# Patient Record
Sex: Female | Born: 1955 | Hispanic: Yes | Marital: Married | Smoking: Former smoker
Health system: Southern US, Community
[De-identification: ages and names within clinical notes are randomized; demographics above are authoritative.]

## PROBLEM LIST (undated history)

## (undated) DIAGNOSIS — C541 Malignant neoplasm of endometrium: Secondary | ICD-10-CM

## (undated) DIAGNOSIS — M1990 Unspecified osteoarthritis, unspecified site: Secondary | ICD-10-CM

## (undated) DIAGNOSIS — R112 Nausea with vomiting, unspecified: Secondary | ICD-10-CM

## (undated) DIAGNOSIS — Z9889 Other specified postprocedural states: Secondary | ICD-10-CM

## (undated) DIAGNOSIS — J45909 Unspecified asthma, uncomplicated: Secondary | ICD-10-CM

## (undated) HISTORY — PX: ABDOMINAL HYSTERECTOMY: SHX81

## (undated) HISTORY — DX: Malignant neoplasm of endometrium: C54.1

## (undated) HISTORY — PX: OTHER SURGICAL HISTORY: SHX169

---

## 2005-01-26 ENCOUNTER — Ambulatory Visit: Payer: Self-pay

## 2007-12-04 ENCOUNTER — Ambulatory Visit: Payer: Self-pay

## 2012-01-23 ENCOUNTER — Ambulatory Visit: Payer: Self-pay

## 2014-08-19 ENCOUNTER — Ambulatory Visit: Payer: Self-pay

## 2014-09-22 ENCOUNTER — Ambulatory Visit: Payer: Self-pay | Admitting: Obstetrics and Gynecology

## 2014-09-23 ENCOUNTER — Ambulatory Visit
Admit: 2014-09-23 | Disposition: A | Discharge status: Home / Self Care | Payer: Self-pay | Attending: Obstetrics and Gynecology | Admitting: Obstetrics and Gynecology

## 2014-09-25 ENCOUNTER — Ambulatory Visit
Admit: 2014-09-25 | Disposition: A | Discharge status: Home / Self Care | Payer: Self-pay | Attending: Obstetrics and Gynecology | Admitting: Obstetrics and Gynecology

## 2014-10-07 ENCOUNTER — Ambulatory Visit
Admit: 2014-10-07 | Disposition: A | Discharge status: Home / Self Care | Payer: Self-pay | Attending: Hematology and Oncology | Admitting: Hematology and Oncology

## 2014-10-28 ENCOUNTER — Other Ambulatory Visit: Payer: Self-pay

## 2014-10-28 ENCOUNTER — Ambulatory Visit
Admission: RE | Admit: 2014-10-28 | Discharge: 2014-10-28 | Disposition: A | Discharge status: Home / Self Care | Payer: Self-pay | Source: Ambulatory Visit | Attending: Obstetrics and Gynecology | Admitting: Obstetrics and Gynecology

## 2014-10-28 ENCOUNTER — Encounter
Admission: RE | Admit: 2014-10-28 | Discharge: 2014-10-28 | Disposition: A | Discharge status: Home / Self Care | Payer: Self-pay | Source: Ambulatory Visit | Attending: Obstetrics and Gynecology | Admitting: Obstetrics and Gynecology

## 2014-10-28 DIAGNOSIS — C541 Malignant neoplasm of endometrium: Secondary | ICD-10-CM | POA: Insufficient documentation

## 2014-10-28 DIAGNOSIS — Z01818 Encounter for other preprocedural examination: Secondary | ICD-10-CM | POA: Insufficient documentation

## 2014-10-28 DIAGNOSIS — Z01812 Encounter for preprocedural laboratory examination: Secondary | ICD-10-CM | POA: Insufficient documentation

## 2014-10-28 HISTORY — DX: Unspecified asthma, uncomplicated: J45.909

## 2014-10-28 HISTORY — DX: Nausea with vomiting, unspecified: R11.2

## 2014-10-28 HISTORY — DX: Other specified postprocedural states: Z98.890

## 2014-10-28 HISTORY — DX: Unspecified osteoarthritis, unspecified site: M19.90

## 2014-10-28 LAB — CBC
HCT: 41.7 % (ref 35.0–47.0)
Hemoglobin: 13.5 g/dL (ref 12.0–16.0)
MCH: 28.6 pg (ref 26.0–34.0)
MCHC: 32.3 g/dL (ref 32.0–36.0)
MCV: 88.3 fL (ref 80.0–100.0)
PLATELETS: 178 10*3/uL (ref 150–440)
RBC: 4.72 MIL/uL (ref 3.80–5.20)
RDW: 14.6 % — AB (ref 11.5–14.5)
WBC: 5.2 10*3/uL (ref 3.6–11.0)

## 2014-10-30 ENCOUNTER — Encounter
Admission: RE | Admit: 2014-10-30 | Discharge: 2014-10-30 | Disposition: A | Discharge status: Home / Self Care | Payer: Self-pay | Source: Ambulatory Visit | Attending: Obstetrics and Gynecology | Admitting: Obstetrics and Gynecology

## 2014-10-30 DIAGNOSIS — C541 Malignant neoplasm of endometrium: Secondary | ICD-10-CM | POA: Insufficient documentation

## 2014-10-30 LAB — BASIC METABOLIC PANEL
Anion gap: 7 (ref 5–15)
BUN: 13 mg/dL (ref 6–20)
CALCIUM: 8.6 mg/dL — AB (ref 8.9–10.3)
CO2: 24 mmol/L (ref 22–32)
Chloride: 107 mmol/L (ref 101–111)
Creatinine, Ser: 0.46 mg/dL (ref 0.44–1.00)
Glucose, Bld: 87 mg/dL (ref 65–99)
Potassium: 3.9 mmol/L (ref 3.5–5.1)
Sodium: 138 mmol/L (ref 135–145)

## 2014-11-02 ENCOUNTER — Inpatient Hospital Stay: Admission: RE | Admit: 2014-11-02 | Payer: Self-pay | Source: Ambulatory Visit

## 2014-11-04 ENCOUNTER — Inpatient Hospital Stay: Payer: Self-pay | Admitting: Certified Registered Nurse Anesthetist

## 2014-11-04 ENCOUNTER — Ambulatory Visit
Admission: RE | Admit: 2014-11-04 | Discharge: 2014-11-04 | DRG: 741 | Disposition: A | Discharge status: Home / Self Care | Payer: Self-pay | Source: Ambulatory Visit | Attending: Obstetrics and Gynecology | Admitting: Obstetrics and Gynecology

## 2014-11-04 ENCOUNTER — Encounter
Admission: RE | Disposition: A | Discharge status: Home / Self Care | Payer: Self-pay | Source: Ambulatory Visit | Attending: Obstetrics and Gynecology

## 2014-11-04 DIAGNOSIS — C541 Malignant neoplasm of endometrium: Secondary | ICD-10-CM | POA: Diagnosis present

## 2014-11-04 DIAGNOSIS — Z90710 Acquired absence of both cervix and uterus: Secondary | ICD-10-CM | POA: Diagnosis not present

## 2014-11-04 HISTORY — PX: LAPAROSCOPIC HYSTERECTOMY: SHX1926

## 2014-11-04 HISTORY — PX: LAPAROSCOPIC BILATERAL SALPINGO OOPHERECTOMY: SHX5890

## 2014-11-04 HISTORY — DX: Malignant neoplasm of endometrium: C54.1

## 2014-11-04 SURGERY — HYSTERECTOMY, TOTAL, LAPAROSCOPIC
Anesthesia: Choice

## 2014-11-04 MED ORDER — PROPOFOL 10 MG/ML IV BOLUS
INTRAVENOUS | Status: DC | PRN
  Administered 2014-11-04: 150 mg via INTRAVENOUS

## 2014-11-04 MED ORDER — FENTANYL CITRATE (PF) 100 MCG/2ML IJ SOLN
25.0000 ug | INTRAMUSCULAR | Status: AC | PRN
  Administered 2014-11-04 (×5): 25 ug via INTRAVENOUS

## 2014-11-04 MED ORDER — PROMETHAZINE HCL 25 MG PO TABS: 25.0000 mg | ORAL_TABLET | Freq: Four times a day (QID) | ORAL | Status: DC | PRN

## 2014-11-04 MED ORDER — FENTANYL CITRATE (PF) 100 MCG/2ML IJ SOLN
INTRAMUSCULAR | Status: AC
  Administered 2014-11-04: 25 ug via INTRAVENOUS
  Filled 2014-11-04: qty 2

## 2014-11-04 MED ORDER — SIMETHICONE 80 MG PO CHEW
80.0000 mg | CHEWABLE_TABLET | Freq: Four times a day (QID) | ORAL | Status: DC | PRN
Start: 2014-11-04 — End: 2014-11-25

## 2014-11-04 MED ORDER — FAMOTIDINE 20 MG PO TABS
20.0000 mg | ORAL_TABLET | Freq: Once | ORAL | Status: AC
  Administered 2014-11-04: 20 mg via ORAL

## 2014-11-04 MED ORDER — CEFAZOLIN SODIUM 1-5 GM-% IV SOLN
INTRAVENOUS | Status: AC
  Filled 2014-11-04: qty 50

## 2014-11-04 MED ORDER — ROCURONIUM BROMIDE 100 MG/10ML IV SOLN
INTRAVENOUS | Status: DC | PRN
  Administered 2014-11-04: 20 mg via INTRAVENOUS
  Administered 2014-11-04: 50 mg via INTRAVENOUS

## 2014-11-04 MED ORDER — HYDROMORPHONE HCL 1 MG/ML IJ SOLN: 0.2500 mg | INTRAMUSCULAR | Status: DC | PRN

## 2014-11-04 MED ORDER — ONDANSETRON HCL 4 MG/2ML IJ SOLN: 4.0000 mg | Freq: Once | INTRAMUSCULAR | Status: DC | PRN

## 2014-11-04 MED ORDER — HYDROMORPHONE HCL 1 MG/ML IJ SOLN
INTRAMUSCULAR | Status: DC | PRN
  Administered 2014-11-04: 1 mg via INTRAVENOUS

## 2014-11-04 MED ORDER — NEOSTIGMINE METHYLSULFATE 10 MG/10ML IV SOLN
INTRAVENOUS | Status: DC | PRN
  Administered 2014-11-04: 4 mg via INTRAVENOUS

## 2014-11-04 MED ORDER — METHYLENE BLUE 1 % INJ SOLN
INTRAMUSCULAR | Status: DC | PRN
  Administered 2014-11-04: 4 mL via SUBMUCOSAL

## 2014-11-04 MED ORDER — FENTANYL CITRATE (PF) 100 MCG/2ML IJ SOLN
INTRAMUSCULAR | Status: AC
  Administered 2014-11-04: 25 ug
  Filled 2014-11-04: qty 2

## 2014-11-04 MED ORDER — KETAMINE HCL 50 MG/ML IJ SOLN
INTRAMUSCULAR | Status: DC | PRN
  Administered 2014-11-04: 50 mg via INTRAMUSCULAR

## 2014-11-04 MED ORDER — MIDAZOLAM HCL 2 MG/2ML IJ SOLN
INTRAMUSCULAR | Status: DC | PRN
  Administered 2014-11-04: 2 mg via INTRAVENOUS

## 2014-11-04 MED ORDER — ACETAMINOPHEN 10 MG/ML IV SOLN
INTRAVENOUS | Status: AC
  Administered 2014-11-04: 1000 mg via INTRAVENOUS
  Filled 2014-11-04: qty 100

## 2014-11-04 MED ORDER — IBUPROFEN 800 MG PO TABS
ORAL_TABLET | ORAL | Status: AC
  Filled 2014-11-04: qty 1

## 2014-11-04 MED ORDER — CEFAZOLIN SODIUM 1-5 GM-% IV SOLN
1.0000 g | INTRAVENOUS | Status: AC
  Administered 2014-11-04: 1 g via INTRAVENOUS

## 2014-11-04 MED ORDER — HYDROCODONE-ACETAMINOPHEN 5-325 MG PO TABS: 1.0000 | ORAL_TABLET | Freq: Four times a day (QID) | ORAL | Status: DC | PRN

## 2014-11-04 MED ORDER — FAMOTIDINE 20 MG PO TABS
ORAL_TABLET | ORAL | Status: AC
  Administered 2014-11-04: 20 mg via ORAL
  Filled 2014-11-04: qty 1

## 2014-11-04 MED ORDER — FENTANYL CITRATE (PF) 100 MCG/2ML IJ SOLN
INTRAMUSCULAR | Status: DC | PRN
  Administered 2014-11-04: 150 ug via INTRAVENOUS
  Administered 2014-11-04: 100 ug via INTRAVENOUS

## 2014-11-04 MED ORDER — LIDOCAINE HCL (CARDIAC) 20 MG/ML IV SOLN
INTRAVENOUS | Status: DC | PRN
  Administered 2014-11-04: 100 mg via INTRAVENOUS

## 2014-11-04 MED ORDER — ONDANSETRON HCL 4 MG/2ML IJ SOLN
INTRAMUSCULAR | Status: DC | PRN
  Administered 2014-11-04: 4 mg via INTRAVENOUS

## 2014-11-04 MED ORDER — METHYLENE BLUE 1 % INJ SOLN
INTRAMUSCULAR | Status: AC
  Filled 2014-11-04: qty 10

## 2014-11-04 MED ORDER — IBUPROFEN 800 MG PO TABS
800.0000 mg | ORAL_TABLET | Freq: Once | ORAL | Status: AC
  Administered 2014-11-04: 800 mg via ORAL

## 2014-11-04 MED ORDER — LACTATED RINGERS IV SOLN
INTRAVENOUS | Status: DC
  Administered 2014-11-04 (×3): via INTRAVENOUS

## 2014-11-04 MED ORDER — IBUPROFEN 800 MG PO TABS: 800.0000 mg | ORAL_TABLET | Freq: Three times a day (TID) | ORAL | Status: DC | PRN

## 2014-11-04 MED ORDER — GLYCOPYRROLATE 0.2 MG/ML IJ SOLN
INTRAMUSCULAR | Status: DC | PRN
  Administered 2014-11-04: .7 mg via INTRAVENOUS

## 2014-11-04 MED ORDER — DEXAMETHASONE SODIUM PHOSPHATE 4 MG/ML IJ SOLN
INTRAMUSCULAR | Status: DC | PRN
  Administered 2014-11-04: 5 mg via INTRAVENOUS

## 2014-11-04 SURGICAL SUPPLY — 88 items
APPLICATOR SURGIFLO (MISCELLANEOUS) IMPLANT
BAG URO DRAIN 2000ML W/SPOUT (MISCELLANEOUS) ×4 IMPLANT
BLADE SURG 15 STRL SS SAFETY (BLADE) IMPLANT
BLADE SURG SZ11 CARB STEEL (BLADE) ×4 IMPLANT
CANISTER SUCT 1200ML W/VALVE (MISCELLANEOUS) ×4 IMPLANT
CANNULA DILATOR  5MM W/SLV (CANNULA) ×2
CANNULA DILATOR 5 W/SLV (CANNULA) ×6 IMPLANT
CATH FOLEY 2WAY  5CC 16FR (CATHETERS) ×2
CATH TRAY 16F METER LATEX (MISCELLANEOUS) IMPLANT
CATH URTH 16FR FL 2W BLN LF (CATHETERS) ×2 IMPLANT
CHLORAPREP W/TINT 26ML (MISCELLANEOUS) ×4 IMPLANT
CNTNR SPEC 2.5X3XGRAD LEK (MISCELLANEOUS) ×2
CONT SPEC 4OZ STER OR WHT (MISCELLANEOUS) ×2
CONTAINER SPEC 2.5X3XGRAD LEK (MISCELLANEOUS) ×2 IMPLANT
CORD MONOPOLAR M/FML 12FT (MISCELLANEOUS) ×4 IMPLANT
DEFOGGER SCOPE WARMER CLEARIFY (MISCELLANEOUS) ×4 IMPLANT
DEVICE SUTURE ENDOST 10MM (ENDOMECHANICALS) ×4 IMPLANT
DRAPE LAP W/FLUID (DRAPES) ×4 IMPLANT
DRAPE LAPAROTOMY 100X77 ABD (DRAPES) ×4 IMPLANT
DRAPE LAPAROTOMY TRNSV 106X77 (MISCELLANEOUS) IMPLANT
DRAPE PERI LITHO V/GYN (MISCELLANEOUS) IMPLANT
DRAPE UNDER BUTTOCK W/FLU (DRAPES) ×4 IMPLANT
DRSG TEGADERM 2-3/8X2-3/4 SM (GAUZE/BANDAGES/DRESSINGS) ×16 IMPLANT
DRSG TELFA 3X8 NADH (GAUZE/BANDAGES/DRESSINGS) ×4 IMPLANT
ELECT BLADE 6 FLAT ULTRCLN (ELECTRODE) IMPLANT
ELECT CAUTERY BLADE 6.4 (BLADE) IMPLANT
GAUZE SPONGE 4X4 12PLY STRL (GAUZE/BANDAGES/DRESSINGS) IMPLANT
GAUZE SPONGE NON-WVN 2X2 STRL (MISCELLANEOUS) IMPLANT
GLOVE BIO SURGEON STRL SZ8 (GLOVE) ×16 IMPLANT
GLOVE INDICATOR 8.0 STRL GRN (GLOVE) ×12 IMPLANT
GOWN STRL REUS W/ TWL LRG LVL3 (GOWN DISPOSABLE) ×4 IMPLANT
GOWN STRL REUS W/ TWL XL LVL3 (GOWN DISPOSABLE) ×2 IMPLANT
GOWN STRL REUS W/TWL LRG LVL3 (GOWN DISPOSABLE) ×4
GOWN STRL REUS W/TWL XL LVL3 (GOWN DISPOSABLE) ×2
GRASPER SUT TROCAR 14GX15 (MISCELLANEOUS) ×4 IMPLANT
IRRIGATION STRYKERFLOW (MISCELLANEOUS) ×2 IMPLANT
IRRIGATOR STRYKERFLOW (MISCELLANEOUS) ×4
IV LACTATED RINGERS 1000ML (IV SOLUTION) IMPLANT
KIT RM TURNOVER CYSTO AR (KITS) ×4 IMPLANT
LABEL OR SOLS (LABEL) IMPLANT
LIGASURE BLUNT 5MM 37CM (INSTRUMENTS) ×4 IMPLANT
LIGASURE IMPACT 36 18CM CVD LR (INSTRUMENTS) ×4 IMPLANT
LIQUID BAND (GAUZE/BANDAGES/DRESSINGS) ×4 IMPLANT
MANIPULATOR VCARE LG CRV RETR (MISCELLANEOUS) IMPLANT
MANIPULATOR VCARE STD CRV RETR (MISCELLANEOUS) ×4 IMPLANT
NDL INSUFF 14G 150MM VS150000 (NEEDLE) ×4 IMPLANT
NDL INSUFF ACCESS 14 VERSASTEP (NEEDLE) ×4 IMPLANT
NEEDLE SPNL 22GX5 LNG QUINC BK (NEEDLE) ×4 IMPLANT
NEEDLE VERESS 14GA 120MM (NEEDLE) ×4 IMPLANT
NS IRRIG 1000ML POUR BTL (IV SOLUTION) IMPLANT
NS IRRIG 500ML POUR BTL (IV SOLUTION) ×4 IMPLANT
OCCLUDER COLPOPNEUMO (BALLOONS) ×4 IMPLANT
PACK BASIN MAJOR ARMC (MISCELLANEOUS) IMPLANT
PACK GYN LAPAROSCOPIC (MISCELLANEOUS) ×4 IMPLANT
PAD GROUND ADULT SPLIT (MISCELLANEOUS) ×4 IMPLANT
PAD OB MATERNITY 4.3X12.25 (PERSONAL CARE ITEMS) ×4 IMPLANT
PAD PREP 24X41 OB/GYN DISP (PERSONAL CARE ITEMS) ×4 IMPLANT
PAD TRENDELENBURG OR TABLE (MISCELLANEOUS) ×4 IMPLANT
SCISSORS METZENBAUM CVD 33 (INSTRUMENTS) ×4 IMPLANT
SET CYSTO W/LG BORE CLAMP LF (SET/KITS/TRAYS/PACK) IMPLANT
SHEARS ENDO 5MM LNG  ASK BEFOR (MISCELLANEOUS) ×2
SHEARS ENDO 5MM LNG ASK BEFOR (MISCELLANEOUS) ×2 IMPLANT
SLEEVE ENDOPATH XCEL 5M (ENDOMECHANICALS) ×4 IMPLANT
SPOGE SURGIFLO 8M (HEMOSTASIS)
SPONGE LAP 18X18 5 PK (GAUZE/BANDAGES/DRESSINGS) IMPLANT
SPONGE LAP 4X18 5PK (MISCELLANEOUS) IMPLANT
SPONGE SURGIFLO 8M (HEMOSTASIS) IMPLANT
SPONGE VERSALON 2X2 STRL (MISCELLANEOUS)
SPONGE XRAY 4X4 16PLY STRL (MISCELLANEOUS) ×4 IMPLANT
STAPLER SKIN PROX 35W (STAPLE) IMPLANT
SUT ENDO VLOC 180-0-8IN (SUTURE) ×8 IMPLANT
SUT MAXON ABS #0 GS21 30IN (SUTURE) IMPLANT
SUT PDS AB 1 TP1 96 (SUTURE) IMPLANT
SUT PLAIN 2 0 XLH (SUTURE) IMPLANT
SUT VIC AB 0 CT1 27 (SUTURE)
SUT VIC AB 0 CT1 27XCR 8 STRN (SUTURE) IMPLANT
SUT VIC AB 0 CT1 36 (SUTURE) IMPLANT
SUT VIC AB 2-0 UR6 27 (SUTURE) ×4 IMPLANT
SUT VICRYL AB 3-0 FS1 BRD 27IN (SUTURE) IMPLANT
SYR 3ML LL SCALE MARK (SYRINGE) IMPLANT
SYR 50ML LL SCALE MARK (SYRINGE) ×4 IMPLANT
SYR BULB IRRIG 60ML STRL (SYRINGE) ×4 IMPLANT
SYRINGE 10CC LL (SYRINGE) ×8 IMPLANT
TRAY PREP VAG/GEN (MISCELLANEOUS) ×4 IMPLANT
TROCAR ENDO BLADELESS 11MM (ENDOMECHANICALS) ×4 IMPLANT
TROCAR VERSASTEP 12M LG PL (TROCAR) ×4 IMPLANT
TROCAR XCEL NON-BLD 5MMX100MML (ENDOMECHANICALS) ×4 IMPLANT
TUBING INSUFFLATOR HEATED (MISCELLANEOUS) ×4 IMPLANT

## 2014-11-04 NOTE — Anesthesia Preprocedure Evaluation (Addendum)
Anesthesia Evaluation    Airway Mallampati: II       Dental   Pulmonary former smoker,          Cardiovascular Rhythm:Regular     Neuro/Psych    GI/Hepatic   Endo/Other    Renal/GU      Musculoskeletal   Abdominal   Peds  Hematology   Anesthesia Other Findings   Reproductive/Obstetrics                            Anesthesia Physical Anesthesia Plan  ASA: II  Anesthesia Plan: General   Post-op Pain Management:    Induction: Intravenous  Airway Management Planned: Oral ETT  Additional Equipment:   Intra-op Plan:   Post-operative Plan:   Informed Consent: I have reviewed the patients History and Physical, chart, labs and discussed the procedure including the risks, benefits and alternatives for the proposed anesthesia with the patient or authorized representative who has indicated his/her understanding and acceptance.     Plan Discussed with: CRNA  Anesthesia Plan Comments:         Anesthesia Quick Evaluation

## 2014-11-04 NOTE — Anesthesia Postprocedure Evaluation (Signed)
  Anesthesia Post-op Note  Patient: Elizabeth Davenport  Procedure(s) Performed: Procedure(s): HYSTERECTOMY TOTAL LAPAROSCOPIC (N/A) LAPAROSCOPIC BILATERAL SALPINGO OOPHORECTOMY (Bilateral)  Anesthesia type:No value filed.  Patient location: PACU  Post pain: Pain level controlled  Post assessment: Post-op Vital signs reviewed, Patient's Cardiovascular Status Stable, Respiratory Function Stable, Patent Airway and No signs of Nausea or vomiting  Post vital signs: Reviewed and stable  Last Vitals:  Filed Vitals:   11/04/14 1427  BP: 144/80  Pulse: 74  Temp:   Resp: 16    Level of consciousness: awake, alert  and patient cooperative  Complications: No apparent anesthesia complications

## 2014-11-04 NOTE — H&P (Signed)
59 year old G76P2 Hispanic female with two month history of light post menopausal bleeding. 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  CT scan 09/22/14 - negative for metastatic disease.  Cervix normal on exam.   Pathology reviewed at [facility] and was felt to represent a well-differentiated adenocarcinoma FIGO grade 1. There was no evidence of cervical adenocarcinoma in situ or squamous dysplasia identified. ER positive/PR negative and patchy staining for p16. In situ hybridization studies were negative for HPV subtypes 16 and 18. The final assessment favored an endometrial origin.  The patient is a Sales promotion account executive Witness and absolutely refuses transfusion; she is willing to accept clotting factors such as FloSeal to control bleeding as well as albumin and cryoprecipitate.  I had a long discussion with the patient and her daughter with an interpreter about the small potential risk of life threatening bleeding.  She affirms that she would rather die than receive a life saving blood transfusion.  I told them that we would likely not do sentinel lymph node sampling as there is no evidence that this improves the chance of cure of the cancer, and the nodes are located in close approximation to the iliac blood vessels.   Pre-op CBC and BMP are acceptable for surgery and she is not anemic at baseline.  Plan is for TLH/BSO possible SLN mapping and biopsy and the patient is in agreement with proceeding. Mellody Drown, MD

## 2014-11-04 NOTE — Transfer of Care (Signed)
Immediate Anesthesia Transfer of Care Note  Patient: Elizabeth Davenport  Procedure(s) Performed: Procedure(s): HYSTERECTOMY TOTAL LAPAROSCOPIC (N/A) LAPAROSCOPIC BILATERAL SALPINGO OOPHORECTOMY (Bilateral)  Patient Location: PACU  Anesthesia Type:General  Level of Consciousness: awake, alert  and oriented  Airway & Oxygen Therapy: Patient Spontanous Breathing and Patient connected to nasal cannula oxygen  Post-op Assessment: Report given to RN and Post -op Vital signs reviewed and stable  Post vital signs: Reviewed and stable  Last Vitals:  Filed Vitals:   11/04/14 1426  BP: 144/80  Pulse:   Temp: 36.6 C  Resp: 18    Complications: No apparent anesthesia complications

## 2014-11-04 NOTE — Op Note (Signed)
DATE: 11/04/14  PRE-OP DIAGNOSIS: Grade 1 endometrial cancer   POST-OP DIAGNOSIS: Grade 1 endometrial cancer  SURGEON: Rubie Maid, MD  ASSISTANT: Mellody Drown, MD  ANESTHESIA: general ET  PROCEDURE: Total laparoscopic hysterectomy, bilateral salpingoophorectomy, pelvic washings   ESTIMATED BLOOD LOSS: 50 ml  DRAINS: none  TOTAL IV FLUIDS: per Anesthesia  SPECIMENS: uterus, tubes and ovaries, pelvic washings  COMPLICATIONS: none  DISPOSITION: discharge to home  CONDITION: stable  INDICATIONS: Grade 1 endometrial cancer  FINDINGS: There was a small anterior uterine fibroid.  The left tube and ovary were normal.  The right ovary had a 7 mm surface excrescence concerning for metastatic disease.  The right tube was normal.  Survey of the rest of the abdomen, including the omentum and right diaphragm was negative. Lymphatic mapping was negative on the left, and on the right a channel was seen but no blue nodes.  There were no pathologically enlarged nodes.  In view of her being a Jehovah's witness, we did not perform a lymph node dissection, and this had been discussed with the patient and her daughter prior to surgery.      PROCEDURE IN DETAIL: After informed consent was obtained, the patient was taken to the operating room where general endotracheal anesthesia was performed without difficulty. The patient was positioned in the dorsal lithotomy position in [place] and the usual precautions taken with her arms tucked bilaterally. She was prepped and draped in normal sterile fashion. Time-out was performed and a Foley catheter was placed into the bladder.  Two ml of methylene blue was placed in the cervix at 3 and 9 o'clock for sentinel lymph node mapping.  A standard V-Care uterine manipulator was placed without incident and a pneum-occluder balloon was also placed.  An open Hasson technique was used to place an infraumbilical 29-UT baloon trocar under direct visualization. The  laparoscope was introduced and CO2 gas was infused for pneumoperitoneum to a pressure of 15 mm Hg.  Right and left lateral 5-mm ports and a 5-12 mm suprapubic port were placed under direct visualization of the laparoscope using an EndoStep technique.  Cytologic washings were obtained.  The patient was placed in Trendelenburg and the bowel was displaced up into the upper abdomen.  Round ligaments were then divided on each side with the Ligasure and the retroperitoneal space was opened bilaterally. The ureters were identified and preserved. The infundibulopelvic ligaments were skeletonized and divided with the Ligasure. The bladder flap was created with electrocautery and the bladder was dissected down off the lower uterine segment and cervix. The uterine arteries were then skeletonized bilaterally and secured and divided with the Ligasure and dropped down below the level of the Vcare cup.  The endoshears were then used to transect the vagina on top of the rim of the V-care device.  The vaginal cuff was then closed with the Endostitch using a running closure with the V lock suture.  A few sponges were placed in the abdomen to mop up blood and then were removed.  The intraperitoneal pressure was dropped and all pedicles felt to be hemostatic. The suprapubic port was then closed with the [employer] device using a figure of eight of 0 Vicryl.  The trochars were removed and the umbilical port site was closed with a figure of eight of 2-0 Vicryl.  The skin of all four port sites was closed with glue.    The patient tolerated the procedure well. Sponge, lap, needle and instrument counts were correct x2  at the end of the procedure.  She was awakened and extubated and taken to the recovery room in good condition.  Dr Marcelline Mates and I both were involved in the entire case.  Mellody Drown, MD

## 2014-11-04 NOTE — Discharge Instructions (Addendum)
?  1) Follow up in 1 week at [facility] for incision check, 4 weeks at [facility].  2) Seek medical attention for persistent bright red bleeding, fever greater than 100.4 degrees, severe abdominal/pelvic pain, dysuria, nausea or vomiting, or foul smellnig vaginal discharge.  3) There is a possibility of having light vaginal bleeding or discharge for 2-3 weeks.  4) Pelvic rest: avoid anything in the vagina (sex/douching/tampons) for 3 weeks.

## 2014-11-08 ENCOUNTER — Encounter: Payer: Self-pay | Admitting: Obstetrics and Gynecology

## 2014-11-10 LAB — CYTOLOGY - NON PAP

## 2014-11-10 LAB — SURGICAL PATHOLOGY

## 2014-11-25 ENCOUNTER — Encounter (INDEPENDENT_AMBULATORY_CARE_PROVIDER_SITE_OTHER): Payer: Self-pay

## 2014-11-25 ENCOUNTER — Inpatient Hospital Stay: Payer: Self-pay | Attending: Obstetrics and Gynecology | Admitting: Obstetrics and Gynecology

## 2014-11-25 ENCOUNTER — Inpatient Hospital Stay (HOSPITAL_BASED_OUTPATIENT_CLINIC_OR_DEPARTMENT_OTHER): Payer: Self-pay | Admitting: Oncology

## 2014-11-25 VITALS — BP 125/91 | HR 74 | Temp 97.0°F | Resp 18 | Ht 62.0 in | Wt 200.0 lb

## 2014-11-25 DIAGNOSIS — Z87891 Personal history of nicotine dependence: Secondary | ICD-10-CM

## 2014-11-25 DIAGNOSIS — Z90710 Acquired absence of both cervix and uterus: Secondary | ICD-10-CM

## 2014-11-25 DIAGNOSIS — N839 Noninflammatory disorder of ovary, fallopian tube and broad ligament, unspecified: Secondary | ICD-10-CM

## 2014-11-25 DIAGNOSIS — M129 Arthropathy, unspecified: Secondary | ICD-10-CM | POA: Insufficient documentation

## 2014-11-25 DIAGNOSIS — C541 Malignant neoplasm of endometrium: Secondary | ICD-10-CM | POA: Insufficient documentation

## 2014-11-25 DIAGNOSIS — C562 Malignant neoplasm of left ovary: Secondary | ICD-10-CM

## 2014-11-25 DIAGNOSIS — J45909 Unspecified asthma, uncomplicated: Secondary | ICD-10-CM

## 2014-11-25 DIAGNOSIS — C569 Malignant neoplasm of unspecified ovary: Secondary | ICD-10-CM | POA: Insufficient documentation

## 2014-11-25 DIAGNOSIS — Z79899 Other long term (current) drug therapy: Secondary | ICD-10-CM | POA: Insufficient documentation

## 2014-11-25 DIAGNOSIS — C561 Malignant neoplasm of right ovary: Secondary | ICD-10-CM

## 2014-11-25 DIAGNOSIS — C563 Malignant neoplasm of bilateral ovaries: Secondary | ICD-10-CM

## 2014-11-25 NOTE — Patient Instructions (Addendum)
No intercourse or heavy lifting for two months after surgery.  You will be on lifting restrictions for two months.

## 2014-11-25 NOTE — Progress Notes (Signed)
Gynecologic Oncology Interval Note  Chief Complaint: Post op visit for uterine and ovarian cancer.  Subjective:   She has no complaints today.  Normal GI/GU function. Accompanied by daughter and interpreter.  Oncology history: Elizabeth Davenport is a 59 y.o. G2P2 Hispanic woman who developed light vaginal bleeding in early 2016 after undergoing LMP/menopause in 2014. Dr DeFrancesco did PAP that was normal.  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  CT scan 09/22/14 abd/pelvis negative for metastatic disease.  She was referred to [place] and on exam the cervix was normal.  Slides were reviewed at [facility] and read as probable endometrial primary.  She underwent TLH/BSO Nov 04, 2014 at [facility].  Right ovarian excresence noted at surgery.  Survey of the rest of the abdomen, including the omentum and right diaphragm was negative.  Further staging not performed due to patient's refusal to accept blood products as she is a Jehovah's witness.  Path showed 2.6 cm grade 1 endometrial cancer without LVSI or cervical involvement.  The ovaries both were found to have low grade serous cancers of 7 and 8 mm.  No spread noted, but washings were positive.  UTERUS, CERVIX, BILATERAL FALLOPIAN TUBES AND OVARIES; TOTAL  LAPAROSCOPIC HYSTERECTOMY AND BILATERAL SALPINGO-OOPHORECTOMY:  - WELL-DIFFERENTIATED ENDOMETRIOID CARCINOMA (FIGO I).  - BILATERAL OVARIES WITH LOW-GRADE SEROUS CARCINOMA.  - FALLOPIAN TUBES NEGATIVE FOR MALIGNANCY.  UTERINE TUMOR  Histologic Type:  Endometrioid adenocarcinoma, not otherwise  characterized  Histologic Grade:  FIGO grade 1  Tumor Size: 2.6cm  Myometrial Invasion:   Present  Depth of Myometrial Invasion: 33mm  Myometrial Thickness (mm):  55mm  Involvement of Cervix:  Not involved  Lymph-Vascular Invasion: Not identified   OVARIAN TUMOR Histologic Type:  Serous, carcinoma  Histologic Grade  World Health Organization  [facility]) Grading System:  G2: Moderately differentiated  Two-Tier Grading System: Low grade  Tumor Size:  Right Ovary Tumor Size. Greatest dimension (cm) 0.8cm  Left Ovary Tumor Size. Greatest dimension (cm) 0.7cm  Ovarian Surface Involvement: Present  Lymph-Vascular Invasion: Not identified   Oncology History:  Problem List: Patient Active Problem List   Diagnosis Date Noted  . Endometrial cancer 11/04/2014  . S/P laparoscopic hysterectomy 11/04/2014    Past Medical History: Past Medical History  Diagnosis Date  . Asthma   . Arthritis     left knee  . PONV (postoperative nausea and vomiting)     Past Surgical History: Past Surgical History  Procedure Laterality Date  . Removed mass from thyriod    . Laparoscopic hysterectomy N/A 11/04/2014    Procedure: HYSTERECTOMY TOTAL LAPAROSCOPIC;  Surgeon: Mellody Drown, MD;  Location: [facility] ORS;  Service: Gynecology;  Laterality: N/A;  . Laparoscopic bilateral salpingo oopherectomy Bilateral 11/04/2014    Procedure: LAPAROSCOPIC BILATERAL SALPINGO OOPHORECTOMY;  Surgeon: Mellody Drown, MD;  Location: [facility] ORS;  Service: Gynecology;  Laterality: Bilateral;    Family History: No family history on file.  Social History: History   Social History  . Marital Status: Married    Spouse Name: N/A  . Number of Children: N/A  . Years of Education: N/A   Occupational History  . Not on file.   Social History Main Topics  . Smoking status: Former Smoker    Quit date: 06/29/1990  . Smokeless tobacco: Never Used  . Alcohol Use: Yes     Comment: once a year  . Drug Use: No  . Sexual Activity: Not on file   Other Topics  Concern  . Not on file   Social History Narrative    Allergies: No Known Allergies  Current Medications: Current Outpatient Prescriptions  Medication Sig Dispense Refill  . albuterol (PROVENTIL HFA;VENTOLIN HFA) 108 (90 BASE) MCG/ACT inhaler Inhale 2 puffs into the lungs every 6 (six) hours as needed for  wheezing or shortness of breath.    Marland Kitchen HYDROcodone-acetaminophen (NORCO) 5-325 MG per tablet Take 1-2 tablets by mouth every 6 (six) hours as needed for moderate pain. 30 tablet 0  . ibuprofen (ADVIL,MOTRIN) 800 MG tablet Take 1 tablet (800 mg total) by mouth every 8 (eight) hours as needed. 30 tablet 0  . promethazine (PHENERGAN) 25 MG tablet Take 1 tablet (25 mg total) by mouth every 6 (six) hours as needed for nausea or vomiting. 30 tablet 0  . simethicone (GAS-X) 80 MG chewable tablet Chew 1 tablet (80 mg total) by mouth 4 (four) times daily as needed for flatulence. 60 tablet 1   No current facility-administered medications for this visit.    Review of Systems Pertinent items are noted in HPI.  Objective:  Physical Examination:  BP 125/91 mmHg  Pulse 74  Temp(Src) 97 F (36.1 C) (Tympanic)  Resp 18  Ht 5\' 2"  (1.575 m)  Wt 200 lb (90.719 kg)  BMI 36.57 kg/m2  SpO2 100%  LMP 11/04/2011   General appearance: alert, cooperative and appears stated age   Abdomen: laparoscopy incisions well healed.   Pelvic: deferred  Lab Review:   No results found for: CA125 Lab Results  Component Value Date   WBC 5.2 10/28/2014   HGB 13.5 10/28/2014   HCT 41.7 10/28/2014   MCV 88.3 10/28/2014   PLT 178 10/28/2014   Lab Results  Component Value Date   NA 138 10/30/2014   K 3.9 10/30/2014   CL 107 10/30/2014   CO2 24 10/30/2014   Lab Results  Component Value Date   CREATININE 0.46 10/30/2014   Lab Results  Component Value Date   BUN 13 10/30/2014   No results found for: ALT, AST, GGT, ALKPHOS, BILITOT  Radiologic Imaging:  No procedure found.  Performance status 0 - Asymptomatic  Assessment:  Elizabeth Davenport is a 59 y.o. 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  Plan:   Problem List Items Addressed This Visit    Endometrial cancer - Primary    Ovarian cancer, bilateral      In view of the path report suggesting primary endometrial and uterine cancers I would like to have the pathology reviewed at [place] to confirm this, as it would be much more common to have uterine cancer metastatic to the ovaries than dual primaries.  If there are two primaries, the uterine cancer would not require additional therapy, but for the low grade serous ovarian cancer we would recommend a course of carboplatin/taxol followed by maintainence hormonal therapy with an aromatase inhibitor.  If she has metastatic endometrial cancer in the ovaries, we would likely recommend a combination of carbo/taxol and radiation.   Suggested return to clinic in  3 weeks to see Dr Oliva Bustard and I for consideration of adjuvant therapy.   Based on final pathology reading from [facility] we may want to consider genetic testing.  Instructions: no heavy lifting and no intercourse for 2 months post op.   Mellody Drown, MD  CC:  Dr. Rubie Maid, Dr Forest Gleason

## 2014-11-27 ENCOUNTER — Encounter: Payer: Self-pay | Admitting: Oncology

## 2014-11-27 NOTE — Progress Notes (Signed)
[place] @ [facility] [phone]  Fax:([phone]) [place] OB: 10-01-1955  MR#: [id number]  IZT#:[id number]  Patient Care Team: No Pcp Per Patient as PCP - General (General Practice)  CHIEF COMPLAINT: No chief complaint on file.   VISIT DIAGNOSIS:  No diagnosis found.   Oncology History   She has no complaints today. Normal GI/GU function. Accompanied by daughter and interpreter.  Oncology history: Elizabeth Davenport is a 60 y.o. G2P2 Hispanic woman who developed light vaginal bleeding in early 2016 after undergoing LMP/menopause in 2014. Dr DeFrancesco did PAP that was normal. 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 CT scan 09/22/14 abd/pelvis negative for metastatic disease. She was referred to [facility] and on exam the cervix was normal. Slides were reviewed at [facility] and read as probable endometrial primary. She underwent TLH/BSO Nov 04, 2014 at [facility]. Right ovarian excresence noted at surgery. Survey of the rest of the abdomen, including the omentum and right diaphragm was negative. Further staging not performed due to patient's refusal to accept blood products as she is a Jehovah's witness. Path showed 2.6 cm grade 1 endometrial cancer without LVSI or cervical involvement. The ovaries both were found to have low grade serous cancers of 7 and 8 mm. No spread noted, but washings were positive.  UTERUS, CERVIX, BILATERAL FALLOPIAN TUBES AND OVARIES; TOTAL  LAPAROSCOPIC HYSTERECTOMY AND BILATERAL SALPINGO-OOPHORECTOMY:  - WELL-DIFFERENTIATED ENDOMETRIOID CARCINOMA (FIGO I).  - BILATERAL OVARIES WITH LOW-GRADE SEROUS CARCINOMA.  - FALLOPIAN TUBES NEGATIVE FOR MALIGNANCY.  UTERINE TUMOR  Histologic Type:  Endometrioid adenocarcinoma, not otherwise  characterized  Histologic Grade:  FIGO grade 1  Tumor Size: 2.6cm  Myometrial Invasion:   Present  Depth of Myometrial Invasion:  37mm  Myometrial Thickness (mm):  21mm  Involvement of Cervix:  Not involved  Lymph-Vascular Invasion: Not identified   OVARIAN TUMOR Histologic Type:  Serous, carcinoma  Histologic Grade  World Health Organization [facility]) Grading System:  G2: Moderately differentiated  Two-Tier Grading System: Low grade  Tumor Size:  Right Ovary Tumor Size. Greatest dimension (cm) 0.8cm  Left Ovary Tumor Size. Greatest dimension (cm) 0.7cm  Ovarian Surface Involvement: Present  Lymph-Vascular Invasion: Not identified   Oncology History:  Problem List: Patient Active Problem List   Diagnosis Date Noted  . Endometrial cancer 11/04/2014  . S/P laparoscopic hysterectomy 11/04/2014            Ovarian cancer, bilateral   11/25/2014 Initial Diagnosis Ovarian cancer, bilateral    Oncology Flowsheet 11/04/2014  dexamethasone (DECADRON) IJ -  ondansetron (ZOFRAN) IV -    INTERVAL HISTORY:  59 year old lady came today for further follow-up regarding carcinoma of ovary, bilateral with positive washings and endometrial cancer.  Patient underwent bilateral oophorectomy and hysterectomy. Patient is also being seen by her GYN oncologist No abdominal pain.  No nausea.  No vomiting.  No diarrhea. History has been obtained through interpreter  REVIEW OF SYSTEMS:    GENERAL:  Feels good.  Active.  No fevers, sweats or weight loss. PERFORMANCE STATUS (ECOG):  0 HEENT:  No visual changes, runny nose, sore throat, mouth sores or tenderness. Lungs: No shortness of breath or cough.  No hemoptysis. Cardiac:  No chest pain, palpitations, orthopnea, or PND. GI:  No nausea, vomiting, diarrhea, constipation, melena or hematochezia. GU:  No urgency, frequency, dysuria, or hematuria. Musculoskeletal:  No back pain.  No joint pain.  No muscle tenderness. Extremities:  No pain  or swelling. Skin:  No rashes or skin changes. Neuro:  No headache, numbness or weakness, balance or coordination  issues. Endocrine:  No diabetes, thyroid issues, hot flashes or night sweats. Psych:  No mood changes, depression or anxiety. Pain:  No focal pain. Review of systems:  All other systems reviewed and found to be negative. As per HPI. Otherwise, a complete review of systems is negatve.  PAST MEDICAL HISTORY: Past Medical History  Diagnosis Date  . Asthma   . Arthritis     left knee  . PONV (postoperative nausea and vomiting)   . Endometrial cancer 11/04/14  . Ovarian cancer 11/04/14    low grade serous    PAST SURGICAL HISTORY: Past Surgical History  Procedure Laterality Date  . Removed mass from thyriod    . Laparoscopic hysterectomy N/A 11/04/2014    Procedure: HYSTERECTOMY TOTAL LAPAROSCOPIC;  Surgeon: Mellody Drown, MD;  Location: [facility] ORS;  Service: Gynecology;  Laterality: N/A;  . Laparoscopic bilateral salpingo oopherectomy Bilateral 11/04/2014    Procedure: LAPAROSCOPIC BILATERAL SALPINGO OOPHORECTOMY;  Surgeon: Mellody Drown, MD;  Location: [facility] ORS;  Service: Gynecology;  Laterality: Bilateral;    FAMILY HISTORY Family History  Problem Relation Age of Onset  . Breast cancer Sister 93        ADVANCED DIRECTIVES: Patient does not have any advanced healthcare directive. Information has been given.   HEALTH MAINTENANCE: History  Substance Use Topics  . Smoking status: Former Smoker -- 8 years    Quit date: 06/29/1990  . Smokeless tobacco: Never Used     Comment: Smoking History Cigarettes 1 cig per day; quit 22 years ago; smoked for 8 years  . Alcohol Use: 0.0 oz/week    0 Standard drinks or equivalent per week     Comment: once a year    :  No Known Allergies  Current Outpatient Prescriptions  Medication Sig Dispense Refill  . albuterol (PROVENTIL HFA;VENTOLIN HFA) 108 (90 BASE) MCG/ACT inhaler Inhale 2 puffs into the lungs every 6 (six) hours as needed for wheezing or shortness of breath.     No current facility-administered medications for this  visit.    OBJECTIVE: PHYSICAL EXAM: General  status: Performance status is good.  Patient has not lost significant weight HEENT: No evidence of stomatitis. Sclera and conjunctivae :: No jaundice.   pale looking. Lungs: Air  entry equal on both sides.  No rhonchi.  No rales.  Cardiac: Heart sounds are normal.  No pericardial rub.  No murmur. Lymphatic system: Cervical, axillary, inguinal, lymph nodes not palpable GI: Abdomen is soft.  No ascites.  Liver spleen not palpable.  No tenderness.  Bowel sounds are within normal limit Abdominal wound is healed. Lower extremity: No edema Neurological system: Higher functions, cranial nerves intact no evidence of peripheral neuropathy. Skin: No rash.  No ecchymosis..  There were no vitals filed for this visit.   There is no weight on file to calculate BMI.    ECOG FS:0 - Asymptomatic  LAB RESULTS:  No visits with results within 2 Day(s) from this visit. Latest known visit with results is:  Admission on 11/04/2014, Discharged on 11/04/2014  Component Date Value Ref Range Status  . CYTOLOGY - NON GYN 11/04/2014    Final                   Value:Cytology - Non PAP CASE: [id number] PATIENT: Elizabeth Davenport Non-Gyn Cytology Report     SPECIMEN SUBMITTED: A. Washing, pelvic  CLINICAL  HISTORY: None provided  PRE-OPERATIVE DIAGNOSIS: Endometrial Cancer; Endometrial  Cancer  POST-OPERATIVE DIAGNOSIS:      DIAGNOSIS: A. PELVIC WASHINGS; THINPREP, CYTOSPIN AND CELL BLOCK: - POSITIVE FOR MALIGNANCY, CARCINOMA PRESENT.  Note This interpretation includes evaluation of a ThinPrep slide, cell block and separately submitted specimen [id number].   GROSS DESCRIPTION:  A. Specimen Labeled: pelvic washings Volume: approximate 50 mL Description: cloudy yellow Cell block(s): 1 and ThinPrep Final Diagnosis performed by Delorse Lek, MD.  Electronically signed 11/10/2014 12:33:32PM    The electronic signature indicates that the named  Attending Pathologist has evaluated the specimen  Technical component performed at [facility], [address] [place], [place] [address] Lab: [phone] Dir: Darrick Penna. Evette Doffing, MD                           Professional component performed at [facility], [facility], [place], [place],  [address] Lab: [phone] Dir: Dellia Nims. Reuel Derby, MD    . SURGICAL PATHOLOGY 11/04/2014    Final                   Value:Surgical Pathology CASE: [id number] PATIENT: Elizabeth Davenport Surgical Pathology Report     SPECIMEN SUBMITTED: A. Uterus, cervix, Bilateral tubes and ovaries  CLINICAL HISTORY: None provided  PRE-OPERATIVE DIAGNOSIS: Endometrial cancer  POST-OPERATIVE DIAGNOSIS: Endometrial cancer     DIAGNOSIS: A. UTERUS, CERVIX, BILATERAL FALLOPIAN TUBES AND OVARIES; TOTAL LAPAROSCOPIC HYSTERECTOMY AND BILATERAL SALPINGO-OOPHORECTOMY: - WELL-DIFFERENTIATED ENDOMETRIOID CARCINOMA (FIGO I). - BILATERAL OVARIES WITH LOW-GRADE SEROUS CARCINOMA. - FALLOPIAN TUBES NEGATIVE FOR MALIGNANCY. - LEIOMYOMATA WITH PARTIAL HYALINIZATION, UP TO 2.8 CM WITHOUT NECROSIS, ATYPIA OR INCREASED MITOSES. - SEE CANCER CASE SUMMARIES BELOW.  ENDOMETRIUM: Hysterectomy, With or Without Other Organs or Tissues Specimens InvolvedA: Uterus, cervix, Bilateral tubes and ovaries  Endometrium, Hysterectomy, With or Without Other Organs or Tissues Cancer Case Summary Specimen: Uterine corpus SPE                         CIMEN Procedure Simple hysterectomy Additional Procedures:   Bilateral salpingo-oophorectomy Lymph Node Sampling:     Not performed Specimen Integrity: Intact hysterectomy specimen TUMOR Histologic Type:    Endometrioid adenocarcinoma, not otherwise characterized Histologic Grade:   FIGO grade 1 EXTENT Tumor Size:    Greatest dimension (cm) 2.6cm Myometrial Invasion:     Present Depth of Myometrial Invasion: Specify depth of invasion (mm) 65mm Myometrial  Thickness (mm):    9mm Involvement of Cervix:   Not involved Other Organs Submitted:  Right ovary Not involved Left ovary Not involved Right fallopian tube Not involved Left fallopian tube Not involved ACCESSORY FINDINGS Lymph-Vascular Invasion: Not identified Not identified STAGE (pTNM [FIGO]) TNM Descriptors:    n/a Primary Tumor (pT): pT1a [IA]:  Tumor limited to endometrium or invades less than one-half of the myometrium Regional Lymph Nodes (pN) pNX: Cannot be assessed Pelvic Lymph Nodes: No pelv                         ic nodes submitted or found Para-aortic Lymph Nodes: No para-aortic nodes submitted or found Distant Metastasis (pM): Not applicable n/a   OVARY: Oophorectomy, Salpingo-Oophorectomy, Subtotal Oophorectomy or Removal of Tumor in Fragments, Hysterectomy with Salpingo-Oophorectomy Specimens InvolvedA: Uterus, cervix, Bilateral tubes and ovaries  Ovary, Oophorectomy, Salpingo-Oophorectomy, Subtotal Oophorectomy or Removal of Tumor in Fragments, Hysterectomy with Salpingo-Oophorectomy Cancer Case Summary Specimen: Ovary SPECIMEN Procedure:  Bilateral salpingo-oophorectomy Lymph Node Sampling:     Not performed Specimen Integrity Right ovary:   Capsule intact Left ovary:    Capsule intact Primary Tumor Site: Bilateral ovarian TUMOR Histologic Type:    Serous, carcinoma Histologic Grade World Health Organization (WHO) Grading System:   G2: Moderately differentiated Two-Tier Grading System: Low grade EXTENT Tumor Size:    Right Ovary Tumor Size Greatest dimensio                         n (cm) 0.8cm Left Ovary Tumor Size Greatest dimension (cm) 0.7cm Ovarian Surface Involvement:  Present Organs Submitted:   Right ovary Involved Left ovary Involved Right fallopian tube Not involved Left fallopian tube Not involved Uterus Not involved Cervix Not involved ACCESSORY FINDINGS Lymph-Vascular Invasion: Not identified STAGE (pTNM  [FIGO]) TNM Descriptors:    m (multiple primary tumors) Primary Tumor (pT): pT1c [IC]: Tumor limited to one or both ovaries with any of the following: capsule ruptured, tumor on ovarian surface, malignant cells in ascites or peritoneal washings Regional Lymph Nodes (pN) pNX: Cannot be assessed No nodes submitted or found Distant Metastasis (pM): Not applicable n/a   Note Pelvic washings were separately submitted as [id number] and are positive for carcinoma. The results of this case were discussed with Dr. Fransisca Connors on 11/10/14 by Dr. Luana Shu.   GROSS DESCRIPTION:  A. Labeled: uterus, cervix, bilateral tubes and ova                         ries Adnexa: bilateral tubes and ovaries attached Weight: 122 grams Shape: received intact, misshapened and the anterior aspect and there is a 0.3 x 0.3 x 0.1 cm slightly raised nodule on the posterior fundus Dimensions:      Height: 6.2 cm      Anterior to posterior: 4.7 Cm      Breadth at fundus: 6.3 cm Serosa: purple tan with a subserosal nodule at the fundus Cervix: Ectocervix: 3.2 x 3.0 cm with a 0.5 cm os Endocervix: trabecular tan Length of endocervical canal: 3.1 cm  Endometrium:      Length of endometrial cavity: 3.4 cm      Width of endometrial cavity at fundus: 2.5 cm      Tumor findings:           Dimensions: 2.6 x 1.5 x 0.5 cm           Appearance: friable tan to red           Location and extent: anterior endometrium, 3.6 cm from the external os      Myometrial invasion: 0.3 cm, less than one third  Thickness of myometrial wall at deepest gross invasion: 1.4 cm  Other findings and comments: and anterior 2.8 x 2.8 x 2.5 cm intramural nodule and                          multiple smaller white whorled nodules  Adnexa:      Right ovary:           Dimensions: grossly intact 2.5 x 1.8 x 1.3 cm           External surface: lobulated pink-tan with a 1.1 x 0.8 x 0.6 cm soft tan excrescence           Cut surface: heterogeneous  purple tan      Right fallopian tube:  Dimensions: 4.6 cm long by 1 cm in diameter           Other findings: fimbriated with multiple paratubal cyst      Left ovary:           Dimensions: intact, 2.4 x 2.0 x 1.1 cm           External surface: lobulated pink-tan with a 0.6 x 0.5 x 0.1 cm soft tan excrescence           Cut surface: heterogeneous pink-tan      Left fallopian tube:           Dimensions: fimbriated, 5.2 cm long by 0.9 cm in diameter           Other findings: fimbriated  Lymph nodes: not grossly identified  Other comments: uterus inked blue on anterior and black on posterior aspect and ovaries inked blue  Block summary: 1-2-anterior cervix and lower uterine segment bisected 3-4-posterior cer                         vix and lower uterine segment bisected 5-8-representative anterior endomyometrium from upper corpus toward fundus respectively 9-10-representative posterior endomyometrium and entire slightly raised nodule 11-representative largest white whorled nodule anterior aspect 12-13-representative right ovary with entire excrescent area 14-representative right fallopian tube cross-sections 15-right fallopian tube fimbriated and bisected 16-representative left fallopian tube cross-sections 17-left fallopian tube fimbriated end bisected 18-representative left ovary with entire excrescent area 19-21-remaining anterior endometrial tumor 22-remaining right fallopian tube 23-remaining left fallopian Final Diagnosis performed by Delorse Lek, MD.  Electronically signed 11/10/2014 1:21:56PM    The electronic signature indicates that the named Attending Pathologist has evaluated the specimen  Technical component performed at [facility], [address] [place], [place] [address] Lab: [phone]                         [place]: Darrick Penna. Evette Doffing, MD  Professional component performed at [facility], [facility], [place], [place], [place]  [address] Lab: [phone] Dir: Dellia Nims. Rubinas, MD       STUDIES: Dg Chest 2 View  10/29/2014   CLINICAL DATA:  Endometrial carcinoma  EXAM: CHEST  2 VIEW  COMPARISON:  None.  FINDINGS: Normal heart size. Right perihilar subsegmental atelectasis. Otherwise clear lungs. No pneumothorax or pleural effusion.  IMPRESSION: No active cardiopulmonary disease.   Electronically Signed   By: Marybelle Killings M.D.   On: 10/29/2014 08:16    ASSESSMENT:  Carcinoma of endometrium stage I Carcinoma over the bilateral with positive pelvic washing stage I  PLAN: \ As discussed with GYN oncologist Review pathology at [facility] Depending on that decide whether patient needs carboplatinum Taxol plus minus radiation. Question to be answered by pathologist I will be dealing with 2 primaries Already have carcinoma of endometrium metastases to both ovaries Patient will come back and see [place] in 3 weeks to decide on further treatment options   Patient expressed understanding and was in agreement with this plan. She also understands that She can call clinic at any time with any questions, concerns, or complaints.    Endometrial cancer   Staging form: Corpus Uteri - Carcinoma, AJCC 7th Edition     Clinical: Stage I (T1, N0, M0) - Unsigned Ovarian cancer, bilateral   Staging form: Ovary, AJCC 7th Edition     Clinical: Stage Unknown (T1c, NX, M0) - Signed by Forest Gleason,  MD on 11/27/2014   Forest Gleason, MD   11/27/2014 7:51 AM

## 2014-12-16 ENCOUNTER — Ambulatory Visit: Payer: Self-pay

## 2014-12-22 ENCOUNTER — Telehealth: Payer: Self-pay | Admitting: *Deleted

## 2014-12-22 NOTE — Telephone Encounter (Signed)
Final pathology has been reviewed by [facility].  Per Md. "Findings were most consistent with an endometrioid carcinoma of the endometrium and separate serous borderline tumors involving the surface of both ovaries." Based on this report, Dr. Fransisca Connors "is not recommending any additional chemotherapy or treatment." Dr. Oliva Bustard was made aware.

## 2014-12-23 NOTE — Telephone Encounter (Signed)
-----   Message from [place] sent at 12/23/2014  8:42 AM EDT ----- Regarding: RE: need pt to come next week to see Dr. Fransisca Connors. 7/6 @ 10:30  Thanks  ----- Message -----    From: Sabino Gasser, RN    Sent: 12/22/2014   9:15 AM      To: [place] Subject: need pt to come next week to see Dr. Eldred Manges  Please schedule patient to see Dr. Fransisca Connors next week. Please schedule an interpreter as well.  I will try to call the patient today to let her know an update on the final pathology.  Can you let me know the time of the appointment and then I will give this information to her all @ one time. Thanks.-Guido Comp J

## 2014-12-30 ENCOUNTER — Encounter: Payer: Self-pay | Admitting: Obstetrics and Gynecology

## 2014-12-30 ENCOUNTER — Inpatient Hospital Stay: Payer: Self-pay | Attending: Obstetrics and Gynecology | Admitting: Obstetrics and Gynecology

## 2014-12-30 VITALS — BP 130/82 | HR 68 | Temp 97.8°F | Resp 18 | Ht 62.0 in | Wt 205.0 lb

## 2014-12-30 DIAGNOSIS — C541 Malignant neoplasm of endometrium: Secondary | ICD-10-CM

## 2014-12-30 DIAGNOSIS — C563 Malignant neoplasm of bilateral ovaries: Secondary | ICD-10-CM

## 2014-12-30 DIAGNOSIS — Z9889 Other specified postprocedural states: Secondary | ICD-10-CM

## 2014-12-30 DIAGNOSIS — C561 Malignant neoplasm of right ovary: Secondary | ICD-10-CM

## 2014-12-30 DIAGNOSIS — C562 Malignant neoplasm of left ovary: Secondary | ICD-10-CM

## 2014-12-30 NOTE — Progress Notes (Signed)
Gynecologic Oncology Interval Note  Chief Complaint: Post op visit for uterine cancer and bilateral ovarian borderline tumor excrescences.  Subjective:   She has no complaints today.  Normal GI/GU function. Accompanied by interpreter.  In view of the path report suggesting primary endometrial and uterine cancers I has the pathology reviewed at [facility].  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  However, the ovarian excresences that were read as low grade serous carcinoma were interpreted as benign serous borderline tumors by the [facility] Pathologists (several of them reviewed the case).   A. Uterus, bilateral ovaries and fallopian tubes, hysterectomy and bilateral salpingo-oophorectomy (Outside slide review, [id number], [facility], [place], [place]. Date of procedure 11-04-14):  Carcinoma of the endometrium:  Histologic type: Endometrioid adenocarcinoma.  FIGO grade: 1 of 3.  Tumor size: 2.6 cm, per report.  Maximum depth of myometrial invasion: 0.2 cm, in a 1.4 cm thick wall.  Lymphatic/vascular invasion: Not identified. Remaining myometrium: Leiomyomata. Cervix: Free of tumor. Serosa: Free of tumor. Specimen margins: not involved.  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 Fallopian tubes, right and left: Free of tumor. Lymph nodes: none identified in this specimen. Note: The tumors in the endometrium and the ovaries are morphologically distinct, with extensive papillary architecture in the serous borderline tumors. The endometrial tumor is positive for vimentin, negative for WT-1, focally positive for ER, and negative for PR, consistent with an endometrioid endometrial carcinoma. The ovarian tumor has a similar profile. It is somewhat unusual for a serous borderline tumor to be negative for WT-1, but when the morphologic and immunohistochemical findings  are considered together, we feel the ovarian tumors are best interpreted as primary serous borderline tumors, distinct from the endometrial tumor. The pathologic stage for the endometrial tumor is pT1a Nx Mx. The pathologic stage for the ovarian serous borderline tumors is pT1c Nx Mx. Key parts of this case were reviewed with Dr. Telford Nab and Dr. Maxie Better.  Oncology history: Elizabeth Davenport is a 59 y.o. G2P2 Hispanic woman who developed light vaginal bleeding in early 2016 after undergoing LMP/menopause in 2014. Dr DeFrancesco did PAP that was normal.  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  CT scan 09/22/14 abd/pelvis negative for metastatic disease.  She was referred to [place] and on exam the cervix was normal.  Slides were reviewed at [facility] and read as probable endometrial primary.  She underwent TLH/BSO Nov 04, 2014 at [facility].  Right ovarian excresence noted at surgery.  Survey of the rest of the abdomen, including the omentum and right diaphragm was negative.  Further staging not performed due to patient's refusal to accept blood products as she is a Jehovah's witness.  Path showed 2.6 cm grade 1 endometrial cancer without LVSI or cervical involvement.  The ovaries both were found to have low grade serous cancers of 7 and 8 mm.  No spread noted, but washings were positive.  [facility] Path report UTERUS, CERVIX, BILATERAL FALLOPIAN TUBES AND OVARIES; TOTAL LAPAROSCOPIC HYSTERECTOMY AND BILATERAL SALPINGO-OOPHORECTOMY: - WELL-DIFFERENTIATED ENDOMETRIOID CARCINOMA (FIGO I).  - BILATERAL OVARIES WITH LOW-GRADE SEROUS CARCINOMA. - FALLOPIAN TUBES NEGATIVE FOR MALIGNANCY.  UTERINE TUMOR  Histologic Type:  Endometrioid adenocarcinoma, not otherwise  characterized  Histologic Grade:  FIGO grade 1  Tumor Size: 2.6cm  Myometrial Invasion:   Present  Depth of Myometrial Invasion: 32mm  Myometrial Thickness (mm):  70mm  Involvement of Cervix:  Not  involved  Lymph-Vascular Invasion: Not identified   OVARIAN TUMOR Histologic Type:  Serous, carcinoma  Histologic Grade  [facility]) Grading System:  G2: Moderately differentiated  Two-Tier Grading System: Low grade  Tumor Size:  Right Ovary Tumor Size. Greatest dimension (cm) 0.8cm  Left Ovary Tumor Size. Greatest dimension (cm) 0.7cm  Ovarian Surface Involvement: Present  Lymph-Vascular Invasion: Not identified   Oncology History:  Problem List: Patient Active Problem List   Diagnosis Date Noted  . Ovarian cancer, bilateral 11/25/2014  . Endometrial cancer 11/04/2014  . S/P laparoscopic hysterectomy 11/04/2014    Past Medical History: Past Medical History  Diagnosis Date  . Asthma   . Arthritis     left knee  . PONV (postoperative nausea and vomiting)   . Endometrial cancer 11/04/14  . Ovarian cancer 11/04/14    low grade serous    Past Surgical History: Past Surgical History  Procedure Laterality Date  . Removed mass from thyriod    . Laparoscopic hysterectomy N/A 11/04/2014    Procedure: HYSTERECTOMY TOTAL LAPAROSCOPIC;  Surgeon: Mellody Drown, MD;  Location: [facility] ORS;  Service: Gynecology;  Laterality: N/A;  . Laparoscopic bilateral salpingo oopherectomy Bilateral 11/04/2014    Procedure: LAPAROSCOPIC BILATERAL SALPINGO OOPHORECTOMY;  Surgeon: Mellody Drown, MD;  Location: [facility] ORS;  Service: Gynecology;  Laterality: Bilateral;    Family History: Family History  Problem Relation Age of Onset  . Breast cancer Sister 40    Social History: History   Social History  . Marital Status: Married    Spouse Name: N/A  . Number of Children: N/A  . Years of Education: N/A   Occupational History  . Not on file.   Social History Main Topics  . Smoking status: Former Smoker -- 8 years    Quit date: 06/29/1990  . Smokeless tobacco: Never Used     Comment: Smoking History Cigarettes 1 cig per day; quit 22 years ago; smoked for 8 years   . Alcohol Use: 0.0 oz/week    0 Standard drinks or equivalent per week     Comment: once a year  . Drug Use: No  . Sexual Activity: Not on file   Other Topics Concern  . Not on file   Social History Narrative    Allergies: No Known Allergies  Current Medications: Current Outpatient Prescriptions  Medication Sig Dispense Refill  . albuterol (PROVENTIL HFA;VENTOLIN HFA) 108 (90 BASE) MCG/ACT inhaler Inhale 2 puffs into the lungs every 6 (six) hours as needed for wheezing or shortness of breath.     No current facility-administered medications for this visit.    Review of Systems Pertinent items are noted in HPI.  Objective:  Physical Examination:  Filed Vitals:   12/30/14 1052  BP: 130/82  Pulse: 68  Temp: 97.8 F (36.6 C)  Resp: 18   ECOG Performance Status: 0 - Asymptomatic  General appearance: alert, cooperative and appears stated age HEENT:PERRLA, extra ocular movement intact, neck supple with midline trachea and thyroid without masses Lymph node survey: negative Cardiovascular: normal rate and rhythm Respiratory: clear to auscultation Breast exam: deferred Abdomen: soft, non-tender, without masses or organomegaly, no hernias and well healed incisions Back: inspection of back is normal Extremities: extremities normal, atraumatic, no cyanosis or edema Skin exam: normal coloration and turgor, no rashes, no suspicious skin lesions noted. Neurological exam reveals: no focal findings or movement disorder noted. Abdomen: laparoscopy incisions well healed.   Pelvic: EGBUS/Vagina:Cuff well healed.Bimanual: no masses or tenderness.  Assessment:  Elizabeth Davenport is a 59 y.o. 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 Normal post-op exam.  Jehovah's witness.  Plan:   Problem List Items Addressed This Visit    Endometrial cancer - Primary    Ovarian cancer, bilateral      No further therapy is indicated for her primary endometrial cancer.  In view of the [facility] path report showing borderline tumors in the ovaries (as opposed to the [facility] report that showed grade 1 cancer) no further therapy is needed for these lesions either.  Since these were excresences rather than cystic tumors and she had positive washings, we will follow CA125 levels.       She may resume intercourse after 2 months post op.RTC in 4 months or sooner should the need arise.    Mellody Drown, MD  CC:  Dr. Rubie Maid

## 2015-05-05 ENCOUNTER — Inpatient Hospital Stay: Payer: Self-pay

## 2015-05-05 ENCOUNTER — Encounter: Payer: Self-pay | Admitting: Obstetrics and Gynecology

## 2015-05-05 ENCOUNTER — Ambulatory Visit: Payer: Self-pay

## 2015-05-05 ENCOUNTER — Inpatient Hospital Stay: Payer: Self-pay | Attending: Obstetrics and Gynecology | Admitting: Obstetrics and Gynecology

## 2015-05-05 VITALS — BP 128/87 | HR 69 | Temp 98.7°F | Ht 62.0 in | Wt 214.3 lb

## 2015-05-05 DIAGNOSIS — E669 Obesity, unspecified: Secondary | ICD-10-CM

## 2015-05-05 DIAGNOSIS — C563 Malignant neoplasm of bilateral ovaries: Secondary | ICD-10-CM

## 2015-05-05 DIAGNOSIS — C562 Malignant neoplasm of left ovary: Secondary | ICD-10-CM | POA: Insufficient documentation

## 2015-05-05 DIAGNOSIS — Z8542 Personal history of malignant neoplasm of other parts of uterus: Secondary | ICD-10-CM

## 2015-05-05 DIAGNOSIS — Z90722 Acquired absence of ovaries, bilateral: Secondary | ICD-10-CM | POA: Insufficient documentation

## 2015-05-05 DIAGNOSIS — Z6839 Body mass index (BMI) 39.0-39.9, adult: Secondary | ICD-10-CM

## 2015-05-05 DIAGNOSIS — C541 Malignant neoplasm of endometrium: Secondary | ICD-10-CM | POA: Insufficient documentation

## 2015-05-05 DIAGNOSIS — C561 Malignant neoplasm of right ovary: Secondary | ICD-10-CM | POA: Insufficient documentation

## 2015-05-05 DIAGNOSIS — D3910 Neoplasm of uncertain behavior of unspecified ovary: Secondary | ICD-10-CM

## 2015-05-05 DIAGNOSIS — C801 Malignant (primary) neoplasm, unspecified: Secondary | ICD-10-CM

## 2015-05-05 DIAGNOSIS — Z87891 Personal history of nicotine dependence: Secondary | ICD-10-CM

## 2015-05-05 DIAGNOSIS — Z90710 Acquired absence of both cervix and uterus: Secondary | ICD-10-CM

## 2015-05-05 NOTE — Progress Notes (Signed)
Assisted MD with Pelvic exam.

## 2015-05-05 NOTE — Progress Notes (Signed)
Gynecologic Oncology Interval Note  Chief Complaint: Post op visit for uterine cancer and bilateral ovarian borderline tumor excrescences.  Subjective:   She has no complaints today.  Normal GI/GU function. Accompanied by interpreter. Her CA125 is pending.    Oncology history: Elizabeth Davenport is a 59 y.o. G2P2 Hispanic woman who developed light vaginal bleeding in early 2016 after undergoing LMP/menopause in 2014. Dr DeFrancesco did PAP that was normal.  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  CT scan 09/22/14 abd/pelvis negative for metastatic disease.  She was referred to [place] and on exam the cervix was normal.  Slides were reviewed at [facility] and read as probable endometrial primary.  She underwent TLH/BSO Nov 04, 2014 at [facility].  Right ovarian excresence noted at surgery.  Survey of the rest of the abdomen, including the omentum and right diaphragm was negative.  Further staging not performed due to patient's refusal to accept blood products as she is a Jehovah's witness.  Path at [facility] showed 2.6 cm grade 1 endometrial cancer without LVSI or cervical involvement.  The ovaries both were found to have low grade serous cancers of 7 and 8 mm.  No spread noted, but washings were positive. [facility] review was obtained and confirmed   In view of the path report suggesting primary endometrial and uterine cancers I has the pathology reviewed at [facility].  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  However, the ovarian excresences that were read as low grade serous carcinoma were interpreted as benign serous borderline tumors by the [facility] Pathologists (several of them reviewed the case). The recommendation was for surveillance.   Oncology History:  Problem List: Patient Active Problem List   Diagnosis Date Noted  . Ovarian cancer, bilateral ([place]) 11/25/2014  . Endometrial cancer ([place]) 11/04/2014  .  S/P laparoscopic hysterectomy 11/04/2014    Past Medical History: Past Medical History  Diagnosis Date  . Asthma   . Arthritis     left knee  . PONV (postoperative nausea and vomiting)   . Endometrial cancer 11/04/14    Past Surgical History: Past Surgical History  Procedure Laterality Date  . Removed mass from thyriod    . Laparoscopic hysterectomy N/A 11/04/2014    Procedure: HYSTERECTOMY TOTAL LAPAROSCOPIC;  Surgeon: Mellody Drown, MD;  Location: [facility] ORS;  Service: Gynecology;  Laterality: N/A;  . Laparoscopic bilateral salpingo oopherectomy Bilateral 11/04/2014    Procedure: LAPAROSCOPIC BILATERAL SALPINGO OOPHORECTOMY;  Surgeon: Mellody Drown, MD;  Location: [facility] ORS;  Service: Gynecology;  Laterality: Bilateral;    Family History: Family History  Problem Relation Age of Onset  . Breast cancer Sister 84    Social History: Social History   Social History  . Marital Status: Married    Spouse Name: N/A  . Number of Children: N/A  . Years of Education: N/A   Occupational History  . Not on file.   Social History Main Topics  . Smoking status: Former Smoker -- 8 years    Quit date: 06/29/1990  . Smokeless tobacco: Never Used     Comment: Smoking History Cigarettes 1 cig per day; quit 22 years ago; smoked for 8 years  . Alcohol Use: 0.0 oz/week    0 Standard drinks or equivalent per week     Comment: once a year  . Drug Use: No  . Sexual Activity:    Partners: Male   Other Topics Concern  .  Not on file   Social History Narrative    Allergies: No Known Allergies  Current Medications: Current Outpatient Prescriptions  Medication Sig Dispense Refill  . albuterol (PROVENTIL HFA;VENTOLIN HFA) 108 (90 BASE) MCG/ACT inhaler Inhale 2 puffs into the lungs every 6 (six) hours as needed for wheezing or shortness of breath.     No current facility-administered medications for this visit.    Review of Systems General: no complaints  HEENT: no complaints   Lungs: no complaints  Cardiac: no complaints  GI: no complaints  GU: no complaints  Musculoskeletal: no complaints  Extremities: no complaints  Skin: no complaints  Neuro: no complaints  Endocrine: no complaints  Psych: no complaints      Objective:  Physical Examination:  Filed Vitals:   05/05/15 1531  BP: 128/87  Pulse: 69  Temp: 98.7 F (37.1 C)    Body mass index is 39.18 kg/(m^2).  ECOG Performance Status: 0 - Asymptomatic  General appearance: alert, cooperative and appears stated age HEENT:PERRLA, extra ocular movement intact Lymph node survey: negative Cardiovascular: normal rate and rhythm Respiratory: clear to auscultation Breast exam: deferred Abdomen: soft, non-tender, without masses or organomegaly, no hernias and well healed incisions Back: inspection of back is normal Extremities: extremities normal, atraumatic, no cyanosis or edema Skin exam: normal coloration and turgor, no rashes, no suspicious skin lesions noted. Neurological exam reveals: no focal findings or movement disorder noted. Abdomen: laparoscopy incisions well healed.   Pelvic: EGBUS/Vagina:Cuff well healed.Bimanual: no masses or tenderness. RV deferred.   Assessment:  Elizabeth Davenport is a 59 y.o. 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 NED. Obesity.   Jehovah's witness.  Plan:   Problem List Items Addressed This Visit      Genitourinary   Endometrial cancer ([place])    Other Visit Diagnoses    Cancer ([place])    -  Primary    Relevant Orders    CA 125    Ovarian tumor of borderline malignancy, unspecified laterality        Obesity           We discussed surveillance and survivorship issues.    I have recommended continued close follow up with exams, including pelvic exams every 3-6 months for 2-3 years, then every 6-12 months for 3-5 years and then  annually thereafter. CA125 will be obtained for surveillance of borderline tumor. Imaging and laboratory assessment is based on clinical indication. Patient education for obesity, lifestyle, exercise, nutrition. We discussed her weight and need for weight loss, stressed good nutrition, and exercise. She is exercising 4 days a week and I asked her to increase to 5. I provided information regarding calorie counting and exercise for weight loss. I offered referral to the CARE program and provided a pamphlet today.     Gillis Ends, MD  CC:  Dr. Rubie Maid

## 2015-05-05 NOTE — Patient Instructions (Signed)
Recuento de caloras para bajar de peso (Calorie Counting for [employer] Loss) Las caloras son energa que se obtiene de lo que se come y se bebe. El organismo [place] esta energa para mantenerlo Curator. La cantidad de caloras que come tiene incidencia Lub[employer] Corporation. Cuando come ms caloras de las que el cuerpo necesita, este acumula las caloras extra [place]. Cuando come Universal Health de las que el cuerpo [place], este quema grasa para obtener la energa que requiere. El recuento de caloras es el registro de la cantidad de caloras que come y Pharmacologist. Si se asegura de comer menos caloras de las que el cuerpo necesita, debe bajar de [place]. Para que el recuento de caloras funcione, tendr que comer la cantidad de caloras adecuadas para usted en un da, para bajar una cantidad de peso saludable por semana. Una cantidad de peso saludable para bajar por semana suele ser [place] 1 y Ivar Drape (0,5 a 0,9kg). Un nutricionista puede determinar la cantidad de caloras que necesita por da y sugerirle cmo alcanzar su objetivo calrico.   QU DEBO SABER ACERCA DEL [place]? A fin de alcanzar su objetivo diario de caloras, tendr que:  Averiguar cuntas caloras hay en cada alimento que le Therapist, occupational. Intente hacerlo antes de comer.  Decida la cantidad que puede comer del alimento.  Anote lo que comi y cuntas caloras tena. Esta tarea se conoce como llevar un registro de comidas. DNDE ENCUENTRO INFORMACIN SOBRE LAS CALORAS? Es posible Animator cantidad de caloras que contiene un alimento en la etiqueta de informacin nutricional. Tenga en cuenta que toda la informacin que se incluye en una etiqueta se basa en una porcin especfica del alimento. Si un alimento no tiene una etiqueta de informacin nutricional, intente buscar las caloras en Internet o pida ayuda al nutricionista. CMO DECIDO CUNTO COMER? Para decidir qu cantidad del alimento puede comer,  tendr que tener en cuenta el nmero de caloras en una porcin y el tamao de una porcin. Es posible Financial trader en la etiqueta de informacin nutricional. Si un alimento no tiene una etiqueta de informacin nutricional, busque los [place] en Internet o pida ayuda al nutricionista. Recuerde que las caloras se calculan por porcin. Si opta por comer ms de una porcin de un alimento, tendr Tenne[employer] Inc las caloras por porcin por la cantidad de porciones que planea comer. Por ejemplo, la etiqueta de un envase de pan puede decir que el tamao de una porcin es [place], y que una porcin tiene 90caloras. Si come 1rodaja, habr comido 90caloras. Si come 2rodajas, habr comido 180caloras. Soldier? Despus de cada comida, registre la siguiente informacin en el registro de comidas:  Lo que comi.  La cantidad que comi.  La cantidad de caloras que tena.  Luego, sume las caloras. Tenga a Materials engineer de comidas, por ejemplo, en un anotador de bolsillo. Otra alternativa es usar una aplicacin en el telfono mvil o un sitio web. Algunos programas calcularn las caloras y [employer] la cantidad de caloras que le [place], [place] vez que agregue un alimento al Control and instrumentation engineer. CULES SON ALGUNOS CONSEJOS PARA EL [place]?  Use las caloras de los alimentos y las bebidas que lo sacien y no lo dejen con apetito, por ejemplo, frutos secos y Engineer, mining de frutos secos, verduras, protenas magras y alimentos con alto contenido de fibra (ms de 5g de fibra por porcin).  Coma alimentos nutritivos y  evite las caloras vacas. Las caloras vacas son aquellas que se obtienen de los alimentos o las bebidas que no contienen muchos nutrientes, como los dulces y los refrescos. Es mejor comer una comida nutritiva altamente calrica (como un aguacate) que una con pocos nutrientes (como una bolsa de patatas fritas).  Sepa cuntas caloras tienen los  alimentos que come con ms frecuencia. eBay, no tiene que buscar este dato cada vez que los come.  Est atento a los Chiropodist hipocalricos, pero que en realidad contienen muchas caloras, como los productos de [place], los refrescos y los dulces sin Lobbyist.  Preste atencin a las [employer] Data, [employer] refrescos, las bebidas a base de [place], las bebidas con alcohol y los jugos, que contienen muchas caloras, pero no le dan saciedad. Opte por las bebidas bajas en caloras, como el agua y las bebidas dietticas.  Concntrese en tratar de contar las caloras de los alimentos que tienen la mayor cantidad de caloras. Registrar las caloras de una ensalada que solo contiene hortalizas es menos importante que calcular las de un batido de [place].  Encuentre un mtodo para controlar las caloras que funcione para usted. Sea creativo. La State Farm de las personas que alcanzan el xito encuentran mtodos para llevar un registro de cunto comen en un da, incluso si no cuentan cada calora. CULES SON ALGUNOS CONSEJOS PARA CONTROLAR LAS PORCIONES?  Sepa cuntas caloras hay en una porcin. Esto lo ayudar a saber cuntas porciones de un alimento determinado puede comer.  Use una taza medidora para medir los Northrop Grumman, lo que es muy til al principio. Con el tiempo, podr hacer un clculo estimativo de los tamaos de las porciones de algunos alimentos.  Dedique tiempo a poner porciones de diferentes alimentos en sus platos, tazones y tazas predilectos, a fin de saber cmo se ve una porcin.  Intente no comer directamente de [place] bolsa o una caja, ya que esto puede llevarlo a comer en exceso. Ponga la cantidad Land [employer]Lakes gustara comer en una taza o un plato, a fin de asegurarse de que est comiendo la porcin correcta.  Use platos, vasos y tazones ms pequeos para no comer en exceso. Esta es una forma rpida y sencilla de poner en prctica el control de las  porciones. Si el plato es ms pequeo, le caben menos alimentos.  Intente no hacer muchas tareas mientras come, como ver televisin o usar la computadora. Si es la hora de comer, sintese a Conservation officer, nature y disfrute de Environmental education officer. Esto lo ayudar a que empiece a Marine scientist cundo est satisfecho. Tambin le permitir estar ms consciente de lo que come y cunto est comiendo. [place]?  Pida porciones ms pequeas o porciones para nios.  Considere la posibilidad de Publishing rights manager un plato principal y las guarniciones, en lugar de pedir su propio plato principal.  Si pide su propio plato principal, coma solo la mitad. Pida una caja al comienzo de la comida y ponga all el resto del plato principal, para no sentir la tentacin de comerlo.  Busque las caloras en el men. Si se detallan las caloras, elija las opciones que contengan la menor cantidad.  Elija platos que incluyan verduras, frutas, cereales integrales, productos lcteos con bajo contenido de grasa y Advertising account planner. Centrarse en elegir con inteligencia alimentos de cada uno de los 5grupos que puede ayudarlo a seguir por el buen camino en los restaurantes.  Opte por los alimentos hervidos, asados, cocidos a la parrilla o al vapor.  Elija el agua, la [place] t helado sin azcar u otras bebidas que no contengan azcares agregados. Si desea una bebida alcohlica, escoja una opcin con menos caloras. Por ejemplo, un margarita normal puede [employer] Sherwin[employer]Williams, y un vaso de vino tiene unas 150.  No coma alimentos que contengan mantequilla, estn empanados, fritos o que se sirvan con salsa a base de crema. Generalmente, los alimentos que se etiquetan como "crujientes" estn fritos, a menos que se indique lo contrario.  Ordene los Kimberly-Clark, las salsas y los jarabes aparte, ya que suelen tener muchas caloras; por lo tanto, no los consuma en grandes cantidades.  Tenga cuidado con las  [place]. Muchas personas piensan que las ensaladas son Ardelia Mems opcin saludable, pero en muchas cosas, esto no es as. Hay muchas ensaladas que contienen tocino, pollo frito, grandes cantidades de [place], patatas fritas y Lexicographer. Todos estos productos son altamente calricos. Si desea Katherine Mantle, elija una de hortalizas y pida carnes a la parrilla o un filete. Ordene el aderezo aparte o pida aceite de [place] y vinagre o limn para Haematologist.  Haga un clculo estimativo de la cantidad de porciones que le sirven. Por ejemplo, una porcin de arroz cocido equivale a media taza o tiene el tamao aproximado de un molde de [place], o de media pelota de tenis. Conocer el tamao de las porciones lo ayudar a Personnel officer atento a la cantidad de comida que come Occidental Petroleum. La lista que sigue le [place] el tamao de algunas porciones comunes a partir de objetos cotidianos.  1onza (28g) = 4dados apilados.  3onzas (85g) = 33mazo de cartas.  1cucharadita = 1dado.  1cucharada = media pelota de tenis de mesa.  2cucharadas = 1pelota de tenis de mesa.  Media taza = 1pelota de tenis o 8molde de magdalena.  1taza = 1 pelota de bisbol.   Esta informacin no tiene Marine scientist el consejo del mdico. Asegrese de hacerle al mdico cualquier pregunta que tenga.   Document Released: 09/28/2008 Document Revised: 07/03/2014 Elsevier Interactive Patient Education 2016 Guy ejercicio para Pharmacist, hospital (Exercising to [employer]) Hacer ejercicio puede ayudarlo a Sports coach de peso. Para bajar de Universal Health ejercicio, este debe ser de intensidad vigorosa. Puede saber que est haciendo ejercicio de intensidad vigorosa si respira con mucha dificultad y rapidez, y no puede mantener una conversacin. El ejercicio de intensidad moderada ayuda a Contractor peso actual. Puede saber que est haciendo ejercicio de intensidad moderada si tiene una frecuencia cardaca ms elevada y [place]  respiracin ms rpida, pero an puede Programmer, systems. [place]? Elija una actividad que disfrute y establezca objetivos realistas. El mdico puede ayudarlo a Paediatric nurse un plan de actividades que funcione para usted. Haga ejercicio regularmente como se lo haya indicado el mdico. Esta puede incluir:  Neurosurgeon de resistencia dos veces por semana, como:  Flexiones de [place].  Abdominales.  Levantamiento de pesas.  Ejercicios con bandas elsticas.  Realizar una intensidad determinada de ejercicio durante una cantidad determinada de [place]. Elija entre estas opciones:  136minutos de ejercicio de intensidad moderada cada semana.  94minutos de ejercicio de intensidad vigorosa cada semana.  [employer] de ejercicio de intensidad moderada y vigorosa cada semana. Los nios, las mujeres [place], las personas que no estn en forma, las personas con sobrepeso y los adultos mayores tal vez tengan que  consultar a un mdico para que les d Glass blower/designer. Si tiene [employer], asegrese de Teacher, adult education al mdico antes de comenzar un programa de ejercicios nuevo. [place]?   Caminar a un ritmo de al menos 4,71millas (7kilmetros) por hora.  Trotar o correr a un ritmo de 5 millas (8 kilmetros) por hora.  Andar en bicicleta a un ritmo de al menos 10 millas (16 kilmetros) por hora.  Practicar natacin.  Practicar patinaje sobre ruedas normales o en lnea.  Hacer esqu de fondo.  Hacer deportes competitivos vigorosos, como ftbol americano, bsquet y ftbol.  Saltar la soga.  Tomar clases de baile aerbico. CMO PUEDO SER MS ACTIVO EN MIS ACTIVIDADES DIARIAS?  Utilice las Clinical cytogeneticist del ascensor.  D una caminata durante su hora de almuerzo.  Si conduce, estacione el automvil ms lejos del trabajo o de la escuela.  Si [place] transporte  pblico, bjese una parada antes y camine el resto del camino.  Pngase de pie y camine cada vez que haga llamadas telefnicas.  Levntese, estrese y camine cada 47minutos a lo largo del Training and development officer. [place]?  No haga ejercicio en exceso que pudiera hacer que se lastime, se sienta mareado o tenga dificultad para respirar.  Consulte al mdico antes de comenzar un programa de ejercicios nuevo.  Use ropa cmoda y calzado con buen soporte.  Beba gran cantidad de agua mientras hace ejercicios para evitar la deshidratacin o los golpes de Freight forwarder. Durante la actividad fsica se pierde agua corporal que se debe reponer.  Haga ejercicio hasta que se acelere su respiracin y sus latidos cardacos.   Esta informacin no tiene Marine scientist el consejo del mdico. Asegrese de hacerle al mdico cualquier pregunta que tenga.   Document Released: 09/16/2010 Document Revised: 07/03/2014 Elsevier Interactive Patient Education Nation[employer] Insurance.

## 2015-05-06 LAB — CA 125: CA 125: 8.1 U/mL (ref 0.0–38.1)

## 2015-05-26 ENCOUNTER — Telehealth: Payer: Self-pay | Admitting: *Deleted

## 2015-05-26 NOTE — Telephone Encounter (Signed)
Second attempt today to reach patient through interpretator. Will continue to try.

## 2015-11-03 ENCOUNTER — Inpatient Hospital Stay: Payer: Self-pay | Attending: Obstetrics and Gynecology | Admitting: Obstetrics and Gynecology

## 2015-11-03 ENCOUNTER — Inpatient Hospital Stay: Payer: Self-pay | Admitting: *Deleted

## 2015-11-03 VITALS — BP 124/82 | HR 72 | Temp 98.7°F | Wt 207.9 lb

## 2015-11-03 DIAGNOSIS — C562 Malignant neoplasm of left ovary: Secondary | ICD-10-CM

## 2015-11-03 DIAGNOSIS — J45909 Unspecified asthma, uncomplicated: Secondary | ICD-10-CM | POA: Insufficient documentation

## 2015-11-03 DIAGNOSIS — D271 Benign neoplasm of left ovary: Secondary | ICD-10-CM | POA: Insufficient documentation

## 2015-11-03 DIAGNOSIS — D270 Benign neoplasm of right ovary: Secondary | ICD-10-CM | POA: Insufficient documentation

## 2015-11-03 DIAGNOSIS — Z90710 Acquired absence of both cervix and uterus: Secondary | ICD-10-CM | POA: Insufficient documentation

## 2015-11-03 DIAGNOSIS — Z8542 Personal history of malignant neoplasm of other parts of uterus: Secondary | ICD-10-CM | POA: Insufficient documentation

## 2015-11-03 DIAGNOSIS — E669 Obesity, unspecified: Secondary | ICD-10-CM | POA: Insufficient documentation

## 2015-11-03 DIAGNOSIS — C801 Malignant (primary) neoplasm, unspecified: Secondary | ICD-10-CM

## 2015-11-03 DIAGNOSIS — C563 Malignant neoplasm of bilateral ovaries: Secondary | ICD-10-CM

## 2015-11-03 DIAGNOSIS — C541 Malignant neoplasm of endometrium: Secondary | ICD-10-CM

## 2015-11-03 DIAGNOSIS — M1990 Unspecified osteoarthritis, unspecified site: Secondary | ICD-10-CM | POA: Insufficient documentation

## 2015-11-03 DIAGNOSIS — Z90722 Acquired absence of ovaries, bilateral: Secondary | ICD-10-CM | POA: Insufficient documentation

## 2015-11-03 DIAGNOSIS — C561 Malignant neoplasm of right ovary: Secondary | ICD-10-CM

## 2015-11-03 DIAGNOSIS — Z87891 Personal history of nicotine dependence: Secondary | ICD-10-CM | POA: Insufficient documentation

## 2015-11-03 NOTE — Progress Notes (Signed)
Gynecologic Oncology Interval Note  Chief Complaint: Surveillance visit for uterine cancer and bilateral ovarian borderline tumor excrescences.  Subjective:   She has no complaints today.  Normal GI/GU function. Accompanied by interpreter. Her CA125 is pending today.  05/05/15 CA125 = 8.1  Oncology history: Elizabeth Davenport is a 60 y.o. G2P2 Hispanic woman who developed light vaginal bleeding in early 2016 after undergoing LMP/menopause in 2014. Dr DeFrancesco did PAP that was normal.  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  CT scan 09/22/14 abd/pelvis negative for metastatic disease.  She was referred to [place] and on exam the cervix was normal.  Slides were reviewed at [facility] and read as probable endometrial primary.  She underwent TLH[facility]BSO Nov 04, 2014 at [facility].  Right ovarian excresence noted at surgery.  Survey of the rest of the abdomen, including the omentum and right diaphragm was negative.  Further staging not performed due to patient's refusal to accept blood products as she is a Jehovah's witness.  Path at [facility] showed 2.6 cm grade 1 endometrial cancer without LVSI or cervical involvement.  The ovaries both were found to have low grade serous cancers of 7 and 8 mm.  No spread noted, but washings were positive. [facility] review was obtained and confirmed  In view of the path report suggesting primary endometrial and uterine cancers I has the pathology reviewed at [facility].  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  However, the ovarian excresences that were read as low grade serous carcinoma were interpreted as benign serous borderline tumors by the [facility] Pathologists (several of them reviewed the case). The recommendation was for surveillance.   Problem List: Patient Active Problem List   Diagnosis Date Noted  . Ovarian cancer, bilateral ([place]) 11/25/2014  . Endometrial cancer ([place])  11/04/2014  . S/P laparoscopic hysterectomy 11/04/2014    Past Medical History: Past Medical History  Diagnosis Date  . Asthma   . Arthritis     left knee  . PONV (postoperative nausea and vomiting)   . Endometrial cancer ([place]) 11/04/14    Past Surgical History: Past Surgical History  Procedure Laterality Date  . Removed mass from thyriod    . Laparoscopic hysterectomy N/A 11/04/2014    Procedure: HYSTERECTOMY TOTAL LAPAROSCOPIC;  Surgeon: Mellody Drown, MD;  Location: [facility] ORS;  Service: Gynecology;  Laterality: N/A;  . Laparoscopic bilateral salpingo oopherectomy Bilateral 11/04/2014    Procedure: LAPAROSCOPIC BILATERAL SALPINGO OOPHORECTOMY;  Surgeon: Mellody Drown, MD;  Location: [facility] ORS;  Service: Gynecology;  Laterality: Bilateral;    Family History: Family History  Problem Relation Age of Onset  . Breast cancer Sister 33    Social History: Social History   Social History  . Marital Status: Married    Spouse Name: N/A  . Number of Children: N/A  . Years of Education: N/A   Occupational History  . Not on file.   Social History Main Topics  . Smoking status: Former Smoker -- 8 years    Quit date: 06/29/1990  . Smokeless tobacco: Never Used     Comment: Smoking History Cigarettes 1 cig per day; quit 22 years ago; smoked for 8 years  . Alcohol Use: 0.0 oz/week    0 Standard drinks or equivalent per week     Comment: once a year  . Drug Use: No  . Sexual Activity:    Partners: Male   Other Topics Concern  .  Not on file   Social History Narrative    Allergies: No Known Allergies  Current Medications: Current Outpatient Prescriptions  Medication Sig Dispense Refill  . albuterol (PROVENTIL HFA;VENTOLIN HFA) 108 (90 BASE) MCG/ACT inhaler Inhale 2 puffs into the lungs every 6 (six) hours as needed for wheezing or shortness of breath.     No current facility-administered medications for this visit.    Review of Systems General: no complaints   HEENT: no complaints  Lungs: no complaints  Cardiac: no complaints  GI: no complaints  GU: no complaints  Musculoskeletal: no complaints  Extremities: no complaints  Skin: no complaints  Neuro: no complaints  Endocrine: no complaints  Psych: no complaints      Objective:  Physical Examination:  Filed Vitals:   11/03/15 1423  BP: 124/82  Pulse: 72  Temp: 98.7 F (37.1 C)    Body mass index is 38.01 kg/(m^2).  ECOG Performance Status: 0 - Asymptomatic  General appearance: alert, cooperative and appears stated age HEENT:PERRLA, extra ocular movement intact Lymph node survey: negative Cardiovascular: normal rate and rhythm Respiratory: clear to auscultation Breast exam: deferred Abdomen: soft, non-tender, without masses or organomegaly, no hernias and well healed incisions Back: inspection of back is normal Extremities: extremities normal, atraumatic, no cyanosis or edema Skin exam: normal coloration and turgor, no rashes, no suspicious skin lesions noted. Neurological exam reveals: no focal findings or movement disorder noted. Abdomen: laparoscopy incisions well healed.  No masses or tenderness Pelvic: EGBUS/Vagina:Cuff well healed.Bimanual: no masses or tenderness. RV normal, no masses.   Assessment:  Elizabeth Davenport is a 60 y.o. 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 NED. Obesity.   Jehovah's witness.  Plan:   Problem List Items Addressed This Visit      Genitourinary   Endometrial cancer ([place]) - Primary   Ovarian cancer, bilateral ([place])      We discussed surveillance and survivorship issues.    I have recommended continued close follow up with exams, including pelvic exams every 3-6 months for 2-3 years, then every 6-12 months for 3-5 years and then annually thereafter. CA125 will be obtained for surveillance of borderline  tumor. Imaging and laboratory assessment is based on clinical indication. Patient education for obesity, lifestyle, exercise, nutrition. We discussed her weight and need for weight loss, stressed good nutrition, and exercise. She is exercising 4 days a week and I asked her to increase to 5. I provided information regarding calorie counting and exercise for weight loss. I offered referral to the CARE program and provided a pamphlet today.     Mellody Drown, MD  CC:  Dr. Rubie Maid

## 2015-11-04 LAB — CA 125: CA 125: 12.7 U/mL (ref 0.0–38.1)

## 2015-11-24 ENCOUNTER — Encounter: Payer: Self-pay | Admitting: Obstetrics and Gynecology

## 2015-11-24 NOTE — Progress Notes (Signed)
  Oncology Nurse Navigator Documentation  Navigator Location: [facility] (11/24/15 1000) Navigator Encounter Type: Letter/Fax/Email (11/24/15 1000)                                          Time Spent with Patient: 15 (11/24/15 1000)   Letter printed and sent

## 2016-05-03 ENCOUNTER — Inpatient Hospital Stay: Payer: Self-pay

## 2016-05-03 ENCOUNTER — Encounter (INDEPENDENT_AMBULATORY_CARE_PROVIDER_SITE_OTHER): Payer: Self-pay

## 2016-05-03 NOTE — Progress Notes (Deleted)
Gynecologic Oncology Interval Note  Chief Complaint: Surveillance visit for uterine cancer and bilateral ovarian borderline tumor excrescences.  Subjective:   She has no complaints today.  Normal GI/GU function. Accompanied by interpreter. Her CA125 is pending today.  05/05/15 CA125 = 8.1  Lab Results  Component Value Date   CA125 12.7 11/03/2015    Oncology history: Elizabeth Davenport is a 60 y.o. G2P2 Hispanic woman who developed light vaginal bleeding in early 2016 after undergoing LMP/menopause in 2014. Dr DeFrancesco did PAP that was normal.  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  CT scan 09/22/14 abd/pelvis negative for metastatic disease.  She was referred to [place] and on exam the cervix was normal.  Slides were reviewed at [facility] and read as probable endometrial primary.  She underwent TLH/BSO Nov 04, 2014 at [facility].  Right ovarian excresence noted at surgery.  Survey of the rest of the abdomen, including the omentum and right diaphragm was negative.  Further staging not performed due to patient's refusal to accept blood products as she is a Jehovah's witness.  Path at [facility] showed 2.6 cm grade 1 endometrial cancer without LVSI or cervical involvement.  The ovaries both were found to have low grade serous cancers of 7 and 8 mm.  No spread noted, but washings were positive. [facility] review was obtained and confirmed  In view of the path report suggesting primary endometrial and uterine cancers I has the pathology reviewed at [facility].  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  However, the ovarian excresences that were read as low grade serous carcinoma were interpreted as benign serous borderline tumors by the [facility] Pathologists (several of them reviewed the case). The recommendation was for surveillance.   Problem List: Patient Active Problem List   Diagnosis Date Noted  . Ovarian  cancer, bilateral ([place]) 11/25/2014  . Endometrial cancer ([place]) 11/04/2014  . S/P laparoscopic hysterectomy 11/04/2014    Past Medical History: Past Medical History:  Diagnosis Date  . Arthritis    left knee  . Asthma   . Endometrial cancer ([place]) 11/04/14  . PONV (postoperative nausea and vomiting)     Past Surgical History: Past Surgical History:  Procedure Laterality Date  . LAPAROSCOPIC BILATERAL SALPINGO OOPHERECTOMY Bilateral 11/04/2014   Procedure: LAPAROSCOPIC BILATERAL SALPINGO OOPHORECTOMY;  Surgeon: Mellody Drown, MD;  Location: [facility] ORS;  Service: Gynecology;  Laterality: Bilateral;  . LAPAROSCOPIC HYSTERECTOMY N/A 11/04/2014   Procedure: HYSTERECTOMY TOTAL LAPAROSCOPIC;  Surgeon: Mellody Drown, MD;  Location: [facility] ORS;  Service: Gynecology;  Laterality: N/A;  . removed mass from thyriod      Family History: Family History  Problem Relation Age of Onset  . Breast cancer Sister 64    Social History: Social History   Social History  . Marital status: Married    Spouse name: N/A  . Number of children: N/A  . Years of education: N/A   Occupational History  . Not on file.   Social History Main Topics  . Smoking status: Former Smoker    Years: 8.00    Quit date: 06/29/1990  . Smokeless tobacco: Never Used     Comment: Smoking History Cigarettes 1 cig per day; quit 22 years ago; smoked for 8 years  . Alcohol use 0.0 oz/week     Comment: once a year  . Drug use: No  . Sexual activity: Yes    Partners: Male  Other Topics Concern  . Not on file   Social History Narrative  . No narrative on file    Allergies: No Known Allergies  Current Medications: Current Outpatient Prescriptions  Medication Sig Dispense Refill  . albuterol (PROVENTIL HFA;VENTOLIN HFA) 108 (90 BASE) MCG/ACT inhaler Inhale 2 puffs into the lungs every 6 (six) hours as needed for wheezing or shortness of breath.     No current facility-administered medications for this visit.      Review of Systems General: no complaints  HEENT: no complaints  Lungs: no complaints  Cardiac: no complaints  GI: no complaints  GU: no complaints  Musculoskeletal: no complaints  Extremities: no complaints  Skin: no complaints  Neuro: no complaints  Endocrine: no complaints  Psych: no complaints      Objective:  Physical Examination:  There were no vitals filed for this visit.  There is no height or weight on file to calculate BMI.  ECOG Performance Status: 0 - Asymptomatic  General appearance: alert, cooperative and appears stated age HEENT:PERRLA, extra ocular movement intact Lymph node survey: negative Cardiovascular: normal rate and rhythm Respiratory: clear to auscultation Breast exam: deferred Abdomen: soft, non-tender, without masses or organomegaly, no hernias and well healed incisions Back: inspection of back is normal Extremities: extremities normal, atraumatic, no cyanosis or edema Skin exam: normal coloration and turgor, no rashes, no suspicious skin lesions noted. Neurological exam reveals: no focal findings or movement disorder noted. Abdomen: laparoscopy incisions well healed.  No masses or tenderness Pelvic: EGBUS/Vagina:Cuff well healed.Bimanual: no masses or tenderness. RV normal, no masses.   Assessment:  Elizabeth Davenport is a 60 y.o. 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 NED. Obesity.   Jehovah's witness.  Plan:   Problem List Items Addressed This Visit    None      We discussed surveillance and survivorship issues.    I have recommended continued close follow up with exams, including pelvic exams every 3-6 months for 2-3 years, then every 6-12 months for 3-5 years and then annually thereafter. CA125 will be obtained for surveillance of borderline tumor. Imaging and laboratory assessment is based on clinical  indication. Patient education for obesity, lifestyle, exercise, nutrition. We discussed her weight and need for weight loss, stressed good nutrition, and exercise. She is exercising 4 days a week and I asked her to increase to 5. I provided information regarding calorie counting and exercise for weight loss. I offered referral to the CARE program and provided a pamphlet today.     Gillis Ends, MD  CC:  Dr. Rubie Maid

## 2016-05-10 ENCOUNTER — Inpatient Hospital Stay: Payer: Self-pay | Attending: Obstetrics and Gynecology | Admitting: Obstetrics and Gynecology

## 2016-05-10 ENCOUNTER — Inpatient Hospital Stay: Payer: Self-pay

## 2016-05-10 ENCOUNTER — Encounter: Payer: Self-pay | Admitting: Obstetrics and Gynecology

## 2016-05-10 VITALS — BP 131/84 | HR 77 | Temp 98.0°F | Resp 16 | Ht 60.0 in | Wt 210.2 lb

## 2016-05-10 DIAGNOSIS — C563 Malignant neoplasm of bilateral ovaries: Secondary | ICD-10-CM

## 2016-05-10 DIAGNOSIS — M1990 Unspecified osteoarthritis, unspecified site: Secondary | ICD-10-CM | POA: Insufficient documentation

## 2016-05-10 DIAGNOSIS — E6609 Other obesity due to excess calories: Secondary | ICD-10-CM | POA: Insufficient documentation

## 2016-05-10 DIAGNOSIS — C541 Malignant neoplasm of endometrium: Secondary | ICD-10-CM

## 2016-05-10 DIAGNOSIS — C562 Malignant neoplasm of left ovary: Principal | ICD-10-CM

## 2016-05-10 DIAGNOSIS — C569 Malignant neoplasm of unspecified ovary: Secondary | ICD-10-CM | POA: Insufficient documentation

## 2016-05-10 DIAGNOSIS — Z6841 Body Mass Index (BMI) 40.0 and over, adult: Secondary | ICD-10-CM | POA: Insufficient documentation

## 2016-05-10 DIAGNOSIS — IMO0001 Reserved for inherently not codable concepts without codable children: Secondary | ICD-10-CM

## 2016-05-10 DIAGNOSIS — Z87891 Personal history of nicotine dependence: Secondary | ICD-10-CM | POA: Insufficient documentation

## 2016-05-10 DIAGNOSIS — Z90722 Acquired absence of ovaries, bilateral: Secondary | ICD-10-CM | POA: Insufficient documentation

## 2016-05-10 DIAGNOSIS — J45909 Unspecified asthma, uncomplicated: Secondary | ICD-10-CM | POA: Insufficient documentation

## 2016-05-10 DIAGNOSIS — Z8542 Personal history of malignant neoplasm of other parts of uterus: Secondary | ICD-10-CM | POA: Insufficient documentation

## 2016-05-10 DIAGNOSIS — Z90710 Acquired absence of both cervix and uterus: Secondary | ICD-10-CM | POA: Insufficient documentation

## 2016-05-10 DIAGNOSIS — C561 Malignant neoplasm of right ovary: Secondary | ICD-10-CM

## 2016-05-10 DIAGNOSIS — D3912 Neoplasm of uncertain behavior of left ovary: Secondary | ICD-10-CM | POA: Insufficient documentation

## 2016-05-10 DIAGNOSIS — D3911 Neoplasm of uncertain behavior of right ovary: Secondary | ICD-10-CM | POA: Insufficient documentation

## 2016-05-10 DIAGNOSIS — D3910 Neoplasm of uncertain behavior of unspecified ovary: Secondary | ICD-10-CM

## 2016-05-10 NOTE — Progress Notes (Signed)
  Oncology Nurse Navigator Documentation Chaperoned pelvic exam. Will have CA-125 drawn today. Return in 6 months. Navigator Location: [facility] (05/10/16 1300)   )Navigator Encounter Type: Follow-up Appt;Clinic/MDC (05/10/16 1300)                                                    Time Spent with Patient: 15 (05/10/16 1300)

## 2016-05-10 NOTE — Progress Notes (Signed)
Gynecologic Oncology Interval Note  Chief Complaint: Surveillance for uterine cancer and bilateral ovarian borderline tumor excrescences.  Subjective:   She has no complaints today.  Normal GI/GU function. Accompanied by interpreter. Her CA125 is pending. She had a mammogram a year ago and will be scheduling another exam with her primary care providers at [facility] clinic. I asked her to also get her breast clinical exams there as well.   Lab Results  Component Value Date   CA125 12.7 11/03/2015     Oncology history: Elizabeth Davenport is a 60 y.o. G2P2 Hispanic woman who developed light vaginal bleeding in early 2016 after undergoing LMP/menopause in 2014. Dr DeFrancesco did PAP that was normal.  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  CT scan 09/22/14 abd/pelvis negative for metastatic disease.  She was referred to [place] and on exam the cervix was normal.  Slides were reviewed at [facility] and read as probable endometrial primary.  She underwent TLH/BSO Nov 04, 2014 at [facility].  Right ovarian excresence noted at surgery.  Survey of the rest of the abdomen, including the omentum and right diaphragm was negative.  Further staging not performed due to patient's refusal to accept blood products as she is a Jehovah's witness.  Path at [facility] showed 2.6 cm grade 1 endometrial cancer without LVSI or cervical involvement.  The ovaries both were found to have low grade serous cancers of 7 and 8 mm.  No spread noted, but washings were positive. [facility] review was obtained and confirmed   In view of the path report suggesting primary endometrial and uterine cancers I has the pathology reviewed at [facility].  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  However, the ovarian excresences that were read as low grade serous carcinoma were interpreted as benign serous borderline tumors by the [facility] Pathologists  (several of them reviewed the case). The recommendation was for surveillance.   Oncology History:  Problem List: Patient Active Problem List   Diagnosis Date Noted  . 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 Ovarian cancer, bilateral ([place]) 11/25/2014  . Endometrial cancer ([place]) 11/04/2014  . S/P laparoscopic hysterectomy 11/04/2014    Past Medical History: Past Medical History:  Diagnosis Date  . Arthritis    left knee  . Asthma   . Endometrial cancer ([place]) 11/04/14  . PONV (postoperative nausea and vomiting)     Past Surgical History: Past Surgical History:  Procedure Laterality Date  . LAPAROSCOPIC BILATERAL SALPINGO OOPHERECTOMY Bilateral 11/04/2014   Procedure: LAPAROSCOPIC BILATERAL SALPINGO OOPHORECTOMY;  Surgeon: Mellody Drown, MD;  Location: [facility] ORS;  Service: Gynecology;  Laterality: Bilateral;  . LAPAROSCOPIC HYSTERECTOMY N/A 11/04/2014   Procedure: HYSTERECTOMY TOTAL LAPAROSCOPIC;  Surgeon: Mellody Drown, MD;  Location: [facility] ORS;  Service: Gynecology;  Laterality: N/A;  . removed mass from thyriod      Family History: Family History  Problem Relation Age of Onset  . Breast cancer Sister 101    Social History: Social History   Social History  . Marital status: Married    Spouse name: N/A  . Number of children: N/A  . Years of education: N/A   Occupational History  . Not on file.   Social History Main Topics  . Smoking status: Former Smoker    Years: 8.00    Quit date: 06/29/1990  . Smokeless tobacco: Never Used     Comment: Smoking History  Cigarettes 1 cig per day; quit 22 years ago; smoked for 8 years  . Alcohol use 0.0 oz/week     Comment: once a year  . Drug use: No  . Sexual activity: Yes    Partners: Male   Other Topics Concern  . Not on file   Social History Narrative  . No narrative on file    Allergies: No Known Allergies  Current Medications: Current Outpatient Prescriptions  Medication Sig Dispense  Refill  . albuterol (PROVENTIL HFA;VENTOLIN HFA) 108 (90 BASE) MCG/ACT inhaler Inhale 2 puffs into the lungs every 6 (six) hours as needed for wheezing or shortness of breath.     No current facility-administered medications for this visit.     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   Extremities: no complaints  Skin: no complaints  Neuro: no complaints  Endocrine: no complaints  Psych: no complaints      Objective:  Physical Examination:    BP 131/84 (BP Location: Left Arm, Patient Position: Sitting)   Pulse 77   Temp 98 F (36.7 C) (Tympanic)   Resp 16   Ht 5' (1.524 m)   Wt 210 lb 3.3 oz (95.4 kg)   LMP 11/04/2011   BMI 41.05 kg/m    ECOG Performance Status: 0 - Asymptomatic  General appearance: alert, cooperative and appears stated age HEENT:PERRLA, extra ocular movement intact Lymph node survey: negative Cardiovascular: normal rate and rhythm Respiratory: clear to auscultation Breast exam: deferred Abdomen: soft, non-tender, without masses or organomegaly, no hernias and well healed incisions Back: inspection of back is normal Extremities: extremities normal, atraumatic, no cyanosis or edema Skin exam: normal coloration and turgor, no rashes, no suspicious skin lesions noted. Neurological exam reveals: no focal findings or movement disorder noted. Abdomen: laparoscopy incisions well healed.   Pelvic: Chaperoned by RN. EGBUS/Vagina:Cuff well healed.Bimanual: no masses or tenderness. RV deferred.   Assessment:  Elizabeth Davenport is a 60 y.o. 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 NED. Obesity.   Jehovah's  witness.  Plan:   Problem List Items Addressed This Visit      Genitourinary   Endometrial cancer ([place]) - Primary   Neoplasm of ovary with borderline malignant features    Other Visit Diagnoses    Class 3 obesity due to excess calories without serious comorbidity in adult, unspecified BMI          We discussed surveillance and survivorship issues. Plan for CA125 today.   Her weight has actually decreased 4 pounds since her last visit; her BMI increased b/c they had her listed as 5'2" last year and 5' today.  Patient education for obesity, lifestyle, exercise, nutrition was reviewed. We discussed her weight and need for weight loss, stressed good nutrition, and exercise. She is exercising 3-4 days a week, 25 minutes of walking, and I asked her to increase if possible. I provided information regarding calorie counting. I had previously offered referral to the CARE program and provided a pamphlet.    I have recommended continued close follow up with exams, including pelvic exams every 3-6 months for 2-3 years, then every 6-12 months for 3-5 years and then annually thereafter. CA125 will  be obtained for surveillance of borderline tumor. Imaging and laboratory assessment is based on clinical indication.     Gillis Ends, MD  CC:  Dr. Rubie Maid

## 2016-05-10 NOTE — Patient Instructions (Signed)
Recuento de caloras para bajar de peso (Calorie Counting for [employer] Life Loss) Las caloras son energa que se obtiene de lo que se come y se bebe. El organismo [place] esta energa para mantenerlo Curator. La cantidad de caloras que come tiene incidencia Lubrizol Corporation. Cuando come ms caloras de las que el cuerpo necesita, este acumula las caloras extra [place]. Cuando come Universal Health de las que el cuerpo [place], este quema grasa para obtener la energa que requiere. El recuento de caloras es el registro de la cantidad de caloras que come y Pharmacologist. Si se asegura de comer menos caloras de las que el cuerpo necesita, debe bajar de [place]. Para que el recuento de caloras funcione, tendr que comer la cantidad de caloras adecuadas para usted en un da, para bajar una cantidad de peso saludable por semana. Una cantidad de peso saludable para bajar por semana suele ser [place] 1 y Ivar Drape (0,5 a 0,9kg). Un nutricionista puede determinar la cantidad de caloras que necesita por da y sugerirle cmo alcanzar su objetivo calrico.  QU DEBO SABER ACERCA DEL [place]? A fin de alcanzar su objetivo diario de caloras, tendr que:  Averiguar cuntas caloras hay en cada alimento que le Therapist, occupational. Intente hacerlo antes de comer.  Decida la cantidad que puede comer del alimento.  Anote lo que comi y cuntas caloras tena. Esta tarea se conoce como llevar un registro de comidas. DNDE ENCUENTRO INFORMACIN SOBRE LAS CALORAS? Es posible Animator cantidad de caloras que contiene un alimento en la etiqueta de informacin nutricional. Tenga en cuenta que toda la informacin que se incluye en una etiqueta se basa en una porcin especfica del alimento. Si un alimento no tiene una etiqueta de informacin nutricional, intente buscar las caloras en Internet o pida ayuda al nutricionista. CMO DECIDO CUNTO COMER? Para decidir qu cantidad del alimento puede comer,  tendr que tener en cuenta el nmero de caloras en una porcin y el tamao de una porcin. Es posible Financial trader en la etiqueta de informacin nutricional. Si un alimento no tiene una etiqueta de informacin nutricional, busque los [place] en Internet o pida ayuda al nutricionista. Recuerde que las caloras se calculan por porcin. Si opta por comer ms de una porcin de un alimento, tendr Tenneco Inc las caloras por porcin por la cantidad de porciones que planea comer. Por ejemplo, la etiqueta de un envase de pan puede decir que el tamao de una porcin es [place], y que una porcin tiene 90caloras. Si come 1rodaja, habr comido 90caloras. Si come 2rodajas, habr comido 180caloras. [place]? Despus de cada comida, registre la siguiente informacin en el registro de comidas:  Lo que comi.  La cantidad que comi.  La cantidad de caloras que tena.  Luego, sume las caloras. Tenga a Materials engineer de comidas, por ejemplo, en un anotador de bolsillo. Otra alternativa es usar una aplicacin en el telfono mvil o un sitio web. Algunos programas calcularn las caloras y Family Dollar Stores la cantidad de caloras que le [place], [place] vez que agregue un alimento al Control and instrumentation engineer. CULES SON ALGUNOS CONSEJOS PARA EL [place]?  Use las caloras de los alimentos y las bebidas que lo sacien y no lo dejen con apetito, por ejemplo, frutos secos y Engineer, mining de frutos secos, verduras, protenas magras y alimentos con alto contenido de fibra (ms de 5g de fibra por porcin).  Coma alimentos nutritivos y evite  las caloras vacas. Las caloras vacas son aquellas que se obtienen de los alimentos o las bebidas que no contienen muchos nutrientes, como los dulces y los refrescos. Es mejor comer una comida nutritiva altamente calrica (como un aguacate) que una con pocos nutrientes (como una bolsa de patatas fritas).  Sepa cuntas caloras tienen los  alimentos que come con ms frecuencia. eBay, no tiene que buscar este dato cada vez que los come.  Est atento a los Chiropodist hipocalricos, pero que en realidad contienen muchas caloras, como los productos de [place], los refrescos y los dulces sin Lobbyist.  Preste atencin a las Automatic Data, Franklin Resources refrescos, las bebidas a base de [place], las bebidas con alcohol y los jugos, que contienen muchas caloras, pero no le dan saciedad. Opte por las bebidas bajas en caloras, como el agua y las bebidas dietticas.  Concntrese en tratar de contar las caloras de los alimentos que tienen la mayor cantidad de caloras. Registrar las caloras de una ensalada que solo contiene hortalizas es menos importante que calcular las de un batido de [place].  Encuentre un mtodo para controlar las caloras que funcione para usted. Sea creativo. La State Farm de las personas que alcanzan el xito encuentran mtodos para llevar un registro de cunto comen en un da, incluso si no cuentan cada calora. CULES SON ALGUNOS CONSEJOS PARA CONTROLAR LAS PORCIONES?  Sepa cuntas caloras hay en una porcin. Esto lo ayudar a saber cuntas porciones de un alimento determinado puede comer.  Use una taza medidora para medir los Northrop Grumman, lo que es muy til al principio. Con el tiempo, podr hacer un clculo estimativo de los tamaos de las porciones de algunos alimentos.  Dedique tiempo a poner porciones de diferentes alimentos en sus platos, tazones y tazas predilectos, a fin de saber cmo se ve una porcin.  Intente no comer directamente de [place] bolsa o una caja, ya que esto puede llevarlo a comer en exceso. Ponga la cantidad Land O'Lakes gustara comer en una taza o un plato, a fin de asegurarse de que est comiendo la porcin correcta.  Use platos, vasos y tazones ms pequeos para no comer en exceso. Esta es una forma rpida y sencilla de poner en prctica el control de las  porciones. Si el plato es ms pequeo, le caben menos alimentos.  Intente no hacer muchas tareas mientras come, como ver televisin o usar la computadora. Si es la hora de comer, sintese a Conservation officer, nature y disfrute de Environmental education officer. Esto lo ayudar a que empiece a Marine scientist cundo est satisfecho. Tambin le permitir estar ms consciente de lo que come y cunto est comiendo. [place]?  Pida porciones ms pequeas o porciones para nios.  Considere la posibilidad de Publishing rights manager un plato principal y las guarniciones, en lugar de pedir su propio plato principal.  Si pide su propio plato principal, coma solo la mitad. Pida una caja al comienzo de la comida y ponga all el resto del plato principal, para no sentir la tentacin de comerlo.  Busque las caloras en el men. Si se detallan las caloras, elija las opciones que contengan la menor cantidad.  Elija platos que incluyan verduras, frutas, cereales integrales, productos lcteos con bajo contenido de grasa y Advertising account planner. Centrarse en elegir con inteligencia alimentos de cada uno de los 5grupos que puede ayudarlo a seguir por el buen camino en los restaurantes.  Opte por los alimentos hervidos, asados, cocidos a la parrilla o al vapor.  Elija el agua, la [place], [place] t helado sin azcar u otras bebidas que no contengan azcares agregados. Si desea una bebida alcohlica, escoja una opcin con menos caloras. Por ejemplo, un margarita normal puede The Sherwin-Williams, y un vaso de vino tiene unas 150.  No coma alimentos que contengan mantequilla, estn empanados, fritos o que se sirvan con salsa a base de crema. Generalmente, los alimentos que se etiquetan como "crujientes" estn fritos, a menos que se indique lo contrario.  Ordene los Kimberly-Clark, las salsas y los jarabes aparte, ya que suelen tener muchas caloras; por lo tanto, no los consuma en grandes cantidades.  Tenga cuidado con las  [place]. Muchas personas piensan que las ensaladas son Ardelia Mems opcin saludable, pero en muchas cosas, esto no es as. Hay muchas ensaladas que contienen tocino, pollo frito, grandes cantidades de [place], patatas fritas y Lexicographer. Todos estos productos son altamente calricos. Si desea Katherine Mantle, elija una de hortalizas y pida carnes a la parrilla o un filete. Ordene el aderezo aparte o pida aceite de [place] y vinagre o limn para Haematologist.  Haga un clculo estimativo de la cantidad de porciones que le sirven. Por ejemplo, una porcin de arroz cocido equivale a media taza o tiene el tamao aproximado de un molde de [place], o de media pelota de tenis. Conocer el tamao de las porciones lo ayudar a Personnel officer atento a la cantidad de comida que come Occidental Petroleum. La lista que sigue le [place] el tamao de algunas porciones comunes a partir de objetos cotidianos.  1onza (28g) = 4dados apilados.  3onzas (85g) = 75mazo de cartas.  1cucharadita = 1dado.  1cucharada = media pelota de tenis de mesa.  2cucharadas = 1pelota de tenis de mesa.  Media taza = 1pelota de tenis o 107molde de magdalena.  1taza = 1 pelota de bisbol. Esta informacin no tiene Marine scientist el consejo del mdico. Asegrese de hacerle al mdico cualquier pregunta que tenga. Document Released: 09/28/2008 Document Revised: 07/03/2014 Document Reviewed: 04/17/2013 Elsevier Interactive Patient Education  2017 Reynolds [employer].

## 2016-05-10 NOTE — Progress Notes (Signed)
Patient here for follow up. She states she feels bloated on her left side if she "hold" her urine too long.

## 2016-05-11 LAB — CA 125: CA 125: 16.2 U/mL (ref 0.0–38.1)

## 2016-05-16 ENCOUNTER — Telehealth: Payer: Self-pay

## 2016-05-16 NOTE — Telephone Encounter (Signed)
  Oncology Nurse Navigator Documentation Voicemail left by interpreter Elizabeth Davenport for Elizabeth Davenport to return call. Would like to notify of Ca 125 result of 16.2 Navigator Location: [facility] (05/16/16 1100)   )Navigator Encounter Type: Treatment;Diagnostic Results (05/16/16 1100)                                                    Time Spent with Patient: 15 (05/16/16 1100)

## 2016-05-16 NOTE — Telephone Encounter (Signed)
  Oncology Nurse Navigator Documentation Daughter returned call for lab results. Notified of CA 125, 16.2 Navigator Location: [facility] (05/16/16 1500)   )Navigator Encounter Type: Telephone;Diagnostic Results (05/16/16 1500)                                                    Time Spent with Patient: 15 (05/16/16 1500)

## 2016-11-08 ENCOUNTER — Inpatient Hospital Stay (HOSPITAL_BASED_OUTPATIENT_CLINIC_OR_DEPARTMENT_OTHER): Payer: Self-pay | Admitting: Obstetrics and Gynecology

## 2016-11-08 ENCOUNTER — Ambulatory Visit
Admission: RE | Admit: 2016-11-08 | Discharge: 2016-11-08 | Disposition: A | Discharge status: Home / Self Care | Payer: Self-pay | Source: Ambulatory Visit | Attending: Oncology | Admitting: Oncology

## 2016-11-08 ENCOUNTER — Encounter (INDEPENDENT_AMBULATORY_CARE_PROVIDER_SITE_OTHER): Payer: Self-pay

## 2016-11-08 ENCOUNTER — Ambulatory Visit: Payer: Self-pay | Attending: Oncology

## 2016-11-08 ENCOUNTER — Inpatient Hospital Stay: Payer: Self-pay | Attending: Obstetrics and Gynecology | Admitting: *Deleted

## 2016-11-08 VITALS — BP 129/80 | HR 79 | Temp 97.2°F | Resp 18 | Ht 60.0 in | Wt 209.0 lb

## 2016-11-08 VITALS — BP 136/82 | HR 61 | Temp 97.6°F | Resp 18 | Ht 60.0 in | Wt 209.4 lb

## 2016-11-08 DIAGNOSIS — Z87891 Personal history of nicotine dependence: Secondary | ICD-10-CM | POA: Insufficient documentation

## 2016-11-08 DIAGNOSIS — C541 Malignant neoplasm of endometrium: Secondary | ICD-10-CM

## 2016-11-08 DIAGNOSIS — C562 Malignant neoplasm of left ovary: Secondary | ICD-10-CM

## 2016-11-08 DIAGNOSIS — Z6841 Body Mass Index (BMI) 40.0 and over, adult: Secondary | ICD-10-CM | POA: Insufficient documentation

## 2016-11-08 DIAGNOSIS — Z0000 Encounter for general adult medical examination without abnormal findings: Secondary | ICD-10-CM

## 2016-11-08 DIAGNOSIS — E669 Obesity, unspecified: Secondary | ICD-10-CM | POA: Insufficient documentation

## 2016-11-08 DIAGNOSIS — C569 Malignant neoplasm of unspecified ovary: Secondary | ICD-10-CM | POA: Insufficient documentation

## 2016-11-08 DIAGNOSIS — Z8542 Personal history of malignant neoplasm of other parts of uterus: Secondary | ICD-10-CM | POA: Insufficient documentation

## 2016-11-08 DIAGNOSIS — Z90710 Acquired absence of both cervix and uterus: Secondary | ICD-10-CM

## 2016-11-08 DIAGNOSIS — Z90722 Acquired absence of ovaries, bilateral: Secondary | ICD-10-CM

## 2016-11-08 DIAGNOSIS — C561 Malignant neoplasm of right ovary: Secondary | ICD-10-CM

## 2016-11-08 DIAGNOSIS — C563 Malignant neoplasm of bilateral ovaries: Secondary | ICD-10-CM

## 2016-11-08 NOTE — Progress Notes (Addendum)
Subjective:     Patient ID: Elizabeth Davenport, female   DOB: [birth year], 61 y.o.   MRN: [id number]  HPI   Review of Systems     Objective:   Physical Exam  Pulmonary/Chest: Right breast exhibits no inverted nipple, no mass, no nipple discharge, no skin change and no tenderness. Left breast exhibits no inverted nipple, no mass, no nipple discharge, no skin change and no tenderness. Breasts are symmetrical.       Assessment:     61 year old hispanic patient presents for BCCCP clinic visit.  Patient screened, and meets BCCCP eligibility.  Patient does not have insurance, Medicare or Medicaid.  Handout given on Affordable Care Act.  Instructed patient on breast self-exam using teach back method.  CBE unremarkable. No mass or lump palpated.  Jaqui Laukaitis interpreted exam.  Patient is returning after her mammogram today to follow-up with Dr. Theora Gianotti for history of endometrial cancer.    Plan:     Sent for bilateral screening mammogram.

## 2016-11-08 NOTE — Progress Notes (Signed)
  Oncology Nurse Navigator Documentation Chaperoned pelvic exam. Follow up in 6 months with interpreter. Navigator Location: [facility] (11/08/16 1500)   )Navigator Encounter Type: Follow-up Appt (11/08/16 1500)                     Patient Visit Type: GynOnc (11/08/16 1500)                              Time Spent with Patient: 15 (11/08/16 1500)

## 2016-11-08 NOTE — Progress Notes (Signed)
Pt has no gyn concerns 

## 2016-11-08 NOTE — Progress Notes (Signed)
Gynecologic Oncology Interval Note  Chief Complaint: Surveillance for uterine cancer and bilateral ovarian borderline tumor excrescences.  Subjective:   Elizabeth Davenport is a pleasant patient who presents for surveillance for uterine cancer and bilateral ovarian borderline tumor excrescences.  She has no complaints today.  Normal GI/GU function. Accompanied by interpreter. Her CA125 is pending. She had a mammogram and breast exam today with BCCCP.   Last CA125 was normal.   Lab Results  Component Value Date   CA125 16.2 05/10/2016     Oncology history: Elizabeth Davenport is a 61 y.o. G2P2 woman who developed light vaginal bleeding in early 2016 after undergoing LMP/menopause in 2014. Dr DeFrancesco did PAP that was normal.  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  CT scan 09/22/14 abd/pelvis negative for metastatic disease.  She was referred to [place] and on exam the cervix was normal.  Slides were reviewed at [facility] and read as probable endometrial primary.  She underwent TLH/BSO Nov 04, 2014 at [facility].  Right ovarian excresence noted at surgery.  Survey of the rest of the abdomen, including the omentum and right diaphragm was negative.  Further staging not performed due to patient's refusal to accept blood products as she is a Jehovah's witness.  Path at [facility] showed 2.6 cm grade 1 endometrial cancer without LVSI or cervical involvement.  The ovaries both were found to have low grade serous cancers of 7 and 8 mm.  No spread noted, but washings were positive.   In view of the path report suggesting primary endometrial and uterine cancers I has the pathology reviewed at [facility].  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  However, the ovarian excresences that were read as low grade serous carcinoma were interpreted as benign serous borderline tumors by the [facility] Pathologists (several of them reviewed  the case). The recommendation was for surveillance.   Oncology History:  Problem List: Patient Active Problem List   Diagnosis Date Noted  . History of endometrial cancer 11/08/2016  . Serous cystadenoma with borderline malignant features ([place]) 11/08/2016  . 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 Ovarian cancer, bilateral ([place]) 11/25/2014  . Endometrial cancer ([place]) 11/04/2014  . S/P laparoscopic hysterectomy 11/04/2014    Past Medical History: Past Medical History:  Diagnosis Date  . Arthritis    left knee  . Asthma   . Endometrial cancer ([place]) 11/04/14  . PONV (postoperative nausea and vomiting)     Past Surgical History: Past Surgical History:  Procedure Laterality Date  . LAPAROSCOPIC BILATERAL SALPINGO OOPHERECTOMY Bilateral 11/04/2014   Procedure: LAPAROSCOPIC BILATERAL SALPINGO OOPHORECTOMY;  Surgeon: Mellody Drown, MD;  Location: [facility] ORS;  Service: Gynecology;  Laterality: Bilateral;  . LAPAROSCOPIC HYSTERECTOMY N/A 11/04/2014   Procedure: HYSTERECTOMY TOTAL LAPAROSCOPIC;  Surgeon: Mellody Drown, MD;  Location: [facility] ORS;  Service: Gynecology;  Laterality: N/A;  . removed mass from thyriod      Family History: Family History  Problem Relation Age of Onset  . Breast cancer Sister 69    Social History: Social History   Social History  . Marital status: Married    Spouse name: N/A  . Number of children: N/A  . Years of education: N/A   Occupational History  . Not on file.   Social History Main Topics  . Smoking status: Former Smoker    Years: 8.00    Quit date: 06/29/1990  .  Smokeless tobacco: Never Used     Comment: Smoking History Cigarettes 1 cig per day; quit 22 years ago; smoked for 8 years  . Alcohol use No  . Drug use: No  . Sexual activity: Yes    Partners: Male   Other Topics Concern  . Not on file   Social History Narrative  . No narrative on file    Allergies: No Known Allergies  Current  Medications: Current Outpatient Prescriptions  Medication Sig Dispense Refill  . albuterol (PROVENTIL HFA;VENTOLIN HFA) 108 (90 BASE) MCG/ACT inhaler Inhale 2 puffs into the lungs every 6 (six) hours as needed for wheezing or shortness of breath.     No current facility-administered medications for this visit.     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   Extremities: no complaints  Skin: no complaints  Neuro: no complaints  Endocrine: no complaints  Psych: no complaints      Objective:  Physical Examination:    BP 136/82   Pulse 61   Temp 97.6 F (36.4 C) (Tympanic)   Resp 18   Ht 5' (1.524 m)   Wt 209 lb 6.4 oz (95 kg)   LMP 11/04/2011   BMI 40.90 kg/m    ECOG Performance Status: 0 - Asymptomatic  General appearance: alert, cooperative and appears stated age HEENT:PERRLA, extra ocular movement intact Lymph node survey: negative Cardiovascular: normal rate and rhythm Respiratory: clear to auscultation Breast exam: deferred Abdomen: soft, non-tender, without masses or organomegaly, no hernias and well healed incisions Back: inspection of back is normal Extremities: extremities normal, atraumatic, no cyanosis or edema Skin exam: normal coloration and turgor, no rashes, no suspicious skin lesions noted. Neurological exam reveals: no focal findings or movement disorder noted. Abdomen: laparoscopy incisions well healed.   Pelvic: Chaperoned by RN. EGBUS/Vagina:Cuff well healed.Bimanual: no masses or tenderness. RV deferred.   Assessment:  Elizabeth Davenport is a 61 y.o. 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 NED. Obesity.    Jehovah's witness.  Plan:   Problem List Items Addressed This Visit      Other   History of endometrial cancer - Primary   Serous cystadenoma with borderline malignant features ([place])      We discussed surveillance and survivorship issues. Plan for CA125 today.   Her weight is stable.  We previously provided education for obesity, lifestyle, exercise, nutrition. I had previously offered referral to the CARE program and provided a pamphlet.    I have recommended continued close follow up with exams, including pelvic exams every 3-6 months for 2-3 years, then every 6-12 months for 3-5 years and then annually thereafter. CA125 will be obtained for surveillance of borderline tumor. Imaging and laboratory assessment is based on clinical indication.     Gillis Ends, MD  CC:  Dr. Rubie Maid

## 2016-11-09 LAB — CA 125: CA 125: 15.4 U/mL (ref 0.0–38.1)

## 2016-11-10 ENCOUNTER — Telehealth: Payer: Self-pay

## 2016-11-10 NOTE — Telephone Encounter (Signed)
  Oncology Nurse Navigator Documentation Daughter returned call for Elizabeth Davenport. Notified of CA 125 and mammogram results.  MS DIGITAL SCREENING TOMO BILATERAL (Accession [id number]) (Order [id number])  Imaging  Date: 11/08/2016 Department: [facility] Mammography Released By: Anastasia Pall Authorizing: Lloyd Huger, MD  Exam Information  Status Exam Begun   Exam Ended    Final [99] 11/08/2016  2:26 PM 11/08/2016  2:34 PM  Study Result  CLINICAL DATA:  Screening.   EXAM: 2D DIGITAL SCREENING BILATERAL MAMMOGRAM WITH CAD AND ADJUNCT TOMO   COMPARISON:  Previous exam(s).   ACR Breast Density Category c: The breast tissue is heterogeneously dense, which may obscure small masses.   FINDINGS: There are no findings suspicious for malignancy. Images were processed with CAD.   IMPRESSION: No mammographic evidence of malignancy. A result letter of this screening mammogram will be mailed directly to the patient.   RECOMMENDATION: Screening mammogram in one year. (Code:SM-B-01Y)   BI-RADS CATEGORY  1: Negative.     Electronically Signed   By: Ammie Ferrier M.D.   On: 11/09/2016 10:37    Result History  MS DIGITAL SCREENING TOMO BILATERAL (Order #[id number]) on 11/09/2016 - Order Result History Report  Vitals  Height Weight BMI (Calculated)  5' (1.524 m) 209 lb 6.4 oz (95 kg) 41  External Result Report  External Result Report  Assessment/Recommendation  Assessment Side Recommendation  Negative [1] Bilateral Follow Up Mamm in 1 Yr.  Imaging  Imaging Information  Signed by  Signed Date/Time   Phone Pager  Ammie Ferrier [facility] 11/09/2016 10:37 AM [phone]   Signed  Electronically signed by Jamie Kato, MD on 11/09/16 at 71 EDT  Original Order  Ordered On Ordered By    11/08/2016 1:56 PM Theodore Demark, RN        CA 125  Order: [id number]  Status:  Final result  Visible to patient:  No (Not Released)  Next appt:   05/09/2017 at 03:15 PM in Oncology ([facility])  Dx:  Endometrial cancer ([place]); Ovarian can...    Ref Range & Units 2d ago   CA 125 0.0 - 38.1 U/mL 15.4   Comment: (NOTE)  Roche ECLIA methodology  Performed At: [facility]  [place], [place] [id number]  Lindon Romp MD ZS:[id number]    Resulting Agency  [facility]    Specimen Collected: 11/08/16 14:44 Last Resulted: 11/09/16 07:40          Navigator Location: [facility] (11/10/16 1052)   )Navigator Encounter Type: Diagnostic Results (11/10/16 1052)                                                    Time Spent with Patient: 15 (11/10/16 1052)

## 2016-11-10 NOTE — Telephone Encounter (Signed)
  Oncology Nurse Navigator Documentation Voicemail left with Ms. Elizabeth Davenport via interpreter Ronnald Collum to retun call for results of CA 125.  CA 125  Order: [id number]  Status:  Final result  Visible to patient:  No (Not Released)  Next appt:  05/09/2017 at 03:15 PM in Oncology ([facility])  Dx:  Endometrial cancer ([place]); Ovarian can...    Ref Range & Units 2d ago   CA 125 0.0 - 38.1 U/mL 15.4   Comment: (NOTE)  Roche ECLIA methodology  Performed At: [facility]  [place], [place] [id number]  Lindon Romp MD PJ:[phone]    Resulting Agency  [facility]    Specimen Collected: 11/08/16 14:44 Last Resulted: 11/09/16 07:40            Navigator Location: [facility] (11/10/16 1000)   )Navigator Encounter Type: Diagnostic Results (11/10/16 1000)                                                    Time Spent with Patient: 15 (11/10/16 1000)

## 2016-11-15 NOTE — Progress Notes (Signed)
Flossie Dibble RN notified patient of negaive mammogram results.  She is to return in one year for annual screening. Copy to [facility].

## 2017-05-09 ENCOUNTER — Inpatient Hospital Stay: Payer: Self-pay

## 2017-05-22 ENCOUNTER — Telehealth: Payer: Self-pay

## 2017-05-22 NOTE — Telephone Encounter (Signed)
  Oncology Nurse Navigator Documentation Spoke with daughter Minna Merritts regarding appt change time. Ms. Siravo can come at 1:30p instead of 3:15p. She will go to the lab then upstairs for physician appt. Interpreter request made. Navigator Location: [facility] (05/22/17 1100)   )Navigator Encounter Type: Telephone (05/22/17 1100) Telephone: Incoming Call;Appt Confirmation/Clarification (05/22/17 1100)                                                  Time Spent with Patient: 15 (05/22/17 1100)

## 2017-05-23 ENCOUNTER — Other Ambulatory Visit: Payer: Self-pay

## 2017-05-23 ENCOUNTER — Inpatient Hospital Stay: Payer: Self-pay | Attending: Obstetrics and Gynecology

## 2017-05-23 ENCOUNTER — Inpatient Hospital Stay: Payer: Self-pay

## 2017-05-23 ENCOUNTER — Inpatient Hospital Stay (HOSPITAL_BASED_OUTPATIENT_CLINIC_OR_DEPARTMENT_OTHER): Payer: Self-pay | Admitting: Obstetrics and Gynecology

## 2017-05-23 VITALS — Ht 60.0 in | Wt 208.7 lb

## 2017-05-23 DIAGNOSIS — Z90722 Acquired absence of ovaries, bilateral: Secondary | ICD-10-CM | POA: Insufficient documentation

## 2017-05-23 DIAGNOSIS — Z8542 Personal history of malignant neoplasm of other parts of uterus: Secondary | ICD-10-CM | POA: Insufficient documentation

## 2017-05-23 DIAGNOSIS — E669 Obesity, unspecified: Secondary | ICD-10-CM | POA: Insufficient documentation

## 2017-05-23 DIAGNOSIS — Z87891 Personal history of nicotine dependence: Secondary | ICD-10-CM | POA: Insufficient documentation

## 2017-05-23 DIAGNOSIS — C569 Malignant neoplasm of unspecified ovary: Secondary | ICD-10-CM

## 2017-05-23 DIAGNOSIS — D3910 Neoplasm of uncertain behavior of unspecified ovary: Secondary | ICD-10-CM

## 2017-05-23 DIAGNOSIS — Z171 Estrogen receptor negative status [ER-]: Secondary | ICD-10-CM

## 2017-05-23 DIAGNOSIS — Z90710 Acquired absence of both cervix and uterus: Secondary | ICD-10-CM | POA: Insufficient documentation

## 2017-05-23 DIAGNOSIS — Z6841 Body Mass Index (BMI) 40.0 and over, adult: Secondary | ICD-10-CM

## 2017-05-23 NOTE — Progress Notes (Signed)
Patient here for follow up. No changes since last appt. 

## 2017-05-23 NOTE — Progress Notes (Signed)
Gynecologic Oncology Interval Note  Chief Complaint: Surveillance for uterine cancer and bilateral ovarian borderline tumor excrescences.  Subjective:   [name] is a pleasant patient who presents for surveillance for uterine cancer and bilateral ovarian borderline tumor excrescences.  She has no complaints today.  She was last seen on 11/08/2016 and had a negative exam.     Normal GI/GU function. Accompanied by interpreter. Her CA125 is pending. She had a mammogram and breast exam today with B[facility]CP.   Last CA125 was normal.     Oncology history: Elizabeth Davenport is a 61 y.o. G2P2 woman who developed light vaginal bleeding in early 2016 after undergoing LMP/menopause in 2014. Dr DeFrancesco did PAP that was normal.  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  CT scan 09/22/14 abd/pelvis negative for metastatic disease.  She was referred to [place] and on exam the cervix was normal.  Slides were reviewed at [facility] and read as probable endometrial primary.  She underwent TLH/BSO Nov 04, 2014 at [facility].  Right ovarian excresence noted at surgery.  Survey of the rest of the abdomen, including the omentum and right diaphragm was negative.  Further staging not performed due to patient's refusal to accept blood products as she is a Jehovah's witness.  Path at [facility] showed 2.6 cm grade 1 endometrial cancer without LVSI or cervical involvement.  The ovaries both were found to have low grade serous cancers of 7 and 8 mm.  No spread noted, but washings were positive.   In view of the path report suggesting primary endometrial and uterine cancers I has the pathology reviewed at [facility].  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  However, the ovarian excresences that were read as low grade serous carcinoma were interpreted as benign serous borderline tumors by the [facility] Pathologists (several of them reviewed  the case). The recommendation was for surveillance.   CA125 values 05/05/15 8.1  11/03/15 12.7  05/10/16 16.2 11/08/2016  15.4   Problem List: Patient Active Problem List   Diagnosis Date Noted  . 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 History of endometrial cancer 11/08/2016  . Serous cystadenoma with borderline malignant features ([place]) 11/08/2016  . 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 Ovarian cancer, bilateral ([place]) 11/25/2014  . Endometrial cancer ([place]) 11/04/2014  . S/P laparoscopic hysterectomy 11/04/2014    Past Medical History: Past Medical History:  Diagnosis Date  . Arthritis    left knee  . Asthma   . Endometrial cancer ([place]) 11/04/14  . PONV (postoperative nausea and vomiting)     Past Surgical History: Past Surgical History:  Procedure Laterality Date  . LAPAROSCOPIC BILATERAL SALPINGO OOPHERECTOMY Bilateral 11/04/2014   Procedure: LAPAROSCOPIC BILATERAL SALPINGO OOPHORECTOMY;  Surgeon: Mellody Drown, MD;  Location: [facility] ORS;  Service: Gynecology;  Laterality: Bilateral;  . LAPAROSCOPIC HYSTERECTOMY N/A 11/04/2014   Procedure: HYSTERECTOMY TOTAL LAPAROSCOPIC;  Surgeon: Mellody Drown, MD;  Location: [facility] ORS;  Service: Gynecology;  Laterality: N/A;  . removed mass from thyriod      Family History: Family History  Problem Relation Age of Onset  . Breast cancer Sister 36    Social History: Social History   Socioeconomic History  . Marital status: Married    Spouse name: Not on file  . Number of children: Not on file  . Years of education: Not on file  . Highest education level: Not  on file  Social Needs  . Financial resource strain: Not on file  . Food insecurity - worry: Not on file  . Food insecurity - inability: Not on file  . Transportation needs - medical: Not on file  . Transportation needs - non-medical: Not on file  Occupational History  . Not on file  Tobacco Use  . Smoking status: Former  Smoker    Years: 8.00    Last attempt to quit: 06/29/1990    Years since quitting: 26.9  . Smokeless tobacco: Never Used  . Tobacco comment: Smoking History Cigarettes 1 cig per day; quit 22 years ago; smoked for 8 years  Substance and Sexual Activity  . Alcohol use: No    Alcohol/week: 0.0 oz  . Drug use: No  . Sexual activity: Yes    Partners: Male  Other Topics Concern  . Not on file  Social History Narrative  . Not on file    Allergies: No Known Allergies  Current Medications: Current Outpatient Medications  Medication Sig Dispense Refill  . albuterol (PROVENTIL HFA;VENTOLIN HFA) 108 (90 BASE) MCG/ACT inhaler Inhale 2 puffs into the lungs every 6 (six) hours as needed for wheezing or shortness of breath.     No current facility-administered medications for this visit.     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   Extremities: no complaints  Skin: no complaints  Neuro: burning pain in hands  Endocrine: no complaints  Psych: no complaints      Objective:  Physical Examination:  Ht 5' (1.524 m)   Wt 208 lb 11.2 oz (94.7 kg)   LMP 11/04/2011   BMI 40.76 kg/m    Ht 5' (1.524 m)   Wt 208 lb 11.2 oz (94.7 kg)   LMP 11/04/2011   BMI 40.76 kg/m    ECOG Performance Status: 0 - Asymptomatic  General appearance: alert, cooperative and appears stated age HEENT:PERRLA, extra ocular movement intact Lymph node survey: negative Cardiovascular: normal rate and rhythm Respiratory: clear to auscultation Breast exam: deferred Abdomen: soft, non-tender, without masses or organomegaly, no hernias and well healed incisions Back: inspection of back is normal Extremities: extremities normal, atraumatic, no cyanosis or edema Skin exam: normal coloration  and turgor, no rashes, no suspicious skin lesions noted. Neurological exam reveals: no focal findings or movement disorder noted. Abdomen: laparoscopy incisions well healed.    Pelvic: Chaperoned by RN. EGBUS/Vagina:Cuff well healed.Bimanual: no masses or tenderness. RV deferred.   Assessment:  Elizabeth Davenport is a 61 y.o. 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 NED. Obesity.   Jehovah's witness.  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 Plan for CA125 today.   Her weight is stable.  We previously provided education for obesity, lifestyle, exercise, nutrition. I had previously offered referral to the CARE program and provided a pamphlet.    I previously recommended continued close follow up with exams, including pelvic exams every 3-6 months for 2-3 years,  then every 6-12 months for 3-5 years and then annually thereafter. CA125 will be obtained for surveillance of borderline tumor. Imaging and laboratory assessment is based on clinical indication.   I personally reviewed pertinent history, ROS, and examined the patient in conjunction with Beckey Rutter, NP.    Gillis Ends, MD  CC:  Dr. Rubie Maid

## 2017-05-24 LAB — CA 125: Cancer Antigen (CA) 125: 30.6 U/mL (ref 0.0–38.1)

## 2017-05-28 ENCOUNTER — Telehealth: Payer: Self-pay

## 2017-05-28 DIAGNOSIS — D3910 Neoplasm of uncertain behavior of unspecified ovary: Secondary | ICD-10-CM

## 2017-05-28 NOTE — Telephone Encounter (Signed)
Voicemail left via interpreter Ronnald Collum for Ms. Preez to return call. We need to give her results of Ca 125 and have her return in 6 weeks for repeat Ca 125. Oncology Nurse Navigator Documentation  Navigator Location: [facility] (05/28/17 1300)   )Navigator Encounter Type: Telephone (05/28/17 1300) Telephone: Lahoma Crocker Call;Diagnostic Results;Appt Confirmation/Clarification (05/28/17 1300)                                                  Time Spent with Patient: 15 (05/28/17 1300)

## 2017-05-28 NOTE — Telephone Encounter (Signed)
Ms. Putnam had her daughter return call for Ca 125 results. Results given and informed of need for repeat in 6 weeks on 1/14 at 0930. Lab only appointment. Oncology Nurse Navigator Documentation  Navigator Location: [facility] (05/28/17 1500)   )Navigator Encounter Type: Telephone (05/28/17 1500) Telephone: Incoming Call;Diagnostic Results;Appt Confirmation/Clarification (05/28/17 1500)                                                  Time Spent with Patient: 15 (05/28/17 1500)

## 2017-07-09 ENCOUNTER — Inpatient Hospital Stay: Payer: Self-pay | Attending: Obstetrics and Gynecology

## 2017-07-09 DIAGNOSIS — D3910 Neoplasm of uncertain behavior of unspecified ovary: Secondary | ICD-10-CM

## 2017-07-09 DIAGNOSIS — Z8542 Personal history of malignant neoplasm of other parts of uterus: Secondary | ICD-10-CM | POA: Insufficient documentation

## 2017-07-10 LAB — CA 125: Cancer Antigen (CA) 125: 25.2 U/mL (ref 0.0–38.1)

## 2017-07-20 ENCOUNTER — Telehealth: Payer: Self-pay

## 2017-07-20 NOTE — Telephone Encounter (Signed)
Spoke with Ms. Chapa's daughter with interpreter, Ronnald Collum, on the line. Gave her results of CA 125 and instructed to have her mother keep follow up in May. Verbalized understanding. CA 125  Order: [id number]  Status:  Final result  Visible to patient:  No (Not Released)  Next appt:  11/21/2017 at 01:15 PM in Oncology ([facility])  Dx:  Ovarian tumor of borderline malignanc...   Ref Range & Units 11d ago  Cancer Antigen (CA) 125 0.0 - 38.1 U/mL 25.2         Oncology Nurse Navigator Documentation  Navigator Location: [facility] (07/20/17 1300)   )Navigator Encounter Type: Telephone (07/20/17 1300) Telephone: Outgoing Call;Diagnostic Results (07/20/17 1300)                   Patient Visit Type: GynOnc (07/20/17 1300)                              Time Spent with Patient: 15 (07/20/17 1300)

## 2017-10-26 ENCOUNTER — Telehealth: Payer: Self-pay | Admitting: *Deleted

## 2017-10-26 NOTE — Telephone Encounter (Signed)
Left VM message for patient's daughter to call us back if she was still having problems.     dhs

## 2017-10-26 NOTE — Telephone Encounter (Signed)
Daughter called to report that patient is complaining of pressure in her left abdomen and that she is having pressure when she urinates. Asking what to do. Please advise. Return call [phone]

## 2017-10-31 ENCOUNTER — Emergency Department: Payer: Self-pay

## 2017-10-31 ENCOUNTER — Other Ambulatory Visit: Payer: Self-pay

## 2017-10-31 ENCOUNTER — Emergency Department
Admission: EM | Admit: 2017-10-31 | Discharge: 2017-10-31 | Disposition: A | Discharge status: Home / Self Care | Payer: Self-pay | Attending: Emergency Medicine | Admitting: Emergency Medicine

## 2017-10-31 ENCOUNTER — Encounter: Payer: Self-pay | Admitting: *Deleted

## 2017-10-31 DIAGNOSIS — J45909 Unspecified asthma, uncomplicated: Secondary | ICD-10-CM | POA: Insufficient documentation

## 2017-10-31 DIAGNOSIS — C561 Malignant neoplasm of right ovary: Secondary | ICD-10-CM | POA: Insufficient documentation

## 2017-10-31 DIAGNOSIS — D3910 Neoplasm of uncertain behavior of unspecified ovary: Secondary | ICD-10-CM

## 2017-10-31 DIAGNOSIS — C541 Malignant neoplasm of endometrium: Secondary | ICD-10-CM

## 2017-10-31 DIAGNOSIS — C562 Malignant neoplasm of left ovary: Secondary | ICD-10-CM | POA: Insufficient documentation

## 2017-10-31 DIAGNOSIS — R1032 Left lower quadrant pain: Secondary | ICD-10-CM | POA: Insufficient documentation

## 2017-10-31 DIAGNOSIS — Z87891 Personal history of nicotine dependence: Secondary | ICD-10-CM | POA: Insufficient documentation

## 2017-10-31 LAB — COMPREHENSIVE METABOLIC PANEL
ALK PHOS: 52 U/L (ref 38–126)
ALT: 22 U/L (ref 14–54)
AST: 22 U/L (ref 15–41)
Albumin: 3.4 g/dL — ABNORMAL LOW (ref 3.5–5.0)
Anion gap: 8 (ref 5–15)
BILIRUBIN TOTAL: 0.6 mg/dL (ref 0.3–1.2)
BUN: 12 mg/dL (ref 6–20)
CALCIUM: 8.3 mg/dL — AB (ref 8.9–10.3)
CO2: 25 mmol/L (ref 22–32)
Chloride: 106 mmol/L (ref 101–111)
Creatinine, Ser: 0.41 mg/dL — ABNORMAL LOW (ref 0.44–1.00)
GFR calc Af Amer: >60 mL/min (ref >60)
GFR calc non Af Amer: >60 mL/min (ref >60)
GLUCOSE: 112 mg/dL — AB (ref 65–99)
Potassium: 3.8 mmol/L (ref 3.5–5.1)
Sodium: 139 mmol/L (ref 135–145)
Total Protein: 7.3 g/dL (ref 6.5–8.1)

## 2017-10-31 LAB — URINALYSIS, COMPLETE (UACMP) WITH MICROSCOPIC
Bacteria, UA: NONE SEEN
Specific Gravity, Urine: 1.026 (ref 1.005–1.030)

## 2017-10-31 LAB — CBC
HCT: 34.7 % — ABNORMAL LOW (ref 35.0–47.0)
Hemoglobin: 11.7 g/dL — ABNORMAL LOW (ref 12.0–16.0)
MCH: 29 pg (ref 26.0–34.0)
MCHC: 33.7 g/dL (ref 32.0–36.0)
MCV: 86 fL (ref 80.0–100.0)
Platelets: 302 10*3/uL (ref 150–440)
RBC: 4.04 MIL/uL (ref 3.80–5.20)
RDW: 13.5 % (ref 11.5–14.5)
WBC: 7.2 10*3/uL (ref 3.6–11.0)

## 2017-10-31 LAB — LIPASE, BLOOD: Lipase: 25 U/L (ref 11–51)

## 2017-10-31 MED ORDER — OXYCODONE-ACETAMINOPHEN 5-325 MG PO TABS: 1.0000 | ORAL_TABLET | ORAL | 0 refills | Status: DC | PRN

## 2017-10-31 MED ORDER — OXYCODONE-ACETAMINOPHEN 5-325 MG PO TABS
1.0000 | ORAL_TABLET | Freq: Once | ORAL | Status: AC
  Administered 2017-10-31: 1 via ORAL

## 2017-10-31 MED ORDER — IOPAMIDOL (ISOVUE-370) INJECTION 76%
75.0000 mL | Freq: Once | INTRAVENOUS | Status: AC | PRN
  Administered 2017-10-31: 75 mL via INTRAVENOUS

## 2017-10-31 MED ORDER — OXYCODONE-ACETAMINOPHEN 5-325 MG PO TABS
ORAL_TABLET | ORAL | Status: AC
  Filled 2017-10-31: qty 1

## 2017-10-31 MED ORDER — IOPAMIDOL (ISOVUE-300) INJECTION 61%
30.0000 mL | Freq: Once | INTRAVENOUS | Status: AC | PRN
  Administered 2017-10-31: 30 mL via ORAL

## 2017-10-31 MED ORDER — ONDANSETRON HCL 4 MG/2ML IJ SOLN
4.0000 mg | Freq: Once | INTRAMUSCULAR | Status: AC
  Administered 2017-10-31: 4 mg via INTRAVENOUS
  Filled 2017-10-31: qty 2

## 2017-10-31 MED ORDER — MORPHINE SULFATE (PF) 4 MG/ML IV SOLN
4.0000 mg | Freq: Once | INTRAVENOUS | Status: AC
  Administered 2017-10-31: 4 mg via INTRAVENOUS
  Filled 2017-10-31: qty 1

## 2017-10-31 MED ORDER — ONDANSETRON 4 MG PO TBDP: 4.0000 mg | ORAL_TABLET | Freq: Three times a day (TID) | ORAL | 0 refills | Status: DC | PRN

## 2017-10-31 NOTE — ED Provider Notes (Signed)
[facility] Emergency Department Provider Note  Time seen: 3:43 AM  I have reviewed the triage vital signs and the nursing notes.  Spanish interpreter used during this evaluation.  HISTORY  Chief Complaint Abdominal Pain    HPI Elizabeth Davenport is a 62 y.o. female with a past medical history of arthritis, asthma, presents to the emergency department for left lower quadrant abdominal pain.  According to the patient for the past 1 week she has been expensing left lower quadrant abdominal pain.  Went to her doctor last week was diagnosed with a possible urinary tract infection.  Patient denies any dysuria.  Patient denies diarrhea, states fairly normal bowel movement denies black or bloody stool.  States nausea but denies vomiting.  States the pain was worse today so she came to the emergency department for evaluation.  No fever, describes as a 7/10 dull aching pain across the lower abdomen mostly in the left lower quadrant.  No history of colitis or diverticulitis previously.   Past Medical History:  Diagnosis Date  . Arthritis    left knee  . Asthma   . Endometrial cancer ([place]) 11/04/14  . PONV (postoperative nausea and vomiting)     Patient Active Problem List   Diagnosis Date Noted  . 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 History of endometrial cancer 11/08/2016  . 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 Endometrial cancer ([place]) 11/04/2014  . S/P laparoscopic hysterectomy 11/04/2014    Past Surgical History:  Procedure Laterality Date  . LAPAROSCOPIC BILATERAL SALPINGO OOPHERECTOMY Bilateral 11/04/2014   Procedure: LAPAROSCOPIC BILATERAL SALPINGO OOPHORECTOMY;  Surgeon: Mellody Drown, MD;  Location: [facility] ORS;  Service: Gynecology;  Laterality: Bilateral;  . LAPAROSCOPIC HYSTERECTOMY N/A  11/04/2014   Procedure: HYSTERECTOMY TOTAL LAPAROSCOPIC;  Surgeon: Mellody Drown, MD;  Location: [facility] ORS;  Service: Gynecology;  Laterality: N/A;  . removed mass from thyriod      Prior to Admission medications   Medication Sig Start Date End Date Taking? Authorizing Provider  albuterol (PROVENTIL HFA;VENTOLIN HFA) 108 (90 BASE) MCG/ACT inhaler Inhale 2 puffs into the lungs every 6 (six) hours as needed for wheezing or shortness of breath.    [provider]    No Known Allergies  Family History  Problem Relation Age of Onset  . Breast cancer Sister 66    Social History Social History   Tobacco Use  . Smoking status: Former Smoker    Years: 8.00    Last attempt to quit: 06/29/1990    Years since quitting: 27.3  . Smokeless tobacco: Never Used  . Tobacco comment: Smoking History Cigarettes 1 cig per day; quit 22 years ago; smoked for 8 years  Substance Use Topics  . Alcohol use: No    Alcohol/week: 0.0 oz  . Drug use: No    Review of Systems Constitutional: Negative for fever. Eyes: Negative for visual complaints ENT: Negative for recent illness/congestion Cardiovascular: Negative for chest pain. Respiratory: Negative for shortness of breath. Gastrointestinal: Left lower quadrant abdominal pain.  Positive for nausea Genitourinary: Negative for urinary compaints Musculoskeletal: Negative for musculoskeletal complaints Skin: Negative for skin complaints  Neurological: Negative for headache All other ROS negative  ____________________________________________   PHYSICAL EXAM:  VITAL SIGNS: ED Triage Vitals  Enc Vitals Group     BP 10/31/17 0149 138/77     Pulse Rate 10/31/17 0149  87     Resp 10/31/17 0149 20     Temp 10/31/17 0149 98.5 F (36.9 C)     Temp Source 10/31/17 0149 Oral     SpO2 10/31/17 0149 97 %     Weight 10/31/17 0147 205 lb (93 kg)     Height 10/31/17 0147 5' (1.524 m)     Head Circumference --      Peak Flow --      Pain Score  10/31/17 0146 7     Pain Loc --      Pain Edu? --      Excl. in [place]? --     Constitutional: Alert and oriented. Well appearing and in no distress. Eyes: Normal exam ENT   Head: Normocephalic and atraumatic.   Mouth/Throat: Mucous membranes are moist. Cardiovascular: Normal rate, regular rhythm. No murmurs, rubs, or gallops. Respiratory: Normal respiratory effort without tachypnea nor retractions. Breath sounds are clear  Gastrointestinal: Soft, mild suprapubic tenderness moderate left lower quadrant tenderness.  No rebound or guarding.  No distention.  Abdomen otherwise benign. Musculoskeletal: Nontender with normal range of motion in all extremities.  Neurologic:  Normal speech and language. No gross focal neurologic deficits Skin:  Skin is warm, dry and intact.  Psychiatric: Mood and affect are normal.  ____________________________________________    RADIOLOGY  IMPRESSION: 1. Interval development of omental and peritoneal implants/metastatic disease and small amount of ascites. 2. Mildly rounded nodular densities/lymph nodes at the cardiophrenic angles, new compared to prior CT and concerning for metastatic disease. 3. Sigmoid diverticulosis. No bowel obstruction or active inflammation. Normal appendix.  ____________________________________________   INITIAL IMPRESSION / ASSESSMENT AND PLAN / ED COURSE  Pertinent labs & imaging results that were available during my care of the patient were reviewed by me and considered in my medical decision making (see chart for details).  Patient presents to the emergency department for left lower quadrant abdominal pain x1 week.  Differential includes colitis, diverticulitis, urinary tract infection, kidney stone.  Patient's labs are largely within normal limits.  Urine is discolored but no white blood cells or bacteria seen.  Given the moderate tenderness we will obtain CT imaging of the abdomen/pelvis to further evaluate.  Highly  suspect diverticulitis.  CT scan appears to unfortunately show a cancerous process involving the omentum as well as multiple enlarged lymph nodes throughout the abdomen and pelvis.  I discussed this with the patient with Spanish interpreter, patient states that she had a hysterectomy previously for uterine cancer and they have been checking her every 6 months but never had any recurrence of the disease.  This could possibly be related.  Patient's pain is controlled at this time.  I discussed the patient with oncology they will see the patient tomorrow in the office.  They are going to call the patient today to arrange the follow-up appointment for tomorrow.  Patient agreeable to this plan of care.  ____________________________________________   FINAL CLINICAL IMPRESSION(S) / ED DIAGNOSES  Left lower quadrant abdominal pain    Harvest Dark, MD 10/31/17 [phone]

## 2017-10-31 NOTE — ED Notes (Signed)
Interpreter requested for discharge 

## 2017-10-31 NOTE — ED Notes (Signed)
Interpreter used for discharge. Requesting pain medications prior to discharge. EDP informed, orders received. Pt alert and oriented X4, active, cooperative, pt in NAD. RR even and unlabored, color WNL.  Discharge and followup instructions reviewed. Pt informed to return if any life threatening symptoms occur.

## 2017-10-31 NOTE — ED Notes (Signed)
Pt returned from CT °

## 2017-10-31 NOTE — ED Triage Notes (Signed)
Pt ambulatory to triage.  Pt has left lower abd pain for 3 days.   Dx recently with uti and continues to have urinary frequency.

## 2017-10-31 NOTE — Discharge Instructions (Signed)
Please follow-up with oncology tomorrow for further work-up.  They should be calling you with this morning with an appointment time for tomorrow.  If you do not hear from them by lunchtime today please call their office at the number provided.  Return to the emergency department for any worsening abdominal pain, or any other symptom personally concerning to yourself.

## 2017-10-31 NOTE — Progress Notes (Signed)
Noted visit to ED. Elizabeth Davenport previously scheduled for her 6 month follow up 5/29 with Gyn Onc. Spoke to Dr. Theora Gianotti this am and orders placed for CT guided biopsy of omental caking. Called and spoke with Elizabeth Davenport utilizing Ronnald Collum, from interpreter services. She was notified and educated on biopsy being ordered. Once it is scheduled we will call her back with date/time/instructions. She will also be arranged to see Medical Oncology and keep Gyn Onc as previously scheduled. All questions answered. Spoke with Dr. Janese Banks regarding plan. Oncology Nurse Navigator Documentation  Navigator Location: [facility] (10/31/17 0900)   )Navigator Encounter Type: Diagnostic Results (10/31/17 0900)

## 2017-11-01 ENCOUNTER — Telehealth: Payer: Self-pay

## 2017-11-01 NOTE — Telephone Encounter (Signed)
Returned call to daughter, Oley Balm. Biopsy is still in the process of being scheduled. She will be notified once scheduled of the date/time/instructions. She will see physician following for results. This will be arranged once we have date of biopsy. She is aware that her 5/29 previously scheduled appointment will be moved up. Oncology Nurse Navigator Documentation  Navigator Location: [facility] (11/01/17 1400)   )Navigator Encounter Type: Telephone (11/01/17 1400) Telephone: [place] Call (11/01/17 1400)                                                  Time Spent with Patient: 15 (11/01/17 1400)

## 2017-11-02 ENCOUNTER — Telehealth: Payer: Self-pay

## 2017-11-02 NOTE — Telephone Encounter (Signed)
Called and spoke with Elizabeth Davenport in special scheduling regarding Ct biopsy. It has been approved and is pending scheduling. Oncology Nurse Navigator Documentation  Navigator Location: [facility] (11/02/17 0900)   )Navigator Encounter Type: Telephone (11/02/17 0900) Telephone: Outgoing Call (11/02/17 0900)                                                  Time Spent with Patient: 15 (11/02/17 0900)

## 2017-11-05 ENCOUNTER — Telehealth: Payer: Self-pay

## 2017-11-05 ENCOUNTER — Other Ambulatory Visit: Payer: Self-pay

## 2017-11-05 DIAGNOSIS — C541 Malignant neoplasm of endometrium: Secondary | ICD-10-CM

## 2017-11-05 NOTE — Telephone Encounter (Signed)
Using [place] from interpreter services, instructions given for CT guided biopsy. [facility] at the medical mall entrance on 5/15 at 0930 and proceed to the registration desk. They will have an interpreter meet with her an explain how to drink her contrast needed prior to the biopsy. Her actual biopsy is scheduled for 1130 but it is very important for her to arrive at 0930 for the contrast. Denies taking any medication for BP. Educated further on biopsy process and what to expect. Appointment being arranged to see medical oncology for the results. We will call her with this once arranged. Appointment with Gyn Onc moved up to 5/22. Oncology Nurse Navigator Documentation  Navigator Location: [facility] (11/05/17 1000)   )Navigator Encounter Type: Telephone (11/05/17 1000) Telephone: Incoming Call;Appt Confirmation/Clarification;Education (11/05/17 1000)                                                  Time Spent with Patient: 15 (11/05/17 1000)

## 2017-11-05 NOTE — Telephone Encounter (Signed)
Received call from daughter this am. They are under the impression that they have an appointment today. No appointments have been scheduled at the cancer center. Biopsy still in process of being scheduled. We are planning on seeing her 2 days after biopsy for there results and plan. She verbalized understanding. Orders for biopsy were placed on 5/8. Oncology Nurse Navigator Documentation  Navigator Location: [facility] (11/05/17 0800)   )Navigator Encounter Type: Telephone (11/05/17 0800) Telephone: Incoming Call (11/05/17 0800)                                                  Time Spent with Patient: 15 (11/05/17 0800)

## 2017-11-06 ENCOUNTER — Other Ambulatory Visit: Payer: Self-pay | Admitting: Radiology

## 2017-11-07 ENCOUNTER — Ambulatory Visit
Admission: RE | Admit: 2017-11-07 | Discharge: 2017-11-07 | Disposition: A | Discharge status: Home / Self Care | Payer: Self-pay | Source: Ambulatory Visit | Attending: Obstetrics and Gynecology | Admitting: Obstetrics and Gynecology

## 2017-11-07 DIAGNOSIS — Z8543 Personal history of malignant neoplasm of ovary: Secondary | ICD-10-CM | POA: Insufficient documentation

## 2017-11-07 DIAGNOSIS — N281 Cyst of kidney, acquired: Secondary | ICD-10-CM | POA: Insufficient documentation

## 2017-11-07 DIAGNOSIS — D3910 Neoplasm of uncertain behavior of unspecified ovary: Secondary | ICD-10-CM

## 2017-11-07 DIAGNOSIS — K5730 Diverticulosis of large intestine without perforation or abscess without bleeding: Secondary | ICD-10-CM | POA: Insufficient documentation

## 2017-11-07 DIAGNOSIS — Z90722 Acquired absence of ovaries, bilateral: Secondary | ICD-10-CM | POA: Insufficient documentation

## 2017-11-07 DIAGNOSIS — K449 Diaphragmatic hernia without obstruction or gangrene: Secondary | ICD-10-CM | POA: Insufficient documentation

## 2017-11-07 DIAGNOSIS — Z8542 Personal history of malignant neoplasm of other parts of uterus: Secondary | ICD-10-CM | POA: Insufficient documentation

## 2017-11-07 DIAGNOSIS — Z90710 Acquired absence of both cervix and uterus: Secondary | ICD-10-CM | POA: Insufficient documentation

## 2017-11-07 DIAGNOSIS — Z87891 Personal history of nicotine dependence: Secondary | ICD-10-CM | POA: Insufficient documentation

## 2017-11-07 DIAGNOSIS — J45909 Unspecified asthma, uncomplicated: Secondary | ICD-10-CM | POA: Insufficient documentation

## 2017-11-07 DIAGNOSIS — C786 Secondary malignant neoplasm of retroperitoneum and peritoneum: Secondary | ICD-10-CM | POA: Insufficient documentation

## 2017-11-07 DIAGNOSIS — Z79899 Other long term (current) drug therapy: Secondary | ICD-10-CM | POA: Insufficient documentation

## 2017-11-07 DIAGNOSIS — Z9079 Acquired absence of other genital organ(s): Secondary | ICD-10-CM | POA: Insufficient documentation

## 2017-11-07 DIAGNOSIS — C541 Malignant neoplasm of endometrium: Secondary | ICD-10-CM

## 2017-11-07 LAB — CBC WITH DIFFERENTIAL/PLATELET
BASOS ABS: 0 10*3/uL (ref 0–0.1)
BASOS PCT: 0 %
Eosinophils Absolute: 0.2 10*3/uL (ref 0–0.7)
Eosinophils Relative: 4 %
HEMATOCRIT: 36 % (ref 35.0–47.0)
Hemoglobin: 12.2 g/dL (ref 12.0–16.0)
LYMPHS PCT: 22 %
Lymphs Abs: 1.3 10*3/uL (ref 1.0–3.6)
MCH: 28.8 pg (ref 26.0–34.0)
MCHC: 34 g/dL (ref 32.0–36.0)
MCV: 84.8 fL (ref 80.0–100.0)
Monocytes Absolute: 0.3 10*3/uL (ref 0.2–0.9)
Monocytes Relative: 5 %
NEUTROS ABS: 4.1 10*3/uL (ref 1.4–6.5)
Neutrophils Relative %: 69 %
PLATELETS: 357 10*3/uL (ref 150–440)
RBC: 4.24 MIL/uL (ref 3.80–5.20)
RDW: 13.7 % (ref 11.5–14.5)
WBC: 5.9 10*3/uL (ref 3.6–11.0)

## 2017-11-07 LAB — PROTIME-INR
INR: 1.1
Prothrombin Time: 14.1 seconds (ref 11.4–15.2)

## 2017-11-07 MED ORDER — LIDOCAINE HCL (PF) 1 % IJ SOLN
INTRAMUSCULAR | Status: AC | PRN
  Administered 2017-11-07: 10 mL

## 2017-11-07 MED ORDER — HYDROCODONE-ACETAMINOPHEN 5-325 MG PO TABS
1.0000 | ORAL_TABLET | ORAL | Status: DC | PRN
  Filled 2017-11-07: qty 2

## 2017-11-07 MED ORDER — FENTANYL CITRATE (PF) 100 MCG/2ML IJ SOLN
INTRAMUSCULAR | Status: AC | PRN
  Administered 2017-11-07 (×2): 25 ug via INTRAVENOUS
  Administered 2017-11-07: 50 ug via INTRAVENOUS

## 2017-11-07 MED ORDER — SODIUM CHLORIDE 0.9 % IV SOLN
INTRAVENOUS | Status: DC
  Administered 2017-11-07: 10:00:00 via INTRAVENOUS

## 2017-11-07 MED ORDER — FENTANYL CITRATE (PF) 100 MCG/2ML IJ SOLN
INTRAMUSCULAR | Status: AC
  Filled 2017-11-07: qty 4

## 2017-11-07 NOTE — H&P (Signed)
Chief Complaint: Patient was seen in consultation today for an image guided omental biopsy at the request of Gillis Ends  Referring Physician(s): Gillis Ends  Patient Status: [facility] - Out-pt  History of Present Illness: Elizabeth Davenport is a 62 y.o. 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  Patient recently went to the emergency department for abdominal pain.  CT of the abdomen and pelvis demonstrated omental thickening and highly concerning for neoplastic or metastatic disease.  Patient presents for image guided omental biopsy.  I interviewed and examined the patient with an interpreter.  The pain in the left lower abdomen has resolved.  She complains of abdominal distention throughout the day.  She feels like she may be running fevers but she has not checked her temperature.  She complains of generalized fatigue.  No significant change in her appetite or weight.  She denies chest pain or difficulty breathing.  No focal neurologic abnormalities.  Past Medical History:  Diagnosis Date  . Arthritis    left knee  . Asthma   . Endometrial cancer ([place]) 11/04/14  . PONV (postoperative nausea and vomiting)     Past Surgical History:  Procedure Laterality Date  . LAPAROSCOPIC BILATERAL SALPINGO OOPHERECTOMY Bilateral 11/04/2014   Procedure: LAPAROSCOPIC BILATERAL SALPINGO OOPHORECTOMY;  Surgeon: Mellody Drown, MD;  Location: [facility] ORS;  Service: Gynecology;  Laterality: Bilateral;  . LAPAROSCOPIC HYSTERECTOMY N/A 11/04/2014   Procedure: HYSTERECTOMY TOTAL LAPAROSCOPIC;  Surgeon: Mellody Drown, MD;  Location: [facility] ORS;  Service: Gynecology;  Laterality: N/A;  . removed mass from thyriod      Allergies: Patient has no known allergies.  Medications: Prior to Admission medications   Medication Sig Start Date End Date Taking? Authorizing Provider  albuterol (PROVENTIL HFA;VENTOLIN HFA) 108 (90 BASE) MCG/ACT inhaler Inhale 2 puffs  into the lungs every 6 (six) hours as needed for wheezing or shortness of breath.   Yes [provider]  ondansetron (ZOFRAN ODT) 4 MG disintegrating tablet Take 1 tablet (4 mg total) by mouth every 8 (eight) hours as needed for nausea or vomiting. 10/31/17  Yes Harvest Dark, MD  oxyCODONE-acetaminophen (PERCOCET) 5-325 MG tablet Take 1 tablet by mouth every 4 (four) hours as needed for severe pain. 10/31/17  Yes Harvest Dark, MD     Family History  Problem Relation Age of Onset  . Breast cancer Sister 54    Social History   Socioeconomic History  . Marital status: Married    Spouse name: Not on file  . Number of children: Not on file  . Years of education: Not on file  . Highest education level: Not on file  Occupational History  . Not on file  Social Needs  . Financial resource strain: Not on file  . Food insecurity:    Worry: Not on file    Inability: Not on file  . Transportation needs:    Medical: Not on file    Non-medical: Not on file  Tobacco Use  . Smoking status: Former Smoker    Years: 8.00    Last attempt to quit: 06/29/1990    Years since quitting: 27.3  . Smokeless tobacco: Never Used  . Tobacco comment: Smoking History Cigarettes 1 cig per day; quit 22 years ago; smoked for 8 years  Substance and Sexual Activity  . Alcohol use: No    Alcohol/week: 0.0 oz  . Drug use: No  . Sexual activity: Yes    Partners: Male  Lifestyle  . Physical  activity:    Days per week: Not on file    Minutes per session: Not on file  . Stress: Not on file  Relationships  . Social connections:    Talks on phone: Not on file    Gets together: Not on file    Attends religious service: Not on file    Active member of club or organization: Not on file    Attends meetings of clubs or organizations: Not on file    Relationship status: Not on file  Other Topics Concern  . Not on file  Social History Narrative  . Not on file    Review of Systems: A 12 point ROS  discussed and pertinent positives are indicated in the HPI above.  All other systems are negative.  Review of Systems  Constitutional: Positive for fatigue.  Respiratory: Negative.   Cardiovascular: Negative.   Gastrointestinal: Positive for abdominal distention and abdominal pain.    Vital Signs: BP (!) 139/97   Pulse 76   Temp 97.7 F (36.5 C) (Oral)   Resp 20   Ht 5' (1.524 m)   Wt 200 lb (90.7 kg)   LMP 11/04/2011   SpO2 100%   BMI 39.06 kg/m   Physical Exam  Constitutional: No distress.  HENT:  Mouth/Throat: Oropharynx is clear and moist.  Cardiovascular: Normal rate, regular rhythm and normal heart sounds.  Pulmonary/Chest: Effort normal and breath sounds normal.  Abdominal: Soft. Bowel sounds are normal. She exhibits no distension. There is no tenderness.    Imaging: Ct Abdomen Pelvis W Contrast  Result Date: 10/31/2017 CLINICAL DATA:  62 year old female with abdominal pain. Concern for acute diverticulitis. History of endometrial carcinoma. EXAM: CT ABDOMEN AND PELVIS WITH CONTRAST TECHNIQUE: Multidetector CT imaging of the abdomen and pelvis was performed using the standard protocol following bolus administration of intravenous contrast. CONTRAST:  41mL ISOVUE-370 IOPAMIDOL (ISOVUE-370) INJECTION 76% COMPARISON:  Abdominal CT dated 09/22/2014 FINDINGS: Lower chest: The visualized lung bases are clear. Multiple mildly rounded lymph nodes in the right cardiophrenic angle are new compared to prior CT. These measure up to 7 mm in diameter (series 2, image 14). A cluster of smaller nodular densities noted at the left cardiophrenic angle (series 2, image 20) also new. No intra-abdominal free air. There is a small upper abdominal and pelvic free fluid, new compared to the prior CT. Hepatobiliary: Scattered subcentimeter hepatic hypodense lesions are too small to characterize but appears similar to prior CT, likely cysts. No intrahepatic biliary ductal dilatation. The gallbladder is  unremarkable. Pancreas: Unremarkable. No pancreatic ductal dilatation or surrounding inflammatory changes. Spleen: Normal in size without focal abnormality. Adrenals/Urinary Tract: The adrenal glands are unremarkable. There is a 4.5 cm right renal cyst. Smaller right renal hypodense lesion is too small to characterize. The left kidney is unremarkable. There is symmetric enhancement and excretion of contrast by both kidneys. The visualized ureters and urinary bladder appear unremarkable. Stomach/Bowel: There are scattered sigmoid diverticula without active inflammatory changes. There is a small hiatal hernia. There is no bowel obstruction or active inflammation. Normal appendix. Vascular/Lymphatic: There is mild aortoiliac atherosclerotic disease. No portal venous gas. Retroaortic left renal vein anatomy. No adenopathy. Reproductive: Hysterectomy. Other: There is extensive omental nodularity and caking, new compared to the prior CT consistent with omental implants. Multiple small nodularities in the left upper quadrant under the left hemidiaphragm (series 2, image 23 consistent with peritoneal implants. Musculoskeletal: No acute or significant osseous findings. IMPRESSION: 1. Interval development of omental and peritoneal  implants/metastatic disease and small amount of ascites. 2. Mildly rounded nodular densities/lymph nodes at the cardiophrenic angles, new compared to prior CT and concerning for metastatic disease. 3. Sigmoid diverticulosis. No bowel obstruction or active inflammation. Normal appendix. Electronically Signed   By: Anner Crete M.D.   On: 10/31/2017 06:36    Labs:  CBC: Recent Labs    10/31/17 0149 11/07/17 0939  WBC 7.2 5.9  HGB 11.7* 12.2  HCT 34.7* 36.0  PLT 302 357    COAGS: Recent Labs    11/07/17 0939  INR 1.10    BMP: Recent Labs    10/31/17 0149  NA 139  K 3.8  CL 106  CO2 25  GLUCOSE 112*  BUN 12  CALCIUM 8.3*  CREATININE 0.41*  GFRNONAA >60  GFRAA >60      LIVER FUNCTION TESTS: Recent Labs    10/31/17 0149  BILITOT 0.6  AST 22  ALT 22  ALKPHOS 52  PROT 7.3  ALBUMIN 3.4*    TUMOR MARKERS: No results for input(s): AFPTM, CEA, CA199, CHROMGRNA in the last 8760 hours.  Assessment and Plan:  Mrs. Ottaway is a very pleasant 62 year old female with a history of endometrial cancer and low-grade serous ovarian cancer.  Unfortunately, her recent imaging raises concern for recurrent or metastatic disease with omental thickening.  Patient presents for CT-guided omental biopsy.  I explained the procedure and went over the CT images with the patient and her family.  Risks of the procedure include bleeding, infection and bowel injury.  They appear to have a very good understanding of the current situation.  We have elected to give the patient oral contrast in order to visualize the bowel better during the omental biopsy.  Due to the oral contrast, the patient will not be able to get moderate sedation.  However, we will plan to give IV pain medication and local anesthetic.  Patient is comfortable with this plan.  Plan for CT-guided omental biopsy.  Thank you for this interesting consult.  I greatly enjoyed meeting Katalaya Beel and look forward to participating in their care.  A copy of this report was sent to the requesting provider on this date.  Electronically Signed: Burman Riis, MD 11/07/2017, 10:56 AM   I spent a total of  15 Minutes   in face to face in clinical consultation, greater than 50% of which was counseling/coordinating care for an omental biopsy.

## 2017-11-07 NOTE — Procedures (Signed)
CT guided core biopsies of the omental disease.  4 cores obtained.  Minimal blood loss and no immediate complication.

## 2017-11-07 NOTE — Sedation Documentation (Signed)
Communicating with patient via Development worker, international aid. On phone.

## 2017-11-07 NOTE — Progress Notes (Signed)
Met with Elizabeth Davenport and her daughter, [name], and spouse, [name], in Special Procedures. Upcoming appointments went over for Dr. Grayland Ormond and Dr. Fransisca Connors. Printed calendar provided. We went over pathology from 2016 and Ca 125 education again. All questions answered. Oncology Nurse Navigator Documentation  Navigator Location: [facility] (11/07/17 1100)   )Navigator Encounter Type: Other(Special Procedures) (11/07/17 1100) Telephone: Appt Confirmation/Clarification (11/07/17 1100)                                                  Time Spent with Patient: 30 (11/07/17 1100)

## 2017-11-09 ENCOUNTER — Telehealth: Payer: Self-pay

## 2017-11-09 LAB — SURGICAL PATHOLOGY

## 2017-11-09 NOTE — Telephone Encounter (Signed)
Elizabeth Davenport returned call through Ronnald Collum, interpreter services. She stated she went to [facility] ED because the pain medication she was given at [facility] ED was not helping her pain and she did not know what else to do. Informed her that [facility] is requesting the biopsy for evaluation and asked if she was planning on transferring care. She stated she does not want her care transferred to [facility] and wants to keep all appointments here. Appointments for 5/20 and 5/22 reviewed. She has no further questions. At this time she states her pain is under control. Oncology Nurse Navigator Documentation  Navigator Location: [facility] (11/09/17 1417)   )Navigator Encounter Type: Telephone (11/09/17 1417) Telephone: Incoming Call;Patient Update (11/09/17 1417)                                                  Time Spent with Patient: 15 (11/09/17 1417)

## 2017-11-09 NOTE — Telephone Encounter (Signed)
Voicemail left with daughter, Oley Balm, and with Ms. Sharen Counter, using interpreter services. Asked to return call regarding upcoming appointments. Oncology Nurse Navigator Documentation  Navigator Location: [facility] (11/09/17 1400)   )Navigator Encounter Type: Telephone (11/09/17 1400) Telephone: Elizabeth Davenport Call;Appt Confirmation/Clarification (11/09/17 1400)                                                  Time Spent with Patient: 15 (11/09/17 1400)

## 2017-11-11 NOTE — Progress Notes (Signed)
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  She recently presented to the ER with abdominal pain and found to have bulky disease throughout her abdomen.  She otherwise feels well.  She has no neurologic complaints.  She denies any recent fevers or illnesses.  She has a fair appetite, but denies weight loss.  She has no chest pain or shortness of breath.  She denies any nausea, vomiting, constipation, or diarrhea.  She has no urinary complaints.  Patient offers no further specific complaints today.  REVIEW OF SYSTEMS:   Review of Systems  Constitutional: Negative.  Negative for fever, malaise/fatigue and weight loss.  Respiratory: Negative.  Negative for cough and shortness of breath.   Cardiovascular: Negative.  Negative for chest pain and leg swelling.  Gastrointestinal: Negative.  Negative for abdominal pain, constipation, diarrhea, nausea and vomiting.  Genitourinary: Negative.  Negative for dysuria.  Musculoskeletal: Negative.  Negative for back pain.  Skin: Negative.  Negative for rash.  Neurological: Negative.  Negative for sensory change, focal weakness and weakness.  Psychiatric/Behavioral: Negative.  The patient is not nervous/anxious.     As per HPI. Otherwise, a complete review of systems is negative.  PAST MEDICAL HISTORY: Past Medical History:  Diagnosis Date  . Arthritis    left knee  . Asthma   . Endometrial cancer ([place])  11/04/14  . PONV (postoperative nausea and vomiting)     PAST SURGICAL HISTORY: Past Surgical History:  Procedure Laterality Date  . ABDOMINAL HYSTERECTOMY    . LAPAROSCOPIC BILATERAL SALPINGO OOPHERECTOMY Bilateral 11/04/2014   Procedure: LAPAROSCOPIC BILATERAL SALPINGO OOPHORECTOMY;  Surgeon: Mellody Drown, MD;  Location: [facility] ORS;  Service: Gynecology;  Laterality: Bilateral;  . LAPAROSCOPIC HYSTERECTOMY N/A 11/04/2014   Procedure: HYSTERECTOMY TOTAL LAPAROSCOPIC;  Surgeon: Mellody Drown, MD;  Location: [facility] ORS;  Service: Gynecology;  Laterality: N/A;  . removed mass from thyriod      FAMILY HISTORY: Family History  Problem Relation Age of Onset  . Breast cancer Sister 67  . Asthma Mother   . Coronary artery disease Mother   . Diabetes Brother   . Diabetes Sister   . Diabetes Sister   . Diabetes Brother     ADVANCED DIRECTIVES (Y/N):  N  HEALTH MAINTENANCE: Social History   Tobacco Use  . Smoking status: Former Smoker    Packs/day: 0.25    Years: 8.00    Pack years: 2.00    Types: Cigarettes    Last attempt to quit: 06/29/1990    Years since quitting: 27.3  . Smokeless tobacco: Never Used  . Tobacco comment: Smoking History Cigarettes 1 cig per day; quit 22 years ago; smoked for 8 years  Substance Use Topics  . Alcohol use: No    Alcohol/week: 0.0 oz    Comment: occasional   . Drug use: No     Colonoscopy:  PAP:  Bone density:  Lipid panel:  No Known Allergies  Current Outpatient Medications  Medication Sig Dispense Refill  . HYDROmorphone (DILAUDID)  2 MG tablet Take by mouth.    Marland Kitchen albuterol (PROVENTIL HFA;VENTOLIN HFA) 108 (90 BASE) MCG/ACT inhaler Inhale 2 puffs into the lungs every 6 (six) hours as needed for wheezing or shortness of breath.    . ondansetron (ZOFRAN ODT) 4 MG disintegrating tablet Take 1 tablet (4 mg total) by mouth every 8 (eight) hours as needed for nausea or vomiting. (Patient not taking: Reported on 11/12/2017) 20 tablet 0  .  oxyCODONE-acetaminophen (PERCOCET) 5-325 MG tablet Take 1 tablet by mouth every 4 (four) hours as needed for severe pain. (Patient not taking: Reported on 11/12/2017) 20 tablet 0   No current facility-administered medications for this visit.     OBJECTIVE: Vitals:   11/12/17 1341  BP: (!) 141/88  Pulse: 69  Resp: 18  Temp: 97.9 F (36.6 C)     Body mass index is 37.84 kg/m.    ECOG FS:0 - Asymptomatic  General: Well-developed, well-nourished, no acute distress. Eyes: Pink conjunctiva, anicteric sclera. HEENT: Normocephalic, moist mucous membranes, clear oropharnyx. Lungs: Clear to auscultation bilaterally. Heart: Regular rate and rhythm. No rubs, murmurs, or gallops. Abdomen: Soft, nontender, nondistended. No organomegaly noted, normoactive bowel sounds. Musculoskeletal: No edema, cyanosis, or clubbing. Neuro: Alert, answering all questions appropriately. Cranial nerves grossly intact. Skin: No rashes or petechiae noted. Psych: Normal affect. Lymphatics: No cervical, calvicular, axillary or inguinal LAD.   LAB RESULTS:  Lab Results  Component Value Date   NA 139 10/31/2017   K 3.8 10/31/2017   CL 106 10/31/2017   CO2 25 10/31/2017   GLUCOSE 112 (H) 10/31/2017   BUN 12 10/31/2017   CREATININE 0.41 (L) 10/31/2017   CALCIUM 8.3 (L) 10/31/2017   PROT 7.3 10/31/2017   ALBUMIN 3.4 (L) 10/31/2017   AST 22 10/31/2017   ALT 22 10/31/2017   ALKPHOS 52 10/31/2017   BILITOT 0.6 10/31/2017   GFRNONAA >60 10/31/2017   GFRAA >60 10/31/2017    Lab Results  Component Value Date   WBC 5.9 11/07/2017   NEUTROABS 4.1 11/07/2017   HGB 12.2 11/07/2017   HCT 36.0 11/07/2017   MCV 84.8 11/07/2017   PLT 357 11/07/2017     STUDIES: Ct Abdomen Pelvis W Contrast  Result Date: 10/31/2017 CLINICAL DATA:  62 year old female with abdominal pain. Concern for acute diverticulitis. History of endometrial carcinoma. EXAM: CT ABDOMEN AND PELVIS WITH CONTRAST TECHNIQUE: Multidetector CT  imaging of the abdomen and pelvis was performed using the standard protocol following bolus administration of intravenous contrast. CONTRAST:  86mL ISOVUE-370 IOPAMIDOL (ISOVUE-370) INJECTION 76% COMPARISON:  Abdominal CT dated 09/22/2014 FINDINGS: Lower chest: The visualized lung bases are clear. Multiple mildly rounded lymph nodes in the right cardiophrenic angle are new compared to prior CT. These measure up to 7 mm in diameter (series 2, image 14). A cluster of smaller nodular densities noted at the left cardiophrenic angle (series 2, image 20) also new. No intra-abdominal free air. There is a small upper abdominal and pelvic free fluid, new compared to the prior CT. Hepatobiliary: Scattered subcentimeter hepatic hypodense lesions are too small to characterize but appears similar to prior CT, likely cysts. No intrahepatic biliary ductal dilatation. The gallbladder is unremarkable. Pancreas: Unremarkable. No pancreatic ductal dilatation or surrounding inflammatory changes. Spleen: Normal in size without focal abnormality. Adrenals/Urinary Tract: The adrenal glands are unremarkable. There is a 4.5 cm right renal cyst. Smaller right renal hypodense lesion is too small to characterize. The left kidney is unremarkable. There is symmetric enhancement and excretion of contrast  by both kidneys. The visualized ureters and urinary bladder appear unremarkable. Stomach/Bowel: There are scattered sigmoid diverticula without active inflammatory changes. There is a small hiatal hernia. There is no bowel obstruction or active inflammation. Normal appendix. Vascular/Lymphatic: There is mild aortoiliac atherosclerotic disease. No portal venous gas. Retroaortic left renal vein anatomy. No adenopathy. Reproductive: Hysterectomy. Other: There is extensive omental nodularity and caking, new compared to the prior CT consistent with omental implants. Multiple small nodularities in the left upper quadrant under the left hemidiaphragm  (series 2, image 23 consistent with peritoneal implants. Musculoskeletal: No acute or significant osseous findings. IMPRESSION: 1. Interval development of omental and peritoneal implants/metastatic disease and small amount of ascites. 2. Mildly rounded nodular densities/lymph nodes at the cardiophrenic angles, new compared to prior CT and concerning for metastatic disease. 3. Sigmoid diverticulosis. No bowel obstruction or active inflammation. Normal appendix. Electronically Signed   By: Anner Crete M.D.   On: 10/31/2017 06:36   Ct Biopsy  Result Date: 11/07/2017 INDICATION: 62 year old with history of endometrial cancer and low-grade serous ovarian cancer. Patient recently had left lower abdominal pain and CT imaging demonstrated new omental thickening and disease. Plan for omental biopsy. EXAM: CT-GUIDED OMENTUM BIOPSY MEDICATIONS: None. ANESTHESIA/SEDATION: Fentanyl 100 mcg The patient's level of consciousness and vital signs were monitored continuously by radiology nursing throughout the procedure under my direct supervision. FLUOROSCOPY TIME:  None COMPLICATIONS: None immediate. PROCEDURE: Informed written consent was obtained from the patient after a thorough discussion of the procedural risks, benefits and alternatives. All questions were addressed. A timeout was performed prior to the initiation of the procedure. Patient was given oral contrast prior to the procedure in order to opacify the bowel structures adjacent to the omental thickening. CT images of the abdomen were obtained. The left side of the abdomen was prepped and draped in sterile fashion. Skin and soft tissues were anesthetized with 1% lidocaine. A 17 gauge coaxial needle was directed into the omental thickening with CT guidance. The needle was directed anterior to the small bowel loops. A total of 4 core biopsies were obtained with an 18 gauge core device. Specimens were placed in formalin. 17 gauge needle was removed without  complication. Bandage placed over the puncture site. FINDINGS: Prominent omental thickening in the anterior abdomen. Omental thickening measures roughly 2.5 cm. 17 gauge coaxial needle was directed from a lateral to medial approach. Needle position was confirmed within the omental thickening. Adequate core biopsies were obtained. No significant bleeding or hematoma formation following the core biopsies. IMPRESSION: Successful CT-guided core biopsies of the omental thickening. Electronically Signed   By: Markus Daft M.D.   On: 11/07/2017 12:52    ASSESSMENT: Ovarian malignancy.  PLAN:   1.  Ovarian malignancy: Case discussed with gynecology oncology.  CT results reviewed independently and reported as above.  Ca-125 is within normal limits at 15.4.  Although metastatic, this is a low-grade serous malignancy which may not be responsive to aggressive chemotherapy.  Plan is to do optimal debulking surgery which will be completed at [facility] in the next several weeks.  Patient will then follow-up 2 to 3 weeks postoperatively to discuss her final pathology results.  Will discuss the possibility of hormonal treatment rather than aggressive chemotherapy at that time. 2.  Endometrial cancer: Patient had total hysterectomy.  Imaging as above.  The entire visit was done in the presence of interpreter.  Approximately 60 minutes was spent in discussion of which greater than 50% was consultation.   Patient  expressed understanding and was in agreement with this plan. She also understands that She can call clinic at any time with any questions, concerns, or complaints.   Cancer Staging Endometrial cancer ([place]) Staging form: Corpus Uteri - Carcinoma, AJCC 7th Edition - Clinical: Stage I (T1, N0, M0) - Unsigned  Ovarian cancer, bilateral ([place]) Staging form: Ovary, AJCC 7th Edition - Clinical: Stage Unknown (T1c, NX, M0) - Signed by Forest Gleason, MD on 11/27/2014   Elizabeth Huger, MD   11/14/2017  12:57 PM

## 2017-11-12 ENCOUNTER — Encounter: Payer: Self-pay | Admitting: Oncology

## 2017-11-12 ENCOUNTER — Other Ambulatory Visit: Payer: Self-pay

## 2017-11-12 ENCOUNTER — Inpatient Hospital Stay: Payer: Self-pay | Attending: Oncology | Admitting: Oncology

## 2017-11-12 VITALS — BP 141/88 | HR 69 | Temp 97.9°F | Resp 18 | Ht 61.42 in | Wt 203.0 lb

## 2017-11-12 DIAGNOSIS — R1900 Intra-abdominal and pelvic swelling, mass and lump, unspecified site: Secondary | ICD-10-CM | POA: Insufficient documentation

## 2017-11-12 DIAGNOSIS — Z8542 Personal history of malignant neoplasm of other parts of uterus: Secondary | ICD-10-CM

## 2017-11-12 DIAGNOSIS — Z803 Family history of malignant neoplasm of breast: Secondary | ICD-10-CM | POA: Insufficient documentation

## 2017-11-12 DIAGNOSIS — Z87891 Personal history of nicotine dependence: Secondary | ICD-10-CM | POA: Insufficient documentation

## 2017-11-12 DIAGNOSIS — Z79899 Other long term (current) drug therapy: Secondary | ICD-10-CM | POA: Insufficient documentation

## 2017-11-12 DIAGNOSIS — K5900 Constipation, unspecified: Secondary | ICD-10-CM | POA: Insufficient documentation

## 2017-11-12 DIAGNOSIS — K5730 Diverticulosis of large intestine without perforation or abscess without bleeding: Secondary | ICD-10-CM | POA: Insufficient documentation

## 2017-11-12 DIAGNOSIS — Z90710 Acquired absence of both cervix and uterus: Secondary | ICD-10-CM | POA: Insufficient documentation

## 2017-11-12 DIAGNOSIS — D3910 Neoplasm of uncertain behavior of unspecified ovary: Secondary | ICD-10-CM

## 2017-11-12 DIAGNOSIS — C562 Malignant neoplasm of left ovary: Secondary | ICD-10-CM | POA: Insufficient documentation

## 2017-11-12 DIAGNOSIS — Z90722 Acquired absence of ovaries, bilateral: Secondary | ICD-10-CM | POA: Insufficient documentation

## 2017-11-12 DIAGNOSIS — E669 Obesity, unspecified: Secondary | ICD-10-CM | POA: Insufficient documentation

## 2017-11-12 DIAGNOSIS — C541 Malignant neoplasm of endometrium: Secondary | ICD-10-CM | POA: Insufficient documentation

## 2017-11-12 DIAGNOSIS — C561 Malignant neoplasm of right ovary: Secondary | ICD-10-CM | POA: Insufficient documentation

## 2017-11-12 NOTE — Progress Notes (Signed)
Here for new pt evaluation . Here w Scarlette Slice and daughter.

## 2017-11-14 ENCOUNTER — Inpatient Hospital Stay (HOSPITAL_BASED_OUTPATIENT_CLINIC_OR_DEPARTMENT_OTHER): Payer: Self-pay | Admitting: Obstetrics and Gynecology

## 2017-11-14 ENCOUNTER — Inpatient Hospital Stay: Payer: Self-pay

## 2017-11-14 VITALS — BP 112/76 | HR 76 | Temp 97.5°F | Ht 61.42 in

## 2017-11-14 DIAGNOSIS — C569 Malignant neoplasm of unspecified ovary: Secondary | ICD-10-CM

## 2017-11-14 DIAGNOSIS — Z90710 Acquired absence of both cervix and uterus: Secondary | ICD-10-CM

## 2017-11-14 DIAGNOSIS — D3910 Neoplasm of uncertain behavior of unspecified ovary: Secondary | ICD-10-CM

## 2017-11-14 DIAGNOSIS — C786 Secondary malignant neoplasm of retroperitoneum and peritoneum: Secondary | ICD-10-CM

## 2017-11-14 DIAGNOSIS — Z90722 Acquired absence of ovaries, bilateral: Secondary | ICD-10-CM

## 2017-11-14 NOTE — Progress Notes (Signed)
Patient c/o fatigue, feeling tired, abdominal pain/ distention, constipation and problems with her bladder

## 2017-11-14 NOTE — Progress Notes (Signed)
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  Subjective:   Ms Armetta is a pleasant patient who presents for recurrent bilateral ovarian borderline tumor excrescences.   At her last visit on 05/23/17, she was seen by Dr. Theora Gianotti for stage Ia grade 1 endometrioid endometrial cancer and concomitant small bilateral ovarian borderline serous tumor excrescences with positive washings s/p TLH/BSO on 11/04/14. She had a negative exam that day. Her CA 125 was 15.4 on 11/08/16 and went to 30.6 on 05/23/17 but was  Within normal limits and she was asymptomatic. CA 125 was repeated on 07/09/17 and had fallen to 25.2.   On 10/31/17 she presented to [facility] ED complaining of left lower quadrant abdominal pain.  10/31/17- CT Abdomen Pelvis W Contrast- 1. Interval development of omental and peritoneal implants/metastatic disease and small amount of ascites. 2. Mildly rounded nodular densities/lymph nodes at the cardiophrenic angles, new compared to prior CT and concerning for metastatic disease. 3. Sigmoid diverticulosis. No bowel obstruction or active inflammation. Normal appendix.  11/07/17- omentum, left anterior abdomen; ct-guided biopsy:  PATHOLOGY: - POSITIVE FOR INVOLVEMENT BY EPITHELIAL NEOPLASM OF GYNECOLOGIC ORIGIN,  SEE COMMENT. Comment:  The sample consists of fibroadipose tissue containing a few tiny groups  of tumor cells in a background of marked inflammation and fibrosis. The  tumor cells are highlighted by PAX8 immunohistochemistry, demonstrating moderate nuclear staining in rare clusters, with the largest group containing only about 60 cells. The tumor cells are strongly positive on pancytokeratin stain (AE1/AE3/Cam5.2), and they are located in areas with cytokeratin-positive cells consistent with entrapped mesothelial cells. This may indicate that the neoplastic cells are close to the peritoneal surface. Scattered small  calcifications are also present, without epithelium, which may be psammoma bodies. The slides from the previous TAH-BSO were reviewed ([id number]). The omental process represents involvement very probably from the ovarian serous neoplasm. It is not possible to classify the involvement as implant or carcinoma due to the small amount of tumor cells. The volume of tumor cells is too low for ancillary testing.   She was seen by Dr. Grayland Ormond with Medical-Oncology on 11/12/17 for discussion of biopsy results. She was referred back to gyn-onc and presents to clinic today, reported fatigue, abdominal pain, bloating with distension, and constipation. She is spanish speaking and an interpreter was used for all patient interactions. She is also accompanied by her husband. CA 125 is pending.   She coordinates mammograms and breast exams through [facility] clinic and is due this month.   Oncology history: Shirlean Berman is a 62 y.o. G2P2 woman who developed light vaginal bleeding in early 2016 after undergoing LMP/menopause in 2014. Dr DeFrancesco did PAP that was normal.  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  CT scan 09/22/14 abd/pelvis negative for metastatic disease.  She was referred to [place] and on exam the cervix was normal.  Slides were reviewed at [facility] and read as probable endometrial primary.  She underwent TLH[facility]BSO Nov 04, 2014 at [facility].  Right ovarian excresence noted at surgery.  Survey of the rest of the abdomen, including the omentum and right diaphragm was negative.  Further staging not performed due to patient's refusal to accept blood products as she is a Jehovah's witness.  Path at [facility] showed 2.6 cm grade 1 endometrial cancer without LVSI or cervical involvement.  The ovaries both were found to have low grade serous cancers of 7 and 8  mm.  No spread noted, but washings were positive.   In view of the path report suggesting primary  endometrial and uterine cancers I has the pathology reviewed at [facility].  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  However, the ovarian excresences that were read as low grade serous carcinoma were interpreted as serous borderline tumors by the [facility] Pathologists (several of them reviewed the case). The recommendation was for surveillance.   CA125 values 8.1 - 05/05/15 12.7 - 11/03/15 16.2 - 05/10/16 15.4 - 11/08/2016 30.6 - 05/23/17 25.2 - 07/09/2017  Problem List: Patient Active Problem List   Diagnosis Date Noted  . 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 History of endometrial cancer 11/08/2016  . Serous cystadenoma with borderline malignant features ([place]) 11/08/2016  . 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 Ovarian cancer, bilateral ([place]) 11/25/2014  . Endometrial cancer ([place]) 11/04/2014  . S/P laparoscopic hysterectomy 11/04/2014    Past Medical History: Past Medical History:  Diagnosis Date  . Arthritis    left knee  . Asthma   . Endometrial cancer ([place]) 11/04/14  . PONV (postoperative nausea and vomiting)     Past Surgical History: Past Surgical History:  Procedure Laterality Date  . ABDOMINAL HYSTERECTOMY    . LAPAROSCOPIC BILATERAL SALPINGO OOPHERECTOMY Bilateral 11/04/2014   Procedure: LAPAROSCOPIC BILATERAL SALPINGO OOPHORECTOMY;  Surgeon: Mellody Drown, MD;  Location: [facility] ORS;  Service: Gynecology;  Laterality: Bilateral;  . LAPAROSCOPIC HYSTERECTOMY N/A 11/04/2014   Procedure: HYSTERECTOMY TOTAL LAPAROSCOPIC;  Surgeon: Mellody Drown, MD;  Location: [facility] ORS;  Service: Gynecology;  Laterality: N/A;  . removed mass from thyriod      Family History: Family History  Problem Relation Age of Onset  . Breast cancer Sister 72  . Asthma Mother   . Coronary artery disease Mother   . Diabetes Brother   . Diabetes Sister   . Diabetes Sister   . Diabetes Brother      Social History: Social History   Socioeconomic History  . Marital status: Married    Spouse name: Not on file  . Number of children: Not on file  . Years of education: Not on file  . Highest education level: Not on file  Occupational History  . Not on file  Social Needs  . Financial resource strain: Not on file  . Food insecurity:    Worry: Not on file    Inability: Not on file  . Transportation needs:    Medical: Not on file    Non-medical: Not on file  Tobacco Use  . Smoking status: Former Smoker    Packs/day: 0.25    Years: 8.00    Pack years: 2.00    Types: Cigarettes    Last attempt to quit: 06/29/1990    Years since quitting: 27.3  . Smokeless tobacco: Never Used  . Tobacco comment: Smoking History Cigarettes 1 cig per day; quit 22 years ago; smoked for 8 years  Substance and Sexual Activity  . Alcohol use: No    Alcohol/week: 0.0 oz    Comment: occasional   . Drug use: No  . Sexual activity: Yes    Partners: Male  Lifestyle  . Physical activity:    Days per week: Not on file    Minutes per session: Not on file  . Stress: Not on file  Relationships  . Social connections:    Talks on phone: Not on file  Gets together: Not on file    Attends religious service: Not on file    Active member of club or organization: Not on file    Attends meetings of clubs or organizations: Not on file    Relationship status: Not on file  . Intimate partner violence:    Fear of current or ex partner: Not on file    Emotionally abused: Not on file    Physically abused: Not on file    Forced sexual activity: Not on file  Other Topics Concern  . Not on file  Social History Narrative  . Not on file    Allergies: No Known Allergies  Current Medications: Current Outpatient Medications  Medication Sig Dispense Refill  . HYDROmorphone (DILAUDID) 2 MG tablet Take by mouth.    Marland Kitchen albuterol (PROVENTIL HFA;VENTOLIN HFA) 108 (90 BASE) MCG/ACT inhaler Inhale 2 puffs into the  lungs every 6 (six) hours as needed for wheezing or shortness of breath.    . ondansetron (ZOFRAN ODT) 4 MG disintegrating tablet Take 1 tablet (4 mg total) by mouth every 8 (eight) hours as needed for nausea or vomiting. (Patient not taking: Reported on 11/12/2017) 20 tablet 0  . oxyCODONE-acetaminophen (PERCOCET) 5-325 MG tablet Take 1 tablet by mouth every 4 (four) hours as needed for severe pain. (Patient not taking: Reported on 11/12/2017) 20 tablet 0   No current facility-administered medications for this visit.     Review of Systems General: fatigue  HEENT: no complaints  Lungs: no complaints  Cardiac: no complaints  GI: abdominal pain bloating and distension. constipation  GU: no complaints  Musculoskeletal: no complaints  Extremities: no complaints  Skin: no complaints  Neuro: no complaints  Endocrine: no complaints  Psych: no complaints      Objective:  Physical Examination:  BP 112/76 (BP Location: Right Arm, Patient Position: Sitting)   Pulse 76   Temp (!) 97.5 F (36.4 C) (Tympanic)   Ht 5' 1.42" (1.56 m)   LMP 11/04/2011   SpO2 98%   BMI 37.83 kg/m   ECOG Performance Status: 1- symptomatic but ambulatory  GENERAL: Patient is a well appearing female in no acute distress HEENT:  Sclerae anicteric.  Oropharynx clear and moist. No ulcerations or evidence of oropharyngeal candidiasis. Neck is supple.  NODES:  No cervical, supraclavicular, or axillary lymphadenopathy palpated.  LUNGS:  Clear to auscultation bilaterally.  No wheezes or rhonchi. HEART:  Regular rate and rhythm. No murmur appreciated. ABDOMEN:  Soft, nontender.  Positive, normoactive bowel sounds. No organomegaly palpated. MSK:  No focal spinal tenderness to palpation. Full range of motion bilaterally in the upper extremities. EXTREMITIES:  No peripheral edema.   SKIN:  Clear with no obvious rashes or skin changes. No nail dyscrasia. NEURO:  Nonfocal. Well oriented.  Appropriate affect. BREAST:  Breasts appear normal, no suspicious masses, no skin or nipple changes or axillary nodes  Pelvic: Exam Chaperoned by RN EGBUS/Vaginal Cuff: cuff well healed.  Rectovaginal: confirmatory   Assessment:  Yoana Staib is a 62 y.o. 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 Now with omental mass and biopsy positive for recurrent serous neoplasm and she has other peritoneal findings consistent with disease.     Medical cormorbities: Obesity.   Jehovah's witness.  Plan:   Problem List Items Addressed This Visit      Other   Serous cystadenoma with borderline malignant  features ([place]) - Primary      We discussed the current situation and recommendations with the patient and family today using an interpreter.  She probably has recurrence of borderline serous ovarian neoplasm,most likely borderline, but could be low grade invasive.  Do not think this is recurrence of grade 1 endometrial cancer.  These low grade/borderline serous tumors are not typically chemoresponsive, and surgical excision is the most beneficial approach if feasible.  This is often followed by hormonal therapy, as chemotherapy is not very active.  New biological targeted therapies against the RAS/RAF/ MAP kinase pathway are also an option on clinical trials.    Patient is agreeable to surgery, but confirms that she does not accept blood transfusions.  In view of this we will have her seen at the [facility] center for blood conservation.  And I will see her in clinic at [facility] to schedule her surgery there.  We would start with laparoscopy to assess the feasibility of debulking and hope to proceed with an open procedure.  If not possible to achieve effective debulking, we could do additional biopsies and consider a trial of systemic therapy prior to attempting debulking at a later date if she  responded well.    I personally reviewed pertinent history, ROS, and examined the patient in conjunction with Beckey Rutter, NP.    Verlon Au, NP  CC:  Dr. Rubie Maid

## 2017-11-14 NOTE — Progress Notes (Signed)
Referral sent to [facility] to see Dr. Fransisca Connors and center for blood conservation. They will contact her regarding appointments.  Oncology Nurse Navigator Documentation  Navigator Location: [facility] (11/14/17 1300)   )Navigator Encounter Type: Follow-up Appt (11/14/17 1300)                     Patient Visit Type: GynOnc (11/14/17 1300)                              Time Spent with Patient: 30 (11/14/17 1300)

## 2017-11-15 LAB — CA 125: Cancer Antigen (CA) 125: 204.4 U/mL — ABNORMAL HIGH (ref 0.0–38.1)

## 2017-11-21 ENCOUNTER — Other Ambulatory Visit: Payer: Self-pay

## 2017-11-21 ENCOUNTER — Ambulatory Visit: Payer: Self-pay

## 2017-11-28 ENCOUNTER — Other Ambulatory Visit: Payer: Self-pay | Admitting: Obstetrics and Gynecology

## 2017-11-28 NOTE — Progress Notes (Signed)
I contacted Dr. Dicie Beam regarding the pathology. She is unable to distinguish between borderline vs EOC. Will alert Dr. Fransisca Connors.  Gillis Ends, MD     SURGICAL PATHOLOGY Surgical Pathology  CASE: [phone]  PATIENT: Elizabeth Davenport  Surgical Pathology Report      SPECIMEN SUBMITTED:  A. Omentum mass, left anterior abdomen; CT biopsy   CLINICAL HISTORY:  History of endometrial cancer and serous ovarian neoplasm in 2016,  TAH-BSO, no adjuvant treatment. Now with abdominal pain and new omental  thickening. CA125 is 25.   PRE-OPERATIVE DIAGNOSIS:  Omental/peritoneal metastatic disease   POST-OPERATIVE DIAGNOSIS:  None provided.      DIAGNOSIS:  A. 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   Comment:  The sample consists of fibroadipose tissue containing a few tiny groups  of tumor cells in a background of marked inflammation and fibrosis. The  tumor cells are highlighted by PAX8 immunohistochemistry, demonstrating  moderate nuclear staining in rare clusters, with the largest group  containing only about 60 cells. The tumor cells are strongly positive on  pancytokeratin stain (AE1/AE3/Cam5.2), and they are located in areas  with cytokeratin-positive cells consistent with entrapped mesothelial  cells. This may indicate that the neoplastic cells are close to the  peritoneal surface. Scattered small calcifications are also present,  without epithelium, which may be psammoma bodies.   Due to the intense and focally granulomatous-appearing inflammation,  stains for fungi (GMS) and acid-fast organisms (AFB) were examined and  are negative for infectious agents, with appropriately stained controls.   The slides from the previous TAH-BSO were reviewed ([id number]). The  omental process represents involvement very probably from the ovarian  serous neoplasm. It is not  possible to classify the involvement as  implant or carcinoma due to the small amount of tumor cells. The volume  of tumor cells is too low for ancillary testing.   IHC slides were prepared by [facility]  Pathology, [place], [place]. All controls stained appropriately.   This test was developed and its performance characteristics determined  by [facility]. It has not been cleared or approved by the [place] Food and Drug  Administration. The FDA does not require this test to go through  premarket FDA review. This test is used for clinical purposes. It should  not be regarded as investigational or for research. This laboratory is  certified under the Clinical Laboratory Improvement Amendments (CLIA) as  qualified to perform high complexity clinical laboratory testing.

## 2017-11-30 DIAGNOSIS — Z531 Procedure and treatment not carried out because of patient's decision for reasons of belief and group pressure: Secondary | ICD-10-CM | POA: Insufficient documentation

## 2017-12-03 ENCOUNTER — Telehealth: Payer: Self-pay

## 2017-12-03 ENCOUNTER — Other Ambulatory Visit: Payer: Self-pay

## 2017-12-03 NOTE — Telephone Encounter (Signed)
error 

## 2017-12-24 ENCOUNTER — Ambulatory Visit
Admission: RE | Admit: 2017-12-24 | Discharge: 2017-12-24 | Disposition: A | Discharge status: Home / Self Care | Payer: Self-pay | Source: Ambulatory Visit | Attending: Oncology | Admitting: Oncology

## 2017-12-24 ENCOUNTER — Encounter: Payer: Self-pay | Admitting: *Deleted

## 2017-12-24 ENCOUNTER — Ambulatory Visit: Payer: Self-pay | Attending: Oncology | Admitting: *Deleted

## 2017-12-24 VITALS — BP 110/76 | HR 71 | Temp 98.1°F | Ht 60.0 in | Wt 198.0 lb

## 2017-12-24 DIAGNOSIS — Z0000 Encounter for general adult medical examination without abnormal findings: Secondary | ICD-10-CM | POA: Insufficient documentation

## 2017-12-24 NOTE — Progress Notes (Signed)
  Subjective:     Patient ID: Elizabeth Davenport, female   DOB: Jun 07, 1956, 62 y.o.   MRN: [id number]  HPI   Review of Systems     Objective:   Physical Exam  Pulmonary/Chest: Right breast exhibits no inverted nipple, no mass, no nipple discharge, no skin change and no tenderness. Left breast exhibits no inverted nipple, no mass, no nipple discharge, no skin change and no tenderness.  Abdominal:         Assessment:     62 year old Hispanic female returns to [facility] for annual screening.  Lloyda, the interpreter present during the interview and exam.  Clinical breast exam unremarkable.  Taught self breast awareness.  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  She recently underwent abdominal surgery for metastatic omental caking.  States she has a follow-up appointment with Dr. Fransisca Connors at [facility] on 01/04/18.  Patient has been screened for eligibility.  She does not have any insurance, Medicare or Medicaid.  She also meets financial eligibility.  Hand-out given on the Affordable Care Act.     Plan:     Screening mammogram ordered.  Will follow-up per [facility] protocol.

## 2017-12-24 NOTE — Patient Instructions (Signed)
Gave patient hand-out, Women Staying Healthy, Active and Well from [facility], with education on breast health, pap smears, heart and colon health. 

## 2017-12-24 NOTE — Progress Notes (Signed)
Letter mailed from [facility] to inform patient of her normal mammogram results.  Patient is to follow-up with annual screening in one year.  HSIS to Christy. 

## 2018-01-03 ENCOUNTER — Telehealth: Payer: Self-pay

## 2018-01-03 NOTE — Telephone Encounter (Signed)
Daughter, Oley Balm, returned call and voiced understanding for appointment with Dr. Grayland Ormond. Instructed she will be notified once it is arranged.

## 2018-01-03 NOTE — Telephone Encounter (Signed)
Voicemail left with daughter, Elizabeth Davenport to return call for appointment scheduling. Appointment needs to be arranged with Dr. Grayland Ormond for adjuvant therapy post surgery. She has post op with Dr. Fransisca Connors at [facility] 7/12. Surgery with biopsy was 6/20. Message sent to scheduling to arrange. Oncology Nurse Navigator Documentation  Navigator Location: [facility] (01/03/18 1500)   )Navigator Encounter Type: Telephone;Follow-up Appt (01/03/18 1500)                                                    Time Spent with Patient: 15 (01/03/18 1500)

## 2018-01-06 NOTE — Progress Notes (Signed)
[facility]  [phone] [phone]  ID: Cianna Kasparian OB: [birth year]  MR#: [id number]  SEG#:[id number]  Patient Care Team: Letta Median, MD as PCP - General (Family Medicine) Clent Jacks, RN as Registered Nurse Rico Junker, RN as Registered Nurse Theodore Demark, RN as Registered Nurse  CHIEF COMPLAINT: Progressive low-grade ovarian malignancy  INTERVAL HISTORY: Patient was only evaluated one time in May 2019.  She is referred back for consideration of adjuvant chemotherapy with carboplatinum and Taxol after being found to have progressive disease.  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  She is anxious, but otherwise feels well. She has no neurologic complaints.  She denies any recent fevers or illnesses.  She has a fair appetite, but denies weight loss.  She has no chest pain or shortness of breath.  She denies any nausea, vomiting, constipation, or diarrhea.  She has no urinary complaints.  Patient offers no specific complaints today.  REVIEW OF SYSTEMS:   Review of Systems  Constitutional: Negative.  Negative for fever, malaise/fatigue and weight loss.  Respiratory: Negative.  Negative for cough and shortness of breath.   Cardiovascular: Negative.  Negative for chest pain and leg swelling.  Gastrointestinal: Negative.  Negative for abdominal pain, constipation, diarrhea, nausea and vomiting.  Genitourinary: Negative.  Negative for dysuria.  Musculoskeletal: Negative.  Negative for back pain.  Skin: Negative.  Negative for rash.  Neurological: Negative.  Negative for sensory change, focal weakness and weakness.  Psychiatric/Behavioral: The patient is nervous/anxious.     As per HPI. Otherwise, a complete review of systems is negative.  PAST MEDICAL HISTORY: Past Medical History:   Diagnosis Date  . Arthritis    left knee  . Asthma   . Endometrial cancer ([place]) 11/04/14  . PONV (postoperative nausea and vomiting)     PAST SURGICAL HISTORY: Past Surgical History:  Procedure Laterality Date  . ABDOMINAL HYSTERECTOMY    . LAPAROSCOPIC BILATERAL SALPINGO OOPHERECTOMY Bilateral 11/04/2014   Procedure: LAPAROSCOPIC BILATERAL SALPINGO OOPHORECTOMY;  Surgeon: Mellody Drown, MD;  Location: [facility] ORS;  Service: Gynecology;  Laterality: Bilateral;  . LAPAROSCOPIC HYSTERECTOMY N/A 11/04/2014   Procedure: HYSTERECTOMY TOTAL LAPAROSCOPIC;  Surgeon: Mellody Drown, MD;  Location: [facility] ORS;  Service: Gynecology;  Laterality: N/A;  . removed mass from thyriod      FAMILY HISTORY: Family History  Problem Relation Age of Onset  . Breast cancer Sister 82  . Asthma Mother   . Coronary artery disease Mother   . Diabetes Brother   . Diabetes Sister   . Diabetes Sister   . Diabetes Brother     ADVANCED DIRECTIVES (Y/N):  N  HEALTH MAINTENANCE: Social History   Tobacco Use  . Smoking status: Former Smoker    Packs/day: 0.25    Years: 8.00    Pack years: 2.00    Types: Cigarettes    Last attempt to quit: 06/29/1990    Years since quitting: 27.5  . Smokeless tobacco: Never Used  . Tobacco comment: Smoking History Cigarettes 1 cig per day; quit 22 years ago; smoked for 8 years  Substance Use Topics  . Alcohol use: No    Alcohol/week: 0.0 oz    Comment: occasional   . Drug use: No     Colonoscopy:  PAP:  Bone density:  Lipid panel:  No Known Allergies  Current Outpatient Medications  Medication Sig Dispense Refill  . albuterol (PROVENTIL HFA;VENTOLIN HFA) 108 (90 BASE) MCG/ACT inhaler Inhale 2 puffs into the lungs every 6 (six) hours as needed for wheezing or shortness of breath.    Marland Kitchen HYDROmorphone (DILAUDID) 2 MG tablet Take by mouth.    . ondansetron (ZOFRAN ODT) 4 MG disintegrating tablet Take 1 tablet (4 mg total) by mouth every 8 (eight) hours as needed  for nausea or vomiting. (Patient not taking: Reported on 11/12/2017) 20 tablet 0  . oxyCODONE-acetaminophen (PERCOCET) 5-325 MG tablet Take 1 tablet by mouth every 4 (four) hours as needed for severe pain. (Patient not taking: Reported on 01/07/2018) 20 tablet 0   No current facility-administered medications for this visit.     OBJECTIVE: Vitals:   01/07/18 1149  BP: 137/89  Pulse: 81  Resp: 18  Temp: 98.2 F (36.8 C)     Body mass index is 38.14 kg/m.    ECOG FS:0 - Asymptomatic  General: Well-developed, well-nourished, no acute distress. Eyes: Pink conjunctiva, anicteric sclera. HEENT: Normocephalic, moist mucous membranes, clear oropharnyx. Lungs: Clear to auscultation bilaterally. Heart: Regular rate and rhythm. No rubs, murmurs, or gallops. Abdomen: Soft, nontender, nondistended. No organomegaly noted, normoactive bowel sounds. Musculoskeletal: No edema, cyanosis, or clubbing. Neuro: Alert, answering all questions appropriately. Cranial nerves grossly intact. Skin: No rashes or petechiae noted. Psych: Normal affect. Lymphatics: No cervical, calvicular, axillary or inguinal LAD.   LAB RESULTS:  Lab Results  Component Value Date   NA 139 10/31/2017   K 3.8 10/31/2017   CL 106 10/31/2017   CO2 25 10/31/2017   GLUCOSE 112 (H) 10/31/2017   BUN 12 10/31/2017   CREATININE 0.41 (L) 10/31/2017   CALCIUM 8.3 (L) 10/31/2017   PROT 7.3 10/31/2017   ALBUMIN 3.4 (L) 10/31/2017   AST 22 10/31/2017   ALT 22 10/31/2017   ALKPHOS 52 10/31/2017   BILITOT 0.6 10/31/2017   GFRNONAA >60 10/31/2017   GFRAA >60 10/31/2017    Lab Results  Component Value Date   WBC 5.9 11/07/2017   NEUTROABS 4.1 11/07/2017   HGB 12.2 11/07/2017   HCT 36.0 11/07/2017   MCV 84.8 11/07/2017   PLT 357 11/07/2017     STUDIES: Ms Digital Screening Tomo Bilateral  Result Date: 12/24/2017 CLINICAL DATA:  Screening. EXAM: DIGITAL SCREENING BILATERAL MAMMOGRAM WITH TOMO AND CAD COMPARISON:  Previous  exam(s). ACR Breast Density Category b: There are scattered areas of fibroglandular density. FINDINGS: There are no findings suspicious for malignancy. Images were processed with CAD. IMPRESSION: No mammographic evidence of malignancy. A result letter of this screening mammogram will be mailed directly to the patient. RECOMMENDATION: Screening mammogram in one year. (Code:SM-B-01Y) BI-RADS CATEGORY  1: Negative. Electronically Signed   By: Franki Cabot M.D.   On: 12/24/2017 12:47    ASSESSMENT: Progressive low-grade ovarian malignancy PLAN:   1.  Progressive low-grade ovarian malignancy: Case discussed with gynecology oncology.  CA-125 has trended up and on Nov 14, 2017 was reported at 204.4.  Patient has now had optimal debulking and recommendation is to perform 3 cycles of adjuvant chemotherapy using carboplatinum AUC 6 and Taxol 175 mg/m every 3 weeks for a total of 3 cycles.  This will be followed by letrozole maintenance therapy.  Initial plan was to return to clinic in 1 week to initiate cycle 1, but patient's daughter has requested to delay treatment 1 or 2 weeks so her mother can gain more strength prior to initiating treatment.  Treatment is tentatively scheduled for January 15, 2018, but this may be moved back based on the patient's wishes. 2. Endometrial cancer: Patient had total hysterectomy.  Imaging as above.  The entire visit was not done in the presence of interpreter.  I spent a total of 30 minutes face-to-face with the patient of which greater than 50% of the visit was spent in counseling and coordination of care as detailed above.   Patient expressed understanding and was in agreement with this plan. She also understands that She can call clinic at any time with any questions, concerns, or complaints.   Cancer Staging Endometrial cancer ([place]) Staging form: Corpus Uteri - Carcinoma, AJCC 7th Edition - Clinical: Stage I (T1, N0, M0) - Unsigned  Ovarian cancer, bilateral  ([place]) Staging form: Ovary, AJCC 7th Edition - Clinical: Stage Unknown (T1c, NX, M0) - Signed by Forest Gleason, MD on 11/27/2014   Lloyd Huger, MD   01/11/2018 12:14 PM

## 2018-01-07 ENCOUNTER — Encounter: Payer: Self-pay | Admitting: Oncology

## 2018-01-07 ENCOUNTER — Inpatient Hospital Stay: Payer: Self-pay | Attending: Oncology | Admitting: Oncology

## 2018-01-07 VITALS — BP 137/89 | HR 81 | Temp 98.2°F | Resp 18 | Wt 195.3 lb

## 2018-01-07 DIAGNOSIS — Z79899 Other long term (current) drug therapy: Secondary | ICD-10-CM | POA: Insufficient documentation

## 2018-01-07 DIAGNOSIS — Z803 Family history of malignant neoplasm of breast: Secondary | ICD-10-CM | POA: Insufficient documentation

## 2018-01-07 DIAGNOSIS — D649 Anemia, unspecified: Secondary | ICD-10-CM | POA: Insufficient documentation

## 2018-01-07 DIAGNOSIS — M1711 Unilateral primary osteoarthritis, right knee: Secondary | ICD-10-CM | POA: Insufficient documentation

## 2018-01-07 DIAGNOSIS — J45909 Unspecified asthma, uncomplicated: Secondary | ICD-10-CM | POA: Insufficient documentation

## 2018-01-07 DIAGNOSIS — D3910 Neoplasm of uncertain behavior of unspecified ovary: Secondary | ICD-10-CM

## 2018-01-07 DIAGNOSIS — Z8542 Personal history of malignant neoplasm of other parts of uterus: Secondary | ICD-10-CM | POA: Insufficient documentation

## 2018-01-07 DIAGNOSIS — Z87891 Personal history of nicotine dependence: Secondary | ICD-10-CM | POA: Insufficient documentation

## 2018-01-07 DIAGNOSIS — Z90710 Acquired absence of both cervix and uterus: Secondary | ICD-10-CM | POA: Insufficient documentation

## 2018-01-07 DIAGNOSIS — C569 Malignant neoplasm of unspecified ovary: Secondary | ICD-10-CM | POA: Insufficient documentation

## 2018-01-07 DIAGNOSIS — Z90722 Acquired absence of ovaries, bilateral: Secondary | ICD-10-CM | POA: Insufficient documentation

## 2018-01-07 DIAGNOSIS — Z5111 Encounter for antineoplastic chemotherapy: Secondary | ICD-10-CM | POA: Insufficient documentation

## 2018-01-07 NOTE — Progress Notes (Signed)
Pt in for follow up and to discuss treatment options. Interpreter present today.

## 2018-01-09 NOTE — Patient Instructions (Signed)
Paclitaxel injection Qu es este medicamento? El PACLITAXEL es un agente quimioteraputico. Este medicamento acta sobre las clulas que se dividen rpidamente, como las clulas cancerosas, y finalmente provoca la muerte de estas clulas. Se utiliza en el tratamiento del cncer de [place], mama y otros tipos de cncer. Este medicamento puede ser utilizado para otros usos; si tiene alguna pregunta consulte con su proveedor de atencin mdica o con su farmacutico. MARCAS COMUNES: Onxol, Taxol Qu le debo informar a mi profesional de la salud antes de tomar este medicamento? Necesita saber si usted presenta alguno de los siguientes problemas o situaciones: -trastornos sanguneos -pulso cardiaco irregular -infeccin (especialmente infecciones virales, como varicela o herpes) -enfermedad heptica -radioterapia reciente o continua -una reaccin alrgica o inusual al paclitaxel, alcohol, aceite de ricino polioxietilado, otros agentes quimioteraputicos, medicamentos, alimentos, colorantes o conservantes -si est embarazada o buscando quedar embarazada -si est amamantando a un beb Cmo debo utilizar este medicamento? Este medicamento se administra como infusin en una vena. Un profesional de la salud especialmente capacitado lo administra en un hospital o clnica. Hable con su pediatra para informarse acerca del uso de este medicamento en nios. Puede requerir atencin especial. Sobredosis: Pngase en contacto inmediatamente con un centro toxicolgico o una sala de urgencia si usted cree que haya tomado demasiado medicamento. ATENCIN: [employer] es solo para usted. No comparta este medicamento con nadie. Qu sucede si me olvido de una dosis? Es importante no olvidar ninguna dosis. Informe a su mdico o a su profesional de la salud si no puede asistir a Photographer. Qu puede interactuar con este medicamento? No tome esta medicina con ninguno de los siguientes  medicamentos: -disulfiram -metronidazol Esta medicina tambin puede interactuar con los siguientes medicamentos: -ciclosporina -diazepam -quetoconazol -medicamentos para incrementar los conteos sanguneos, tales como filgrastim, pegfilgrastim, sargramostim -otros agentes quimioteraputicos, tales como cisplatino, doxorrubicina, epirubicina, etopsido, teniposida, vincristina -quinidina -testosterona -vacunas -verapamilo Consulte a su mdico o a su profesional de la salud antes de tomar cualquiera de los siguientes medicamentos: -acetaminofeno -aspirina -ibuprofeno -quetoprofeno -naproxeno Puede ser que esta lista no menciona todas las posibles interacciones. Informe a su profesional de [employer]	[place] de [employer] productos a base de hierbas, medicamentos de [place] o suplementos nutritivos que est tomando. Si usted fuma, consume bebidas alcohlicas o si utiliza drogas ilegales, indqueselo tambin a su profesional de [employer]	[place]. Algunas sustancias pueden interactuar con su medicamento. A qu debo estar atento al usar [employer]? Se supervisar su estado de salud atentamente mientras reciba este medicamento. Tendr que hacerse anlisis de sangre importantes mientras est tomando este medicamento. Este medicamento puede causar Chief of Staff graves. Para reducir su riesgo, necesitar tomar otro(s) medicamento(s) antes del tratamiento con este medicamento. Si tiene [employer] erupcin cutnea, comezn/picazn o urticaria, hinchazn del rostro, los labios, o la [place], informe de inmediato a su mdico o profesional de Technical sales engineer. En algunos casos, podra recibir [employer] para ayudarlo con los efectos secundarios. Siga todas las instrucciones para usarlos. Este medicamento podra hacerle sentir un Nurse, mental health. Esto es normal ya que la quimioterapia afecta tanto a las clulas sanas como a las clulas cancerosas. Si presenta algn efecto secundario,  infrmelo. Sin embargo, contine con el tratamiento aun si se siente enfermo, a menos que su mdico le indique que lo suspenda. Consulte a su mdico o a su profesional de la salud si tiene fiebre, escalofros o dolor de garganta, o cualquier otro sntoma de resfro o gripe. No se trate usted mismo. Este medicamento  reduce la capacidad del cuerpo para combatir infecciones. Trate de no acercarse a personas que estn enfermas. Este medicamento podra aumentar el riesgo de moretones o sangrado. Consulte a su mdico o a su profesional de la salud si observa sangrados inusuales. Proceda con cuidado al cepillar sus dientes, usar hilo dental o Risk manager palillos para los dientes, ya que podra contraer una infeccin o Therapist, art con mayor facilidad. Si se somete a algn tratamiento dental, informe a su dentista que est [employer]. Evite tomar productos que contienen aspirina, acetaminofeno, ibuprofeno, naproxeno o ketoprofeno, a menos que as lo indique su mdico. Estos productos pueden ocultar la fiebre. No debe quedar embarazada mientras reciba este medicamento. Las mujeres deben informar a su mdico si estn buscando quedar embarazadas o si creen que estn embarazadas. Existe la posibilidad de efectos secundarios graves en un beb sin nacer. Para obtener ms informacin, hable con su profesional de la salud o su farmacutico. No debe amamantar a un beb mientras est tomando este medicamento. Para los hombres, se desaconseja tener nios mientras reciben [employer]. Este producto podra contener alcohol. Pregunte a Midwife o a su proveedor de atencin de la salud si este medicamento contiene alcohol. Asegrese de decirles a todos los proveedores de atencin de la salud que usted est tomando [place]. Ciertos medicamentos, como metronidazol y disulfiram, pueden causar una reaccin desagradable cuando se toman con alcohol. Esta reaccin incluye enrojecimiento, dolor de cabeza,  nuseas, vmitos, sudoracin y aumento de la sed. La reaccin puede durar de 30 minutos a varias horas. Qu efectos secundarios puedo tener al [employer] este medicamento? Efectos secundarios que debe informar a su mdico o a Barrister's clerk de la salud tan pronto como sea posible: Chief of Staff, como erupcin cutnea, picazn o urticarias, hinchazn de la cara, los labios o la lengua conteos sanguneos bajos - este frmaco puede reducir la cantidad de glbulos blancos, glbulos rojos y plaquetas. Su riesgo de infeccin y [place]. signos de infeccin - fiebre o escalofros, tos, dolor de garganta, dolor o dificultad para orinar signos de reduccin de plaquetas o sangrado - magulladuras, puntos rojos en la piel, heces de color oscuro o con aspecto alquitranado, sangrado por la nariz signos de reduccin de glbulos rojos - cansancio o debilidad inusual, desmayos, sensacin de mareo problemas respiratorios dolor en el pecho alta o baja presin sangunea llagas en la boca nuseas y vmito dolor, hinchazn, enrojecimiento o irritacin en el lugar de la [place], hormigueo, entumecimiento de las manos o los pies pulso cardiaco lento o irregular hinchazn de tobillos, pies, manos Efectos secundarios que, por lo general, no requieren atencin mdica (debe informarlos a su mdico o a su profesional de la salud si persisten o si son molestos): dolor de huesos cada total del cabello, incluyendo el cabello de la cabeza, de las [place], el vello pbico, las cejas y las pestaas cambios en el color de las uas de las manos diarrea aflojamiento de las uas de las manos prdida del apetito dolores musculares o articulares enrojecimiento de la piel sudoracin Puede ser que esta lista no menciona todos los posibles efectos secundarios. Comunquese a su mdico por asesoramiento mdico [employer] Usted puede informar los efectos secundarios a la FDA por telfono al  1-800-FDA-1088. Dnde debo guardar mi medicina? Este medicamento se administra en hospitales o clnicas y no necesitar guardarlo en su domicilio. ATENCIN: Este folleto es un resumen. Puede ser que no cubra toda la posible informacin. Si usted  tiene preguntas acerca de esta medicina, consulte con su mdico, su farmacutico o su profesional de Technical sales engineer.  2018 Elsevier/Gold Standard (2016-07-13 00:00:00) Carboplatin injection Qu es este medicamento? El CARBOPLATINO es un agente quimioteraputico. Este medicamento acta sobre las clulas que se dividen rpidamente, como las clulas cancergenas, y finalmente provoca la muerte de estas clulas. Se utiliza en el tratamiento del cncer de ovario y muchos otros tipos de Hotel manager. Este medicamento puede ser utilizado para otros usos; si tiene alguna pregunta consulte con su proveedor de atencin mdica o con su farmacutico. MARCAS COMUNES: Paraplatin Qu le debo informar a mi profesional de la salud antes de tomar este medicamento? Necesita saber si usted presenta alguno de los siguientes problemas o situaciones: -trastornos sanguneos -problemas auditivos -enfermedad renal -radioterapia reciente o continuada -una reaccin alrgica o inusual al carboplatino, al cisplatino, a otros agentes quimioteraputicos, a otros medicamentos, alimentos, colorantes o conservantes -si est embarazada o buscando quedar embarazada -si est amamantando a un beb Cmo debo utilizar este medicamento? Este medicamento se administra normalmente mediante infusin por va intravenosa. Lo administra un profesional de la salud calificado en un hospital o en un entorno clnico. Hable con su pediatra para informarse acerca del uso de este medicamento en nios. Puede requerir atencin especial. Sobredosis: Pngase en contacto inmediatamente con un centro toxicolgico o una sala de urgencia si usted cree que haya tomado demasiado medicamento. ATENCIN: [employer] es  solo para usted. No comparta este medicamento con nadie. Qu sucede si me olvido de una dosis? Es importante no olvidar ninguna dosis. Informe a su mdico o a su profesional de la salud si no puede asistir a Photographer. Qu puede interactuar con este medicamento? -medicamentos para convulsiones -medicamentos para incrementar los conteos sanguneos, tales como filgrastim, pegfilgrastim, sargramostim -ciertos antibiticos, tales como amicacina, gentamicina, neomicina, estreptomicina, tobramicina -vacunas Consulte a su mdico o a su profesional de la salud antes de tomar cualquiera de los siguientes medicamentos: -acetaminofeno -aspirina -ibuprofeno -quetoprofeno -naproxeno Puede ser que esta lista no menciona todas las posibles interacciones. Informe a su profesional de [employer]	[place] de [employer] productos a base de hierbas, medicamentos de [place] o suplementos nutritivos que est tomando. Si usted fuma, consume bebidas alcohlicas o si utiliza drogas ilegales, indqueselo tambin a su profesional de [employer]	[place]. Algunas sustancias pueden interactuar con su medicamento. A qu debo estar atento al usar [employer]? Se supervisar su estado de salud atentamente mientras reciba este medicamento. Tendr que hacerse anlisis de sangre peridicos mientras est tomando este medicamento. Este medicamento puede hacerle sentir un Nurse, mental health. Esto es normal ya que la quimioterapia afecta tanto a las clulas sanas como a las clulas cancerosas. Si presenta alguno de los [employer], infrmelos. Sin embargo, contine con el tratamiento aun si se siente enfermo, a menos que su mdico le indique que lo suspenda. En algunos casos, podr recibir [employer] para ayudarle con los efectos secundarios. Siga las instrucciones para usarlos. Consulte a su mdico o a su profesional de la salud por asesoramiento si tiene fiebre, escalofros, dolor de garganta o cualquier otro sntoma de resfro  o gripe. No se trate usted mismo. Este medicamento puede reducir la capacidad del cuerpo para combatir infecciones. Trate de no acercarse a personas que estn enfermas. [employer] puede aumentar el riesgo de magulladuras o sangrado. Consulte a su mdico o a su profesional de la salud si observa sangrados inusuales. Proceda con cuidado al cepillar sus dientes, usar hilo dental o utilizar palillos para  los dientes, ya que puede contraer una infeccin o Therapist, art con mayor facilidad. Si se somete a algn tratamiento dental, informe a su dentista que est [employer]. Evite tomar productos que contienen aspirina, acetaminofeno, ibuprofeno, naproxeno o quetoprofeno a menos que as lo indique su mdico. Estos productos pueden disimular la fiebre. No se debe quedar embarazada mientras recibe este medicamento. Las mujeres deben informar a su mdico si estn buscando quedar embarazadas o si creen que estn embarazadas. Existe la posibilidad de efectos secundarios graves a un beb sin nacer. Para ms informacin hable con su profesional de la salud o su farmacutico. No debe Economist a un beb mientras est usando este medicamento. Qu efectos secundarios puedo tener al [employer] este medicamento? Efectos secundarios que debe informar a su mdico o a Barrister's clerk de la salud tan pronto como sea posible: -Chief of Staff como erupcin cutnea, picazn o urticarias, hinchazn de la cara, labios o lengua -signos de infeccin - fiebre o escalofros, tos, dolor de garganta, dolor o dificultad para orinar -signos de reduccin de plaquetas o sangrado - magulladuras, puntos rojos en la piel, heces de color oscuro o con aspecto alquitranado, sangrando por la nariz -signos de reduccin de glbulos rojos - cansancio o debilidad inusual, desmayos, sensacin de [employer] -problemas respiratorios -cambios de audicin -cambios en la visin -dolor en el pecho -alta presin sangunea -conteos sanguneos  bajos - Este medicamento puede reducir la cantidad de glbulos blancos, glbulos rojos y plaquetas. Su riesgo de infeccin y sangrado puede ser mayor. -nuseas, vmito -dolor, enrojecimiento, hinchazn o Actor de la inyeccin -dolor, hormigueo, entumecimiento de manos o pies -problemas de coordinacin, del habla, al caminar -dificultad para orinar o cambios en el volumen de orina Efectos secundarios que, por lo general, no requieren atencin mdica (debe informarlos a su mdico o a su profesional de la salud si persisten o si son molestos): -cada del cabello -prdida del apetito -sabor metlico o cambios en el sentido del gusto Puede ser que esta lista no menciona todos los posibles efectos secundarios. Comunquese a su mdico por asesoramiento mdico [employer] Usted puede informar los efectos secundarios a la FDA por telfono al 1-800-FDA-1088. Dnde debo guardar mi medicina? Este medicamento se administra en hospitales o clnicas y no necesitar guardarlo en su domicilio. ATENCIN: Este folleto es un resumen. Puede ser que no cubra toda la posible informacin. Si usted tiene preguntas acerca de esta medicina, consulte con su mdico, su farmacutico o su profesional de Technical sales engineer.  2018 Elsevier/Gold Standard (2014-08-04 00:00:00) Paclitaxel injection What is this medicine? PACLITAXEL (PAK li TAX el) is a chemotherapy drug. It targets fast dividing cells, like cancer cells, and causes these cells to die. This medicine is used to treat ovarian cancer, breast cancer, and other cancers. This medicine may be used for other purposes; ask your health care provider or pharmacist if you have questions. COMMON BRAND NAME(S): Onxol, Taxol What should I tell my health care provider before I take this medicine? They need to know if you have any of these conditions: -blood disorders -irregular heartbeat -infection (especially a virus infection such as chickenpox,  cold sores, or herpes) -liver disease -previous or ongoing radiation therapy -an unusual or allergic reaction to paclitaxel, alcohol, polyoxyethylated castor oil, other chemotherapy agents, other medicines, foods, dyes, or preservatives -pregnant or trying to get pregnant -breast-feeding How should I use this medicine? This drug is given as an infusion into a vein. It is administered in a  hospital or clinic by a specially trained health care professional. Talk to your pediatrician regarding the use of this medicine in children. Special care may be needed. Overdosage: If you think you have taken too much of this medicine contact a poison control center or emergency room at once. NOTE: This medicine is only for you. Do not share this medicine with others. What if I miss a dose? It is important not to miss your dose. Call your doctor or health care professional if you are unable to keep an appointment. What may interact with this medicine? Do not take this medicine with any of the following medications: -disulfiram -metronidazole This medicine may also interact with the following medications: -cyclosporine -diazepam -ketoconazole -medicines to increase blood counts like filgrastim, pegfilgrastim, sargramostim -other chemotherapy drugs like cisplatin, doxorubicin, epirubicin, etoposide, teniposide, vincristine -quinidine -testosterone -vaccines -verapamil Talk to your doctor or health care professional before taking any of these medicines: -acetaminophen -aspirin -ibuprofen -ketoprofen -naproxen This list may not describe all possible interactions. Give your health care provider a list of all the medicines, herbs, non-prescription drugs, or dietary supplements you use. Also tell them if you smoke, drink alcohol, or use illegal drugs. Some items may interact with your medicine. What should I watch for while using this medicine? Your condition will be monitored carefully while you are  receiving this medicine. You will need important blood work done while you are taking this medicine. This medicine can cause serious allergic reactions. To reduce your risk you will need to take other medicine(s) before treatment with this medicine. If you experience allergic reactions like skin rash, itching or hives, swelling of the face, lips, or tongue, tell your doctor or health care professional right away. In some cases, you may be given additional medicines to help with side effects. Follow all directions for their use. This drug may make you feel generally unwell. This is not uncommon, as chemotherapy can affect healthy cells as well as cancer cells. Report any side effects. Continue your course of treatment even though you feel ill unless your doctor tells you to stop. Call your doctor or health care professional for advice if you get a fever, chills or sore throat, or other symptoms of a cold or flu. Do not treat yourself. This drug decreases your body's ability to fight infections. Try to avoid being around people who are sick. This medicine may increase your risk to bruise or bleed. Call your doctor or health care professional if you notice any unusual bleeding. Be careful brushing and flossing your teeth or using a toothpick because you may get an infection or bleed more easily. If you have any dental work done, tell your dentist you are receiving this medicine. Avoid taking products that contain aspirin, acetaminophen, ibuprofen, naproxen, or ketoprofen unless instructed by your doctor. These medicines may hide a fever. Do not become pregnant while taking this medicine. Women should inform their doctor if they wish to become pregnant or think they might be pregnant. There is a potential for serious side effects to an unborn child. Talk to your health care professional or pharmacist for more information. Do not breast-feed an infant while taking this medicine. Men are advised not to father a  child while receiving this medicine. This product may contain alcohol. Ask your pharmacist or healthcare provider if this medicine contains alcohol. Be sure to tell all healthcare providers you are taking this medicine. Certain medicines, like metronidazole and disulfiram, can cause an unpleasant reaction when taken  with alcohol. The reaction includes flushing, headache, nausea, vomiting, sweating, and increased thirst. The reaction can last from 30 minutes to several hours. What side effects may I notice from receiving this medicine? Side effects that you should report to your doctor or health care professional as soon as possible: -allergic reactions like skin rash, itching or hives, swelling of the face, lips, or tongue -low blood counts - This drug may decrease the number of white blood cells, red blood cells and platelets. You may be at increased risk for infections and bleeding. -signs of infection - fever or chills, cough, sore throat, pain or difficulty passing urine -signs of decreased platelets or bleeding - bruising, pinpoint red spots on the skin, black, tarry stools, nosebleeds -signs of decreased red blood cells - unusually weak or tired, fainting spells, lightheadedness -breathing problems -chest pain -high or low blood pressure -mouth sores -nausea and vomiting -pain, swelling, redness or irritation at the injection site -pain, tingling, numbness in the hands or feet -slow or irregular heartbeat -swelling of the ankle, feet, hands Side effects that usually do not require medical attention (report to your doctor or health care professional if they continue or are bothersome): -bone pain -complete hair loss including hair on your head, underarms, pubic hair, eyebrows, and eyelashes -changes in the color of fingernails -diarrhea -loosening of the fingernails -loss of appetite -muscle or joint pain -red flush to skin -sweating This list may not describe all possible side  effects. Call your doctor for medical advice about side effects. You may report side effects to FDA at 1-800-FDA-1088. Where should I keep my medicine? This drug is given in a hospital or clinic and will not be stored at home. NOTE: This sheet is a summary. It may not cover all possible information. If you have questions about this medicine, talk to your doctor, pharmacist, or health care provider.  2018 Elsevier/Gold Standard (2015-04-13 19:58:00) Carboplatin injection What is this medicine? CARBOPLATIN (KAR boe pla tin) is a chemotherapy drug. It targets fast dividing cells, like cancer cells, and causes these cells to die. This medicine is used to treat ovarian cancer and many other cancers. This medicine may be used for other purposes; ask your health care provider or pharmacist if you have questions. COMMON BRAND NAME(S): Paraplatin What should I tell my health care provider before I take this medicine? They need to know if you have any of these conditions: -blood disorders -hearing problems -kidney disease -recent or ongoing radiation therapy -an unusual or allergic reaction to carboplatin, cisplatin, other chemotherapy, other medicines, foods, dyes, or preservatives -pregnant or trying to get pregnant -breast-feeding How should I use this medicine? This drug is usually given as an infusion into a vein. It is administered in a hospital or clinic by a specially trained health care professional. Talk to your pediatrician regarding the use of this medicine in children. Special care may be needed. Overdosage: If you think you have taken too much of this medicine contact a poison control center or emergency room at once. NOTE: This medicine is only for you. Do not share this medicine with others. What if I miss a dose? It is important not to miss a dose. Call your doctor or health care professional if you are unable to keep an appointment. What may interact with this medicine? -medicines  for seizures -medicines to increase blood counts like filgrastim, pegfilgrastim, sargramostim -some antibiotics like amikacin, gentamicin, neomycin, streptomycin, tobramycin -vaccines Talk to your doctor or health care  professional before taking any of these medicines: -acetaminophen -aspirin -ibuprofen -ketoprofen -naproxen This list may not describe all possible interactions. Give your health care provider a list of all the medicines, herbs, non-prescription drugs, or dietary supplements you use. Also tell them if you smoke, drink alcohol, or use illegal drugs. Some items may interact with your medicine. What should I watch for while using this medicine? Your condition will be monitored carefully while you are receiving this medicine. You will need important blood work done while you are taking this medicine. This drug may make you feel generally unwell. This is not uncommon, as chemotherapy can affect healthy cells as well as cancer cells. Report any side effects. Continue your course of treatment even though you feel ill unless your doctor tells you to stop. In some cases, you may be given additional medicines to help with side effects. Follow all directions for their use. Call your doctor or health care professional for advice if you get a fever, chills or sore throat, or other symptoms of a cold or flu. Do not treat yourself. This drug decreases your body's ability to fight infections. Try to avoid being around people who are sick. This medicine may increase your risk to bruise or bleed. Call your doctor or health care professional if you notice any unusual bleeding. Be careful brushing and flossing your teeth or using a toothpick because you may get an infection or bleed more easily. If you have any dental work done, tell your dentist you are receiving this medicine. Avoid taking products that contain aspirin, acetaminophen, ibuprofen, naproxen, or ketoprofen unless instructed by your  doctor. These medicines may hide a fever. Do not become pregnant while taking this medicine. Women should inform their doctor if they wish to become pregnant or think they might be pregnant. There is a potential for serious side effects to an unborn child. Talk to your health care professional or pharmacist for more information. Do not breast-feed an infant while taking this medicine. What side effects may I notice from receiving this medicine? Side effects that you should report to your doctor or health care professional as soon as possible: -allergic reactions like skin rash, itching or hives, swelling of the face, lips, or tongue -signs of infection - fever or chills, cough, sore throat, pain or difficulty passing urine -signs of decreased platelets or bleeding - bruising, pinpoint red spots on the skin, black, tarry stools, nosebleeds -signs of decreased red blood cells - unusually weak or tired, fainting spells, lightheadedness -breathing problems -changes in hearing -changes in vision -chest pain -high blood pressure -low blood counts - This drug may decrease the number of white blood cells, red blood cells and platelets. You may be at increased risk for infections and bleeding. -nausea and vomiting -pain, swelling, redness or irritation at the injection site -pain, tingling, numbness in the hands or feet -problems with balance, talking, walking -trouble passing urine or change in the amount of urine Side effects that usually do not require medical attention (report to your doctor or health care professional if they continue or are bothersome): -hair loss -loss of appetite -metallic taste in the mouth or changes in taste This list may not describe all possible side effects. Call your doctor for medical advice about side effects. You may report side effects to FDA at 1-800-FDA-1088. Where should I keep my medicine? This drug is given in a hospital or clinic and will not be stored at  home. NOTE: This sheet is  a summary. It may not cover all possible information. If you have questions about this medicine, talk to your doctor, pharmacist, or health care provider.  2018 Elsevier/Gold Standard (2007-09-17 14:38:05)

## 2018-01-10 ENCOUNTER — Inpatient Hospital Stay: Payer: Self-pay

## 2018-01-11 ENCOUNTER — Encounter: Payer: Self-pay | Admitting: Pathology

## 2018-01-11 MED ORDER — PROCHLORPERAZINE MALEATE 10 MG PO TABS: 10.0000 mg | ORAL_TABLET | Freq: Four times a day (QID) | ORAL | 2 refills | Status: DC | PRN

## 2018-01-11 MED ORDER — ONDANSETRON HCL 8 MG PO TABS: 8.0000 mg | ORAL_TABLET | Freq: Two times a day (BID) | ORAL | 2 refills | Status: AC | PRN

## 2018-01-11 NOTE — Progress Notes (Signed)
START OFF PATHWAY REGIMEN - Ovarian   [id number]:Carboplatin AUC=6 + Paclitaxel 175 mg/m2 q21 Days:   A cycle is every 21 days:     Paclitaxel      Carboplatin   **Always confirm dose/schedule in your pharmacy ordering system**  Patient Characteristics: Postoperative without Neoadjuvant Therapy (Pathologic Staging), Newly Diagnosed, Adjuvant Therapy, Stage IIB and Stage IIIA/B/C Optimal Cytoreduction Therapeutic Status: Postoperative without Neoadjuvant Therapy (Pathologic Staging) BRCA Mutation Status: Absent AJCC 8 Stage Grouping: IIIB AJCC M Category: cM0 AJCC T Category: pTX AJCC N Category: pNX Intent of Therapy: Curative Intent, Discussed with Patient

## 2018-01-14 ENCOUNTER — Other Ambulatory Visit: Payer: Self-pay | Admitting: Oncology

## 2018-01-14 NOTE — Progress Notes (Signed)
[place]  [phone] [phone]  ID: Elizabeth Davenport OB: [birth year]  MR#: [id number]  LKG#:[id number]  Patient Care Team: Letta Median, MD as PCP - General (Family Medicine) Clent Jacks, RN as Registered Nurse Rico Junker, RN as Registered Nurse Theodore Demark, RN as Registered Nurse  CHIEF COMPLAINT: Progressive low-grade ovarian malignancy  INTERVAL HISTORY: Patient returns to clinic today for further evaluation and consideration of cycle 1 of 3 of carboplatinum and Taxol.  She is anxious, but otherwise feels well. She has no neurologic complaints.  She denies any recent fevers or illnesses.  She has a fair appetite, but denies weight loss.  She has no chest pain or shortness of breath.  She denies any abdominal pain or bloating.  She denies any nausea, vomiting, constipation, or diarrhea.  She has no urinary complaints.  Patient otherwise feels well and offers no further specific complaints today.  REVIEW OF SYSTEMS:   Review of Systems  Constitutional: Negative.  Negative for fever, malaise/fatigue and weight loss.  Respiratory: Negative.  Negative for cough and shortness of breath.   Cardiovascular: Negative.  Negative for chest pain and leg swelling.  Gastrointestinal: Negative.  Negative for abdominal pain, constipation, diarrhea, nausea and vomiting.  Genitourinary: Negative.  Negative for dysuria.  Musculoskeletal: Negative.  Negative for back pain.  Skin: Negative.  Negative for rash.  Neurological: Negative.  Negative for sensory change, focal weakness and weakness.  Psychiatric/Behavioral: The patient is nervous/anxious.     As per HPI. Otherwise, a complete review of systems is negative.  PAST MEDICAL HISTORY: Past Medical History:  Diagnosis Date  . Arthritis    left knee  . Asthma   . Endometrial cancer ([place]) 11/04/14  . PONV (postoperative nausea and vomiting)     PAST SURGICAL HISTORY: Past Surgical  History:  Procedure Laterality Date  . ABDOMINAL HYSTERECTOMY    . LAPAROSCOPIC BILATERAL SALPINGO OOPHERECTOMY Bilateral 11/04/2014   Procedure: LAPAROSCOPIC BILATERAL SALPINGO OOPHORECTOMY;  Surgeon: Mellody Drown, MD;  Location: [facility] ORS;  Service: Gynecology;  Laterality: Bilateral;  . LAPAROSCOPIC HYSTERECTOMY N/A 11/04/2014   Procedure: HYSTERECTOMY TOTAL LAPAROSCOPIC;  Surgeon: Mellody Drown, MD;  Location: [facility] ORS;  Service: Gynecology;  Laterality: N/A;  . removed mass from thyriod      FAMILY HISTORY: Family History  Problem Relation Age of Onset  . Breast cancer Sister 81  . Asthma Mother   . Coronary artery disease Mother   . Diabetes Brother   . Diabetes Sister   . Diabetes Sister   . Diabetes Brother     ADVANCED DIRECTIVES (Y/N):  N  HEALTH MAINTENANCE: Social History   Tobacco Use  . Smoking status: Former Smoker    Packs/day: 0.25    Years: 8.00    Pack years: 2.00    Types: Cigarettes    Last attempt to quit: 06/29/1990    Years since quitting: 27.5  . Smokeless tobacco: Never Used  . Tobacco comment: Smoking History Cigarettes 1 cig per day; quit 22 years ago; smoked for 8 years  Substance Use Topics  . Alcohol use: No    Alcohol/week: 0.0 oz    Comment: occasional   . Drug use: No     Colonoscopy:  PAP:  Bone density:  Lipid panel:  No Known Allergies  Current Outpatient Medications  Medication Sig Dispense Refill  . Vitamin D, Ergocalciferol, (DRISDOL) 50000 units CAPS capsule Take 50,000 Units by mouth every 7 (seven) days.     Marland Kitchen  albuterol (PROVENTIL HFA;VENTOLIN HFA) 108 (90 BASE) MCG/ACT inhaler Inhale 2 puffs into the lungs every 6 (six) hours as needed for wheezing or shortness of breath.    . fluticasone (FLOVENT HFA) 220 MCG/ACT inhaler Take by mouth.    Marland Kitchen HYDROmorphone (DILAUDID) 2 MG tablet Take by mouth.    . ondansetron (ZOFRAN ODT) 4 MG disintegrating tablet Take 1 tablet (4 mg total) by mouth every 8 (eight) hours as needed  for nausea or vomiting. (Patient not taking: Reported on 11/12/2017) 20 tablet 0  . ondansetron (ZOFRAN) 8 MG tablet Take 1 tablet (8 mg total) by mouth 2 (two) times daily as needed for refractory nausea / vomiting. (Patient not taking: Reported on 01/15/2018) 30 tablet 2  . oxyCODONE-acetaminophen (PERCOCET) 5-325 MG tablet Take 1 tablet by mouth every 4 (four) hours as needed for severe pain. (Patient not taking: Reported on 01/07/2018) 20 tablet 0  . prochlorperazine (COMPAZINE) 10 MG tablet Take 1 tablet (10 mg total) by mouth every 6 (six) hours as needed (Nausea or vomiting). (Patient not taking: Reported on 01/15/2018) 60 tablet 2   No current facility-administered medications for this visit.     OBJECTIVE: Vitals:   01/15/18 0911  BP: 133/90  Pulse: 79  Resp: 18  Temp: (!) 96.2 F (35.7 C)     Body mass index is 38.34 kg/m.    ECOG FS:0 - Asymptomatic  General: Well-developed, well-nourished, no acute distress. Eyes: Pink conjunctiva, anicteric sclera. Lungs: Clear to auscultation bilaterally. Heart: Regular rate and rhythm. No rubs, murmurs, or gallops. Abdomen: Soft, nontender, nondistended. No organomegaly noted, normoactive bowel sounds. Musculoskeletal: No edema, cyanosis, or clubbing. Neuro: Alert, answering all questions appropriately. Cranial nerves grossly intact. Skin: No rashes or petechiae noted. Psych: Normal affect.   LAB RESULTS:  Lab Results  Component Value Date   NA 142 01/15/2018   K 4.4 01/15/2018   CL 108 01/15/2018   CO2 24 01/15/2018   GLUCOSE 100 (H) 01/15/2018   BUN 11 01/15/2018   CREATININE 0.47 01/15/2018   CALCIUM 8.9 01/15/2018   PROT 7.2 01/15/2018   ALBUMIN 3.5 01/15/2018   AST 19 01/15/2018   ALT 13 01/15/2018   ALKPHOS 51 01/15/2018   BILITOT 0.6 01/15/2018   GFRNONAA >60 01/15/2018   GFRAA >60 01/15/2018    Lab Results  Component Value Date   WBC 5.4 01/15/2018   NEUTROABS 3.4 01/15/2018   HGB 11.3 (L) 01/15/2018   HCT  34.3 (L) 01/15/2018   MCV 80.7 01/15/2018   PLT 265 01/15/2018     STUDIES: Ms Digital Screening Tomo Bilateral  Result Date: 12/24/2017 CLINICAL DATA:  Screening. EXAM: DIGITAL SCREENING BILATERAL MAMMOGRAM WITH TOMO AND CAD COMPARISON:  Previous exam(s). ACR Breast Density Category b: There are scattered areas of fibroglandular density. FINDINGS: There are no findings suspicious for malignancy. Images were processed with CAD. IMPRESSION: No mammographic evidence of malignancy. A result letter of this screening mammogram will be mailed directly to the patient. RECOMMENDATION: Screening mammogram in one year. (Code:SM-B-01Y) BI-RADS CATEGORY  1: Negative. Electronically Signed   By: Franki Cabot M.D.   On: 12/24/2017 12:47    ASSESSMENT: Progressive low-grade ovarian malignancy. PLAN:   1.  Progressive low-grade ovarian malignancy: Case discussed with gynecology oncology.  CA-125 has trended up and on Nov 14, 2017 was reported at 204.4.  Patient has now had optimal debulking and recommendation is to proceed with 3 cycles of adjuvant chemotherapy using carboplatinum AUC 6 and Taxol 175  mg/m every 3 weeks for a total of 3 cycles.  This will be followed by letrozole maintenance therapy.  Proceed with cycle 1 of carboplatinum and Taxol today.  Return to clinic in 1 week for laboratory work only and then in 3 weeks for further evaluation and consideration of cycle 2.   2. Endometrial cancer: Patient had total hysterectomy.  Imaging as above. 3.  Anemia: Mild, monitor.  The entire visit was not done in the presence of interpreter.  I spent a total of 30 minutes face-to-face with the patient of which greater than 50% of the visit was spent in counseling and coordination of care as detailed above.   Patient expressed understanding and was in agreement with this plan. She also understands that She can call clinic at any time with any questions, concerns, or complaints.   Cancer Staging Borderline  epithelial neoplasm of ovary Staging form: Ovary, Fallopian Tube, and Primary Peritoneal Carcinoma, AJCC 8th Edition - Clinical stage from 01/11/2018: FIGO Stage III (cT3, cN0, cM0) - Signed by Lloyd Huger, MD on 01/11/2018  Endometrial cancer [facility]) Staging form: Corpus Uteri - Carcinoma, AJCC 7th Edition - Clinical: Stage I (T1, N0, M0) - Unsigned  Ovarian cancer, bilateral ([place]) Staging form: Ovary, AJCC 7th Edition - Clinical: Stage Unknown (T1c, NX, M0) - Signed by Forest Gleason, MD on 11/27/2014   Lloyd Huger, MD   01/18/2018 9:17 AM

## 2018-01-15 ENCOUNTER — Inpatient Hospital Stay: Payer: Self-pay

## 2018-01-15 ENCOUNTER — Inpatient Hospital Stay (HOSPITAL_BASED_OUTPATIENT_CLINIC_OR_DEPARTMENT_OTHER): Payer: Self-pay | Admitting: Oncology

## 2018-01-15 ENCOUNTER — Other Ambulatory Visit: Payer: Self-pay

## 2018-01-15 VITALS — BP 114/76 | HR 67 | Resp 18

## 2018-01-15 VITALS — BP 133/90 | HR 79 | Temp 96.2°F | Resp 18 | Wt 196.3 lb

## 2018-01-15 DIAGNOSIS — Z90722 Acquired absence of ovaries, bilateral: Secondary | ICD-10-CM

## 2018-01-15 DIAGNOSIS — Z5111 Encounter for antineoplastic chemotherapy: Secondary | ICD-10-CM

## 2018-01-15 DIAGNOSIS — Z8542 Personal history of malignant neoplasm of other parts of uterus: Secondary | ICD-10-CM

## 2018-01-15 DIAGNOSIS — Z803 Family history of malignant neoplasm of breast: Secondary | ICD-10-CM

## 2018-01-15 DIAGNOSIS — M1711 Unilateral primary osteoarthritis, right knee: Secondary | ICD-10-CM

## 2018-01-15 DIAGNOSIS — Z87891 Personal history of nicotine dependence: Secondary | ICD-10-CM

## 2018-01-15 DIAGNOSIS — C569 Malignant neoplasm of unspecified ovary: Secondary | ICD-10-CM

## 2018-01-15 DIAGNOSIS — Z90710 Acquired absence of both cervix and uterus: Secondary | ICD-10-CM

## 2018-01-15 DIAGNOSIS — D649 Anemia, unspecified: Secondary | ICD-10-CM

## 2018-01-15 DIAGNOSIS — Z79899 Other long term (current) drug therapy: Secondary | ICD-10-CM

## 2018-01-15 DIAGNOSIS — D3910 Neoplasm of uncertain behavior of unspecified ovary: Secondary | ICD-10-CM

## 2018-01-15 DIAGNOSIS — J45909 Unspecified asthma, uncomplicated: Secondary | ICD-10-CM

## 2018-01-15 LAB — COMPREHENSIVE METABOLIC PANEL
ALK PHOS: 51 U/L (ref 38–126)
ALT: 13 U/L (ref 0–44)
AST: 19 U/L (ref 15–41)
Albumin: 3.5 g/dL (ref 3.5–5.0)
Anion gap: 10 (ref 5–15)
BUN: 11 mg/dL (ref 6–20)
CALCIUM: 8.9 mg/dL (ref 8.9–10.3)
CO2: 24 mmol/L (ref 22–32)
Chloride: 108 mmol/L (ref 98–111)
Creatinine, Ser: 0.47 mg/dL (ref 0.44–1.00)
GFR calc non Af Amer: >60 mL/min (ref >60)
Glucose, Bld: 100 mg/dL — ABNORMAL HIGH (ref 70–99)
Potassium: 4.4 mmol/L (ref 3.5–5.1)
SODIUM: 142 mmol/L (ref 135–145)
Total Bilirubin: 0.6 mg/dL (ref 0.3–1.2)
Total Protein: 7.2 g/dL (ref 6.5–8.1)

## 2018-01-15 LAB — CBC WITH DIFFERENTIAL/PLATELET
BASOS PCT: 1 %
Basophils Absolute: 0 10*3/uL (ref 0–0.1)
EOS ABS: 0.1 10*3/uL (ref 0–0.7)
Eosinophils Relative: 2 %
HCT: 34.3 % — ABNORMAL LOW (ref 35.0–47.0)
HEMOGLOBIN: 11.3 g/dL — AB (ref 12.0–16.0)
LYMPHS ABS: 1.4 10*3/uL (ref 1.0–3.6)
Lymphocytes Relative: 27 %
MCH: 26.5 pg (ref 26.0–34.0)
MCHC: 32.9 g/dL (ref 32.0–36.0)
MCV: 80.7 fL (ref 80.0–100.0)
MONO ABS: 0.4 10*3/uL (ref 0.2–0.9)
MONOS PCT: 8 %
NEUTROS PCT: 64 %
Neutro Abs: 3.4 10*3/uL (ref 1.4–6.5)
PLATELETS: 265 10*3/uL (ref 150–440)
RBC: 4.25 MIL/uL (ref 3.80–5.20)
RDW: 15.9 % — AB (ref 11.5–14.5)
WBC: 5.4 10*3/uL (ref 3.6–11.0)

## 2018-01-15 MED ORDER — SODIUM CHLORIDE 0.9 % IV SOLN
750.0000 mg | Freq: Once | INTRAVENOUS | Status: AC
  Administered 2018-01-15: 750 mg via INTRAVENOUS
  Filled 2018-01-15: qty 75

## 2018-01-15 MED ORDER — DEXAMETHASONE SODIUM PHOSPHATE 10 MG/ML IJ SOLN
10.0000 mg | Freq: Once | INTRAMUSCULAR | Status: AC
  Administered 2018-01-15: 10 mg via INTRAVENOUS
  Filled 2018-01-15: qty 1

## 2018-01-15 MED ORDER — DIPHENHYDRAMINE HCL 50 MG/ML IJ SOLN
25.0000 mg | Freq: Once | INTRAMUSCULAR | Status: AC
  Administered 2018-01-15: 25 mg via INTRAVENOUS
  Filled 2018-01-15: qty 1

## 2018-01-15 MED ORDER — SODIUM CHLORIDE 0.9 % IV SOLN: 10.0000 mg | Freq: Once | INTRAVENOUS | Status: DC

## 2018-01-15 MED ORDER — PALONOSETRON HCL INJECTION 0.25 MG/5ML
0.2500 mg | Freq: Once | INTRAVENOUS | Status: AC
  Administered 2018-01-15: 0.25 mg via INTRAVENOUS
  Filled 2018-01-15: qty 5

## 2018-01-15 MED ORDER — FAMOTIDINE IN NACL 20-0.9 MG/50ML-% IV SOLN
20.0000 mg | Freq: Once | INTRAVENOUS | Status: AC
  Administered 2018-01-15: 20 mg via INTRAVENOUS
  Filled 2018-01-15: qty 50

## 2018-01-15 MED ORDER — SODIUM CHLORIDE 0.9 % IV SOLN
Freq: Once | INTRAVENOUS | Status: AC
  Administered 2018-01-15: 10:00:00 via INTRAVENOUS
  Filled 2018-01-15: qty 1000

## 2018-01-15 MED ORDER — SODIUM CHLORIDE 0.9 % IV SOLN
175.0000 mg/m2 | Freq: Once | INTRAVENOUS | Status: AC
  Administered 2018-01-15: 342 mg via INTRAVENOUS
  Filled 2018-01-15: qty 57

## 2018-01-15 NOTE — Progress Notes (Signed)
Here for follow up. Per pt " Im ok right now"

## 2018-01-22 ENCOUNTER — Other Ambulatory Visit: Payer: Self-pay

## 2018-01-22 ENCOUNTER — Inpatient Hospital Stay: Payer: Self-pay

## 2018-01-22 DIAGNOSIS — D3910 Neoplasm of uncertain behavior of unspecified ovary: Secondary | ICD-10-CM

## 2018-01-22 LAB — CBC WITH DIFFERENTIAL/PLATELET
Basophils Absolute: 0 10*3/uL (ref 0–0.1)
Basophils Relative: 1 %
EOS PCT: 4 %
Eosinophils Absolute: 0.2 10*3/uL (ref 0–0.7)
HCT: 34.9 % — ABNORMAL LOW (ref 35.0–47.0)
Hemoglobin: 11.4 g/dL — ABNORMAL LOW (ref 12.0–16.0)
LYMPHS PCT: 31 %
Lymphs Abs: 1.2 10*3/uL (ref 1.0–3.6)
MCH: 26.4 pg (ref 26.0–34.0)
MCHC: 32.7 g/dL (ref 32.0–36.0)
MCV: 80.7 fL (ref 80.0–100.0)
MONOS PCT: 2 %
Monocytes Absolute: 0.1 10*3/uL — ABNORMAL LOW (ref 0.2–0.9)
Neutro Abs: 2.5 10*3/uL (ref 1.4–6.5)
Neutrophils Relative %: 62 %
PLATELETS: 231 10*3/uL (ref 150–440)
RBC: 4.32 MIL/uL (ref 3.80–5.20)
RDW: 16.2 % — ABNORMAL HIGH (ref 11.5–14.5)
WBC: 4 10*3/uL (ref 3.6–11.0)

## 2018-01-22 LAB — COMPREHENSIVE METABOLIC PANEL
ALT: 21 U/L (ref 0–44)
AST: 22 U/L (ref 15–41)
Albumin: 3.7 g/dL (ref 3.5–5.0)
Alkaline Phosphatase: 65 U/L (ref 38–126)
Anion gap: 9 (ref 5–15)
BUN: 12 mg/dL (ref 6–20)
CO2: 24 mmol/L (ref 22–32)
Calcium: 9.1 mg/dL (ref 8.9–10.3)
Chloride: 104 mmol/L (ref 98–111)
Creatinine, Ser: 0.42 mg/dL — ABNORMAL LOW (ref 0.44–1.00)
GFR calc Af Amer: >60 mL/min (ref >60)
GFR calc non Af Amer: >60 mL/min (ref >60)
Glucose, Bld: 103 mg/dL — ABNORMAL HIGH (ref 70–99)
Potassium: 3.6 mmol/L (ref 3.5–5.1)
Sodium: 137 mmol/L (ref 135–145)
Total Bilirubin: 0.5 mg/dL (ref 0.3–1.2)
Total Protein: 7.7 g/dL (ref 6.5–8.1)

## 2018-01-23 LAB — CA 125: Cancer Antigen (CA) 125: 322 U/mL — ABNORMAL HIGH (ref 0.0–38.1)

## 2018-02-03 NOTE — Progress Notes (Signed)
[place]  [phone]) [phone] [phone]) [phone]  ID: Elizabeth Davenport OB: [birth year]  MR#: [id number]  TIW#:[id number]  Patient Care Team: Letta Median, MD as PCP - General (Family Medicine) Clent Jacks, RN as Registered Nurse Rico Junker, RN as Registered Nurse Theodore Demark, RN as Registered Nurse  CHIEF COMPLAINT: Progressive low-grade ovarian malignancy  INTERVAL HISTORY: Patient returns to clinic today for further evaluation and consideration of cycle 2 of 3 of carboplatinum and Taxol.  She tolerated her first treatment well without significant side effects.  She continues to be highly anxious. She has no neurologic complaints.  She denies any recent fevers or illnesses.  She has a good appetite and denies weight loss.  She has no chest pain or shortness of breath.  She denies any abdominal pain or bloating.  She denies any nausea, vomiting, constipation, or diarrhea.  She has no urinary complaints.  Patient offers no further specific complaints today.  REVIEW OF SYSTEMS:   Review of Systems  Constitutional: Negative.  Negative for fever, malaise/fatigue and weight loss.  Respiratory: Negative.  Negative for cough and shortness of breath.   Cardiovascular: Negative.  Negative for chest pain and leg swelling.  Gastrointestinal: Negative.  Negative for abdominal pain, constipation, diarrhea, nausea and vomiting.  Genitourinary: Negative.  Negative for dysuria.  Musculoskeletal: Negative.  Negative for back pain.  Skin: Negative.  Negative for rash.  Neurological: Negative.  Negative for sensory change, focal weakness and weakness.  Psychiatric/Behavioral: The patient is nervous/anxious.     As per HPI. Otherwise, a complete review of systems is negative.  PAST MEDICAL HISTORY: Past Medical History:  Diagnosis Date  . Arthritis    left knee  . Asthma   . Endometrial cancer ([place]) 11/04/14  . PONV (postoperative nausea and vomiting)       PAST SURGICAL HISTORY: Past Surgical History:  Procedure Laterality Date  . ABDOMINAL HYSTERECTOMY    . LAPAROSCOPIC BILATERAL SALPINGO OOPHERECTOMY Bilateral 11/04/2014   Procedure: LAPAROSCOPIC BILATERAL SALPINGO OOPHORECTOMY;  Surgeon: Mellody Drown, MD;  Location: [facility] ORS;  Service: Gynecology;  Laterality: Bilateral;  . LAPAROSCOPIC HYSTERECTOMY N/A 11/04/2014   Procedure: HYSTERECTOMY TOTAL LAPAROSCOPIC;  Surgeon: Mellody Drown, MD;  Location: [facility] ORS;  Service: Gynecology;  Laterality: N/A;  . removed mass from thyriod      FAMILY HISTORY: Family History  Problem Relation Age of Onset  . Breast cancer Sister 36  . Asthma Mother   . Coronary artery disease Mother   . Diabetes Brother   . Diabetes Sister   . Diabetes Sister   . Diabetes Brother     ADVANCED DIRECTIVES (Y/N):  N  HEALTH MAINTENANCE: Social History   Tobacco Use  . Smoking status: Former Smoker    Packs/day: 0.25    Years: 8.00    Pack years: 2.00    Types: Cigarettes    Last attempt to quit: 06/29/1990    Years since quitting: 27.6  . Smokeless tobacco: Never Used  . Tobacco comment: Smoking History Cigarettes 1 cig per day; quit 22 years ago; smoked for 8 years  Substance Use Topics  . Alcohol use: No    Alcohol/week: 0.0 standard drinks    Comment: occasional   . Drug use: No     Colonoscopy:  PAP:  Bone density:  Lipid panel:  No Known Allergies  Current Outpatient Medications  Medication Sig Dispense Refill  . albuterol (PROVENTIL HFA;VENTOLIN HFA) 108 (90 BASE) MCG/ACT inhaler Inhale 2  puffs into the lungs every 6 (six) hours as needed for wheezing or shortness of breath.    . fluticasone (FLOVENT HFA) 220 MCG/ACT inhaler Take by mouth.    Marland Kitchen HYDROmorphone (DILAUDID) 2 MG tablet Take by mouth.    . ondansetron (ZOFRAN ODT) 4 MG disintegrating tablet Take 1 tablet (4 mg total) by mouth every 8 (eight) hours as needed for nausea or vomiting. 20 tablet 0  . ondansetron (ZOFRAN) 8  MG tablet Take 1 tablet (8 mg total) by mouth 2 (two) times daily as needed for refractory nausea / vomiting. 30 tablet 2  . oxyCODONE-acetaminophen (PERCOCET) 5-325 MG tablet Take 1 tablet by mouth every 4 (four) hours as needed for severe pain. 20 tablet 0  . prochlorperazine (COMPAZINE) 10 MG tablet Take 1 tablet (10 mg total) by mouth every 6 (six) hours as needed (Nausea or vomiting). 60 tablet 2  . ALPRAZolam (XANAX) 0.25 MG tablet Take 1 tablet (0.25 mg total) by mouth 2 (two) times daily as needed for anxiety. 60 tablet 0   No current facility-administered medications for this visit.     OBJECTIVE: Vitals:   02/05/18 0847  BP: 127/85  Pulse: 75  Resp: 20  Temp: (!) 97.5 F (36.4 C)     Body mass index is 38.1 kg/m.    ECOG FS:0 - Asymptomatic  General: Well-developed, well-nourished, no acute distress. Eyes: Pink conjunctiva, anicteric sclera. HEENT: Normocephalic, moist mucous membranes. Lungs: Clear to auscultation bilaterally. Heart: Regular rate and rhythm. No rubs, murmurs, or gallops. Abdomen: Soft, nontender, nondistended. No organomegaly noted, normoactive bowel sounds. Musculoskeletal: No edema, cyanosis, or clubbing. Neuro: Alert, answering all questions appropriately. Cranial nerves grossly intact. Skin: No rashes or petechiae noted. Psych: Normal affect.   LAB RESULTS:  Lab Results  Component Value Date   NA 139 02/05/2018   K 3.9 02/05/2018   CL 108 02/05/2018   CO2 23 02/05/2018   GLUCOSE 86 02/05/2018   BUN 11 02/05/2018   CREATININE 0.37 (L) 02/05/2018   CALCIUM 8.9 02/05/2018   PROT 7.3 02/05/2018   ALBUMIN 3.7 02/05/2018   AST 25 02/05/2018   ALT 20 02/05/2018   ALKPHOS 60 02/05/2018   BILITOT 0.5 02/05/2018   GFRNONAA >60 02/05/2018   GFRAA >60 02/05/2018    Lab Results  Component Value Date   WBC 4.7 02/05/2018   NEUTROABS 2.8 02/05/2018   HGB 11.9 (L) 02/05/2018   HCT 35.5 02/05/2018   MCV 80.9 02/05/2018   PLT 211 02/05/2018      STUDIES: No results found.  ASSESSMENT: Progressive low-grade ovarian malignancy. PLAN:   1.  Progressive low-grade ovarian malignancy: Case discussed with gynecology oncology.  Patient's most recent Ca-125 has trended down slightly to 314.0.  Patient previously underwent optimal debulking.  Proceed with cycle 2 of 3 of carboplatinum AUC 6 and Taxol 175 mg/m today. This will be followed by letrozole maintenance therapy.  Return to clinic in 3 weeks for further evaluation and consideration of cycle 3.    2. Endometrial cancer: Patient had total hysterectomy.  Imaging as above. 3.  Anemia: Hemoglobin improved to 11.9 today.  Monitor. 4.  Anxiety: Patient was given a prescription for Xanax as needed.  The entire visit was not done in the presence of interpreter.    Patient expressed understanding and was in agreement with this plan. She also understands that She can call clinic at any time with any questions, concerns, or complaints.   Cancer Staging Borderline epithelial  neoplasm of ovary Staging form: Ovary, Fallopian Tube, and Primary Peritoneal Carcinoma, AJCC 8th Edition - Clinical stage from 01/11/2018: FIGO Stage III (cT3, cN0, cM0) - Signed by Lloyd Huger, MD on 01/11/2018  Endometrial cancer [facility]) Staging form: Corpus Uteri - Carcinoma, AJCC 7th Edition - Clinical: Stage I (T1, N0, M0) - Unsigned  Ovarian cancer, bilateral ([place]) Staging form: Ovary, AJCC 7th Edition - Clinical: Stage Unknown (T1c, NX, M0) - Signed by Forest Gleason, MD on 11/27/2014   Lloyd Huger, MD   02/08/2018 2:58 PM

## 2018-02-05 ENCOUNTER — Other Ambulatory Visit: Payer: Self-pay

## 2018-02-05 ENCOUNTER — Encounter: Payer: Self-pay | Admitting: Oncology

## 2018-02-05 ENCOUNTER — Inpatient Hospital Stay: Payer: Self-pay | Attending: Oncology

## 2018-02-05 ENCOUNTER — Inpatient Hospital Stay (HOSPITAL_BASED_OUTPATIENT_CLINIC_OR_DEPARTMENT_OTHER): Payer: Self-pay | Admitting: Oncology

## 2018-02-05 ENCOUNTER — Inpatient Hospital Stay: Payer: Self-pay

## 2018-02-05 VITALS — BP 127/85 | HR 75 | Temp 97.5°F | Resp 20 | Wt 195.1 lb

## 2018-02-05 DIAGNOSIS — D3910 Neoplasm of uncertain behavior of unspecified ovary: Secondary | ICD-10-CM

## 2018-02-05 DIAGNOSIS — M129 Arthropathy, unspecified: Secondary | ICD-10-CM

## 2018-02-05 DIAGNOSIS — Z87891 Personal history of nicotine dependence: Secondary | ICD-10-CM

## 2018-02-05 DIAGNOSIS — C563 Malignant neoplasm of bilateral ovaries: Secondary | ICD-10-CM

## 2018-02-05 DIAGNOSIS — Z803 Family history of malignant neoplasm of breast: Secondary | ICD-10-CM

## 2018-02-05 DIAGNOSIS — C561 Malignant neoplasm of right ovary: Secondary | ICD-10-CM | POA: Insufficient documentation

## 2018-02-05 DIAGNOSIS — Z5111 Encounter for antineoplastic chemotherapy: Secondary | ICD-10-CM | POA: Insufficient documentation

## 2018-02-05 DIAGNOSIS — J45909 Unspecified asthma, uncomplicated: Secondary | ICD-10-CM | POA: Insufficient documentation

## 2018-02-05 DIAGNOSIS — C541 Malignant neoplasm of endometrium: Secondary | ICD-10-CM | POA: Insufficient documentation

## 2018-02-05 DIAGNOSIS — F419 Anxiety disorder, unspecified: Secondary | ICD-10-CM

## 2018-02-05 DIAGNOSIS — Z79899 Other long term (current) drug therapy: Secondary | ICD-10-CM

## 2018-02-05 DIAGNOSIS — D649 Anemia, unspecified: Secondary | ICD-10-CM | POA: Insufficient documentation

## 2018-02-05 DIAGNOSIS — C562 Malignant neoplasm of left ovary: Secondary | ICD-10-CM | POA: Insufficient documentation

## 2018-02-05 LAB — COMPREHENSIVE METABOLIC PANEL
ALBUMIN: 3.7 g/dL (ref 3.5–5.0)
ALK PHOS: 60 U/L (ref 38–126)
ALT: 20 U/L (ref 0–44)
AST: 25 U/L (ref 15–41)
Anion gap: 8 (ref 5–15)
BILIRUBIN TOTAL: 0.5 mg/dL (ref 0.3–1.2)
BUN: 11 mg/dL (ref 6–20)
CALCIUM: 8.9 mg/dL (ref 8.9–10.3)
CO2: 23 mmol/L (ref 22–32)
Chloride: 108 mmol/L (ref 98–111)
Creatinine, Ser: 0.37 mg/dL — ABNORMAL LOW (ref 0.44–1.00)
GFR calc Af Amer: >60 mL/min (ref >60)
GFR calc non Af Amer: >60 mL/min (ref >60)
GLUCOSE: 86 mg/dL (ref 70–99)
Potassium: 3.9 mmol/L (ref 3.5–5.1)
SODIUM: 139 mmol/L (ref 135–145)
TOTAL PROTEIN: 7.3 g/dL (ref 6.5–8.1)

## 2018-02-05 LAB — CBC WITH DIFFERENTIAL/PLATELET
BASOS ABS: 0 10*3/uL (ref 0–0.1)
BASOS PCT: 0 %
Eosinophils Absolute: 0.1 10*3/uL (ref 0–0.7)
Eosinophils Relative: 1 %
HEMATOCRIT: 35.5 % (ref 35.0–47.0)
Hemoglobin: 11.9 g/dL — ABNORMAL LOW (ref 12.0–16.0)
Lymphocytes Relative: 32 %
Lymphs Abs: 1.5 10*3/uL (ref 1.0–3.6)
MCH: 27.1 pg (ref 26.0–34.0)
MCHC: 33.5 g/dL (ref 32.0–36.0)
MCV: 80.9 fL (ref 80.0–100.0)
MONO ABS: 0.4 10*3/uL (ref 0.2–0.9)
MONOS PCT: 8 %
Neutro Abs: 2.8 10*3/uL (ref 1.4–6.5)
Neutrophils Relative %: 59 %
PLATELETS: 211 10*3/uL (ref 150–440)
RBC: 4.39 MIL/uL (ref 3.80–5.20)
RDW: 18 % — AB (ref 11.5–14.5)
WBC: 4.7 10*3/uL (ref 3.6–11.0)

## 2018-02-05 MED ORDER — SODIUM CHLORIDE 0.9 % IV SOLN
175.0000 mg/m2 | Freq: Once | INTRAVENOUS | Status: AC
  Administered 2018-02-05: 342 mg via INTRAVENOUS
  Filled 2018-02-05: qty 57

## 2018-02-05 MED ORDER — PALONOSETRON HCL INJECTION 0.25 MG/5ML
0.2500 mg | Freq: Once | INTRAVENOUS | Status: AC
  Administered 2018-02-05: 0.25 mg via INTRAVENOUS
  Filled 2018-02-05: qty 5

## 2018-02-05 MED ORDER — SODIUM CHLORIDE 0.9 % IV SOLN
Freq: Once | INTRAVENOUS | Status: AC
  Administered 2018-02-05: 10:00:00 via INTRAVENOUS
  Filled 2018-02-05: qty 1000

## 2018-02-05 MED ORDER — FAMOTIDINE IN NACL 20-0.9 MG/50ML-% IV SOLN
20.0000 mg | Freq: Once | INTRAVENOUS | Status: AC
  Administered 2018-02-05: 20 mg via INTRAVENOUS
  Filled 2018-02-05: qty 50

## 2018-02-05 MED ORDER — DIPHENHYDRAMINE HCL 50 MG/ML IJ SOLN
25.0000 mg | Freq: Once | INTRAMUSCULAR | Status: AC
  Administered 2018-02-05: 25 mg via INTRAVENOUS
  Filled 2018-02-05: qty 1

## 2018-02-05 MED ORDER — SODIUM CHLORIDE 0.9 % IV SOLN
750.0000 mg | Freq: Once | INTRAVENOUS | Status: AC
  Administered 2018-02-05: 750 mg via INTRAVENOUS
  Filled 2018-02-05: qty 75

## 2018-02-05 MED ORDER — DEXAMETHASONE SODIUM PHOSPHATE 10 MG/ML IJ SOLN
10.0000 mg | Freq: Once | INTRAMUSCULAR | Status: AC
  Administered 2018-02-05: 10 mg via INTRAVENOUS
  Filled 2018-02-05: qty 1

## 2018-02-05 MED ORDER — SODIUM CHLORIDE 0.9 % IV SOLN: 10.0000 mg | Freq: Once | INTRAVENOUS | Status: DC

## 2018-02-05 NOTE — Progress Notes (Signed)
Patient denies any concerns today.  

## 2018-02-06 LAB — CA 125: CANCER ANTIGEN (CA) 125: 314 U/mL — AB (ref 0.0–38.1)

## 2018-02-08 ENCOUNTER — Other Ambulatory Visit: Payer: Self-pay | Admitting: *Deleted

## 2018-02-08 MED ORDER — ALPRAZOLAM 0.25 MG PO TABS: 0.2500 mg | ORAL_TABLET | Freq: Two times a day (BID) | ORAL | 0 refills | Status: DC | PRN

## 2018-02-08 NOTE — Telephone Encounter (Signed)
Daughter called reporting that they were told to call if her anxiety got worse and she is asking for medicine to be call in for it. Please advise

## 2018-02-08 NOTE — Telephone Encounter (Signed)
0.25 xanax BID PRN.

## 2018-02-11 ENCOUNTER — Encounter: Payer: Self-pay | Admitting: Obstetrics and Gynecology

## 2018-02-18 ENCOUNTER — Other Ambulatory Visit: Payer: Self-pay | Admitting: *Deleted

## 2018-02-18 MED ORDER — ALPRAZOLAM 0.25 MG PO TABS: 0.2500 mg | ORAL_TABLET | Freq: Two times a day (BID) | ORAL | 0 refills | Status: DC | PRN

## 2018-02-18 NOTE — Telephone Encounter (Signed)
Elizabeth Davenport needs prescription faxed to them and never received e scribed rx

## 2018-02-25 NOTE — Progress Notes (Signed)
[name]  [phone] [phone]) [phone]  ID: Elizabeth Davenport OB: [birth year]  MR#: [id number]  LFY#:[id number]  Patient Care Team: Letta Median, MD as PCP - General (Family Medicine) Clent Jacks, RN as Registered Nurse Rico Junker, RN as Registered Nurse Theodore Demark, RN as Registered Nurse  CHIEF COMPLAINT: Progressive low-grade ovarian malignancy  INTERVAL HISTORY: Patient returns to clinic today for further evaluation and consideration of cycle 3 of 3 of adjuvant carboplatinum and Taxol.  She is tolerating her treatments well without significant side effects.  She currently feels well and is asymptomatic. She has no neurologic complaints.  She denies any recent fevers or illnesses.  She has a good appetite and denies weight loss.  She has no chest pain or shortness of breath.  She denies any abdominal pain or bloating.  She denies any nausea, vomiting, constipation, or diarrhea.  She has no urinary complaints.  Patient offers no specific complaints today.  REVIEW OF SYSTEMS:   Review of Systems  Constitutional: Negative.  Negative for fever, malaise/fatigue and weight loss.  Respiratory: Negative.  Negative for cough and shortness of breath.   Cardiovascular: Negative.  Negative for chest pain and leg swelling.  Gastrointestinal: Negative.  Negative for abdominal pain, constipation, diarrhea, nausea and vomiting.  Genitourinary: Negative.  Negative for dysuria.  Musculoskeletal: Negative.  Negative for back pain.  Skin: Negative.  Negative for rash.  Neurological: Negative.  Negative for sensory change, focal weakness and weakness.  Psychiatric/Behavioral: Negative.  The patient is not nervous/anxious.     As per HPI. Otherwise, a complete review of systems is negative.  PAST MEDICAL HISTORY: Past Medical History:  Diagnosis Date  . Arthritis    left knee  . Asthma   . Endometrial cancer ([place]) 11/04/14  . PONV  (postoperative nausea and vomiting)     PAST SURGICAL HISTORY: Past Surgical History:  Procedure Laterality Date  . ABDOMINAL HYSTERECTOMY    . LAPAROSCOPIC BILATERAL SALPINGO OOPHERECTOMY Bilateral 11/04/2014   Procedure: LAPAROSCOPIC BILATERAL SALPINGO OOPHORECTOMY;  Surgeon: Mellody Drown, MD;  Location: [facility] ORS;  Service: Gynecology;  Laterality: Bilateral;  . LAPAROSCOPIC HYSTERECTOMY N/A 11/04/2014   Procedure: HYSTERECTOMY TOTAL LAPAROSCOPIC;  Surgeon: Mellody Drown, MD;  Location: [facility] ORS;  Service: Gynecology;  Laterality: N/A;  . removed mass from thyriod      FAMILY HISTORY: Family History  Problem Relation Age of Onset  . Breast cancer Sister 27  . Asthma Mother   . Coronary artery disease Mother   . Diabetes Brother   . Diabetes Sister   . Diabetes Sister   . Diabetes Brother     ADVANCED DIRECTIVES (Y/N):  N  HEALTH MAINTENANCE: Social History   Tobacco Use  . Smoking status: Former Smoker    Packs/day: 0.25    Years: 8.00    Pack years: 2.00    Types: Cigarettes    Last attempt to quit: 06/29/1990    Years since quitting: 27.6  . Smokeless tobacco: Never Used  . Tobacco comment: Smoking History Cigarettes 1 cig per day; quit 22 years ago; smoked for 8 years  Substance Use Topics  . Alcohol use: No    Alcohol/week: 0.0 standard drinks    Comment: occasional   . Drug use: No     Colonoscopy:  PAP:  Bone density:  Lipid panel:  No Known Allergies  Current Outpatient Medications  Medication Sig Dispense Refill  . albuterol (PROVENTIL HFA;VENTOLIN HFA) 108 (90 BASE) MCG/ACT  inhaler Inhale 2 puffs into the lungs every 6 (six) hours as needed for wheezing or shortness of breath.    . ALPRAZolam (XANAX) 0.25 MG tablet Take 1 tablet (0.25 mg total) by mouth 2 (two) times daily as needed for anxiety. 60 tablet 0  . fluticasone (FLOVENT HFA) 220 MCG/ACT inhaler Take by mouth.    . ondansetron (ZOFRAN ODT) 4 MG disintegrating tablet Take 1 tablet (4 mg  total) by mouth every 8 (eight) hours as needed for nausea or vomiting. 20 tablet 0  . HYDROmorphone (DILAUDID) 2 MG tablet Take by mouth.    . ondansetron (ZOFRAN) 8 MG tablet Take 1 tablet (8 mg total) by mouth 2 (two) times daily as needed for refractory nausea / vomiting. (Patient not taking: Reported on 02/26/2018) 30 tablet 2  . oxyCODONE-acetaminophen (PERCOCET) 5-325 MG tablet Take 1 tablet by mouth every 4 (four) hours as needed for severe pain. (Patient not taking: Reported on 02/26/2018) 20 tablet 0  . prochlorperazine (COMPAZINE) 10 MG tablet Take 1 tablet (10 mg total) by mouth every 6 (six) hours as needed (Nausea or vomiting). (Patient not taking: Reported on 02/26/2018) 60 tablet 2   No current facility-administered medications for this visit.    Facility-Administered Medications Ordered in Other Visits  Medication Dose Route Frequency Provider Last Rate Last Dose  . CARBOplatin (PARAPLATIN) 750 mg in sodium chloride 0.9 % 250 mL chemo infusion  750 mg Intravenous Once Lloyd Huger, MD      . PACLitaxel (TAXOL) 342 mg in sodium chloride 0.9 % 500 mL chemo infusion (> 80mg /m2)  175 mg/m2 (Treatment Plan Recorded) Intravenous Once Lloyd Huger, MD 186 mL/hr at 02/26/18 1010 342 mg at 02/26/18 1010    OBJECTIVE: Vitals:   02/26/18 0853  BP: 138/85  Pulse: 84  Resp: 18  Temp: (!) 97.1 F (36.2 C)     Body mass index is 38.18 kg/m.    ECOG FS:0 - Asymptomatic  General: Well-developed, well-nourished, no acute distress. Eyes: Pink conjunctiva, anicteric sclera. HEENT: Normocephalic, moist mucous membranes. Lungs: Clear to auscultation bilaterally. Heart: Regular rate and rhythm. No rubs, murmurs, or gallops. Abdomen: Soft, nontender, nondistended. No organomegaly noted, normoactive bowel sounds. Musculoskeletal: No edema, cyanosis, or clubbing. Neuro: Alert, answering all questions appropriately. Cranial nerves grossly intact. Skin: No rashes or petechiae  noted. Psych: Normal affect.   LAB RESULTS:  Lab Results  Component Value Date   NA 138 02/26/2018   K 3.8 02/26/2018   CL 106 02/26/2018   CO2 23 02/26/2018   GLUCOSE 111 (H) 02/26/2018   BUN 14 02/26/2018   CREATININE 0.45 02/26/2018   CALCIUM 8.8 (L) 02/26/2018   PROT 7.4 02/26/2018   ALBUMIN 3.7 02/26/2018   AST 27 02/26/2018   ALT 20 02/26/2018   ALKPHOS 54 02/26/2018   BILITOT 0.5 02/26/2018   GFRNONAA >60 02/26/2018   GFRAA >60 02/26/2018    Lab Results  Component Value Date   WBC 3.7 02/26/2018   NEUTROABS 2.1 02/26/2018   HGB 12.0 02/26/2018   HCT 35.8 02/26/2018   MCV 82.6 02/26/2018   PLT 236 02/26/2018     STUDIES: No results found.  ASSESSMENT: Progressive low-grade ovarian malignancy. PLAN:   1.  Progressive low-grade ovarian malignancy: Case discussed with gynecology oncology.  Patient's most recent Ca-125 has trended down slightly to 314.0, today's result is pending.  Patient previously underwent optimal debulking.  Proceed with cycle 3 of 3 of carboplatinum and Taxol today.  Return to clinic in 3 weeks for further evaluation and initiation of maintenance letrozole.     2. Endometrial cancer: Patient had total hysterectomy.  Imaging as above. 3.  Anemia: Resolved. 4.  Anxiety: Continue Xanax as needed.  I spent a total of 30 minutes face-to-face with the patient of which greater than 50% of the visit was spent in counseling and coordination of care as detailed above.  The entire visit was not done in the presence of interpreter.    Patient expressed understanding and was in agreement with this plan. She also understands that She can call clinic at any time with any questions, concerns, or complaints.   Cancer Staging Borderline epithelial neoplasm of ovary Staging form: Ovary, Fallopian Tube, and Primary Peritoneal Carcinoma, AJCC 8th Edition - Clinical stage from 01/11/2018: FIGO Stage III (cT3, cN0, cM0) - Signed by Lloyd Huger, MD  on 01/11/2018  Endometrial cancer [facility]) Staging form: Corpus Uteri - Carcinoma, AJCC 7th Edition - Clinical: Stage I (T1, N0, M0) - Unsigned  Ovarian cancer, bilateral ([place]) Staging form: Ovary, AJCC 7th Edition - Clinical: Stage Unknown (T1c, NX, M0) - Signed by Forest Gleason, MD on 11/27/2014   Lloyd Huger, MD   02/26/2018 10:48 AM

## 2018-02-26 ENCOUNTER — Inpatient Hospital Stay: Payer: Self-pay

## 2018-02-26 ENCOUNTER — Encounter: Payer: Self-pay | Admitting: Oncology

## 2018-02-26 ENCOUNTER — Inpatient Hospital Stay (HOSPITAL_BASED_OUTPATIENT_CLINIC_OR_DEPARTMENT_OTHER): Payer: Self-pay | Admitting: Oncology

## 2018-02-26 ENCOUNTER — Inpatient Hospital Stay: Payer: Self-pay | Attending: Oncology

## 2018-02-26 VITALS — BP 138/85 | HR 84 | Temp 97.1°F | Resp 18 | Wt 195.5 lb

## 2018-02-26 DIAGNOSIS — Z90710 Acquired absence of both cervix and uterus: Secondary | ICD-10-CM | POA: Insufficient documentation

## 2018-02-26 DIAGNOSIS — D3910 Neoplasm of uncertain behavior of unspecified ovary: Secondary | ICD-10-CM

## 2018-02-26 DIAGNOSIS — Z8542 Personal history of malignant neoplasm of other parts of uterus: Secondary | ICD-10-CM

## 2018-02-26 DIAGNOSIS — D72819 Decreased white blood cell count, unspecified: Secondary | ICD-10-CM | POA: Insufficient documentation

## 2018-02-26 DIAGNOSIS — Z90722 Acquired absence of ovaries, bilateral: Secondary | ICD-10-CM | POA: Insufficient documentation

## 2018-02-26 DIAGNOSIS — C561 Malignant neoplasm of right ovary: Secondary | ICD-10-CM | POA: Insufficient documentation

## 2018-02-26 DIAGNOSIS — Z803 Family history of malignant neoplasm of breast: Secondary | ICD-10-CM | POA: Insufficient documentation

## 2018-02-26 DIAGNOSIS — Z87891 Personal history of nicotine dependence: Secondary | ICD-10-CM | POA: Insufficient documentation

## 2018-02-26 DIAGNOSIS — F419 Anxiety disorder, unspecified: Secondary | ICD-10-CM | POA: Insufficient documentation

## 2018-02-26 DIAGNOSIS — Z5111 Encounter for antineoplastic chemotherapy: Secondary | ICD-10-CM | POA: Insufficient documentation

## 2018-02-26 DIAGNOSIS — D649 Anemia, unspecified: Secondary | ICD-10-CM | POA: Insufficient documentation

## 2018-02-26 DIAGNOSIS — C562 Malignant neoplasm of left ovary: Secondary | ICD-10-CM | POA: Insufficient documentation

## 2018-02-26 DIAGNOSIS — C563 Malignant neoplasm of bilateral ovaries: Secondary | ICD-10-CM

## 2018-02-26 DIAGNOSIS — Z79899 Other long term (current) drug therapy: Secondary | ICD-10-CM | POA: Insufficient documentation

## 2018-02-26 LAB — CBC WITH DIFFERENTIAL/PLATELET
Basophils Absolute: 0 10*3/uL (ref 0–0.1)
Basophils Relative: 0 %
EOS ABS: 0 10*3/uL (ref 0–0.7)
EOS PCT: 1 %
HCT: 35.8 % (ref 35.0–47.0)
Hemoglobin: 12 g/dL (ref 12.0–16.0)
LYMPHS ABS: 1.4 10*3/uL (ref 1.0–3.6)
LYMPHS PCT: 39 %
MCH: 27.6 pg (ref 26.0–34.0)
MCHC: 33.4 g/dL (ref 32.0–36.0)
MCV: 82.6 fL (ref 80.0–100.0)
MONO ABS: 0.2 10*3/uL (ref 0.2–0.9)
MONOS PCT: 5 %
Neutro Abs: 2.1 10*3/uL (ref 1.4–6.5)
Neutrophils Relative %: 55 %
PLATELETS: 236 10*3/uL (ref 150–440)
RBC: 4.34 MIL/uL (ref 3.80–5.20)
RDW: 20.5 % — AB (ref 11.5–14.5)
WBC: 3.7 10*3/uL (ref 3.6–11.0)

## 2018-02-26 LAB — COMPREHENSIVE METABOLIC PANEL
ALBUMIN: 3.7 g/dL (ref 3.5–5.0)
ALK PHOS: 54 U/L (ref 38–126)
ALT: 20 U/L (ref 0–44)
AST: 27 U/L (ref 15–41)
Anion gap: 9 (ref 5–15)
BILIRUBIN TOTAL: 0.5 mg/dL (ref 0.3–1.2)
BUN: 14 mg/dL (ref 6–20)
CALCIUM: 8.8 mg/dL — AB (ref 8.9–10.3)
CO2: 23 mmol/L (ref 22–32)
Chloride: 106 mmol/L (ref 98–111)
Creatinine, Ser: 0.45 mg/dL (ref 0.44–1.00)
GFR calc Af Amer: >60 mL/min (ref >60)
GLUCOSE: 111 mg/dL — AB (ref 70–99)
Potassium: 3.8 mmol/L (ref 3.5–5.1)
Sodium: 138 mmol/L (ref 135–145)
TOTAL PROTEIN: 7.4 g/dL (ref 6.5–8.1)

## 2018-02-26 MED ORDER — DIPHENHYDRAMINE HCL 50 MG/ML IJ SOLN
25.0000 mg | Freq: Once | INTRAMUSCULAR | Status: AC
  Administered 2018-02-26: 25 mg via INTRAVENOUS
  Filled 2018-02-26: qty 1

## 2018-02-26 MED ORDER — SODIUM CHLORIDE 0.9 % IV SOLN: 10.0000 mg | Freq: Once | INTRAVENOUS | Status: DC

## 2018-02-26 MED ORDER — SODIUM CHLORIDE 0.9 % IV SOLN
750.0000 mg | Freq: Once | INTRAVENOUS | Status: AC
  Administered 2018-02-26: 750 mg via INTRAVENOUS
  Filled 2018-02-26: qty 75

## 2018-02-26 MED ORDER — FAMOTIDINE IN NACL 20-0.9 MG/50ML-% IV SOLN
20.0000 mg | Freq: Once | INTRAVENOUS | Status: AC
  Administered 2018-02-26: 20 mg via INTRAVENOUS
  Filled 2018-02-26: qty 50

## 2018-02-26 MED ORDER — SODIUM CHLORIDE 0.9 % IV SOLN
Freq: Once | INTRAVENOUS | Status: AC
  Administered 2018-02-26: 10:00:00 via INTRAVENOUS
  Filled 2018-02-26: qty 250

## 2018-02-26 MED ORDER — SODIUM CHLORIDE 0.9 % IV SOLN
175.0000 mg/m2 | Freq: Once | INTRAVENOUS | Status: AC
  Administered 2018-02-26: 342 mg via INTRAVENOUS
  Filled 2018-02-26: qty 57

## 2018-02-26 MED ORDER — DEXAMETHASONE SODIUM PHOSPHATE 10 MG/ML IJ SOLN
10.0000 mg | Freq: Once | INTRAMUSCULAR | Status: AC
  Administered 2018-02-26: 10 mg via INTRAVENOUS
  Filled 2018-02-26: qty 1

## 2018-02-26 MED ORDER — PALONOSETRON HCL INJECTION 0.25 MG/5ML
0.2500 mg | Freq: Once | INTRAVENOUS | Status: AC
  Administered 2018-02-26: 0.25 mg via INTRAVENOUS
  Filled 2018-02-26: qty 5

## 2018-02-26 NOTE — Progress Notes (Signed)
Pt in for follow up, denies any difficulties or concerns.  

## 2018-02-27 LAB — CA 125: Cancer Antigen (CA) 125: 75.7 U/mL — ABNORMAL HIGH (ref 0.0–38.1)

## 2018-03-17 NOTE — Progress Notes (Signed)
[place]  [phone] [phone]) [phone]  ID: Elizabeth Davenport OB: 04-May-1956  MR#: [id number]  HYI#:[id number]  Patient Care Team: Letta Median, MD as PCP - General (Family Medicine) Clent Jacks, RN as Registered Nurse Rico Junker, RN as Registered Nurse Theodore Demark, RN as Registered Nurse  CHIEF COMPLAINT: Progressive low-grade ovarian malignancy  INTERVAL HISTORY: Patient returns to clinic today for further evaluation and initiation of maintenance letrozole.  She currently feels well and is asymptomatic. She has no neurologic complaints.  She denies any recent fevers or illnesses.  She has a good appetite and denies weight loss.  She has no chest pain or shortness of breath.  She denies any abdominal pain or bloating.  She denies any nausea, vomiting, constipation, or diarrhea.  She has no urinary complaints.  Patient feels that her baseline offers no specific complaints today.  REVIEW OF SYSTEMS:   Review of Systems  Constitutional: Negative.  Negative for fever, malaise/fatigue and weight loss.  Respiratory: Negative.  Negative for cough and shortness of breath.   Cardiovascular: Negative.  Negative for chest pain and leg swelling.  Gastrointestinal: Negative.  Negative for abdominal pain, constipation, diarrhea, nausea and vomiting.  Genitourinary: Negative.  Negative for dysuria.  Musculoskeletal: Negative.  Negative for back pain.  Skin: Negative.  Negative for rash.  Neurological: Negative.  Negative for sensory change, focal weakness and weakness.  Psychiatric/Behavioral: Negative.  The patient is not nervous/anxious.     As per HPI. Otherwise, a complete review of systems is negative.  PAST MEDICAL HISTORY: Past Medical History:  Diagnosis Date  . Arthritis    left knee  . Asthma   . Endometrial cancer ([place]) 11/04/14  . PONV (postoperative nausea and vomiting)     PAST SURGICAL HISTORY: Past Surgical History:    Procedure Laterality Date  . ABDOMINAL HYSTERECTOMY    . LAPAROSCOPIC BILATERAL SALPINGO OOPHERECTOMY Bilateral 11/04/2014   Procedure: LAPAROSCOPIC BILATERAL SALPINGO OOPHORECTOMY;  Surgeon: Mellody Drown, MD;  Location: [facility] ORS;  Service: Gynecology;  Laterality: Bilateral;  . LAPAROSCOPIC HYSTERECTOMY N/A 11/04/2014   Procedure: HYSTERECTOMY TOTAL LAPAROSCOPIC;  Surgeon: Mellody Drown, MD;  Location: [facility] ORS;  Service: Gynecology;  Laterality: N/A;  . removed mass from thyriod      FAMILY HISTORY: Family History  Problem Relation Age of Onset  . Breast cancer Sister 57  . Asthma Mother   . Coronary artery disease Mother   . Diabetes Brother   . Diabetes Sister   . Diabetes Sister   . Diabetes Brother     ADVANCED DIRECTIVES (Y/N):  N  HEALTH MAINTENANCE: Social History   Tobacco Use  . Smoking status: Former Smoker    Packs/day: 0.25    Years: 8.00    Pack years: 2.00    Types: Cigarettes    Last attempt to quit: 06/29/1990    Years since quitting: 27.7  . Smokeless tobacco: Never Used  . Tobacco comment: Smoking History Cigarettes 1 cig per day; quit 22 years ago; smoked for 8 years  Substance Use Topics  . Alcohol use: No    Alcohol/week: 0.0 standard drinks    Comment: occasional   . Drug use: No     Colonoscopy:  PAP:  Bone density:  Lipid panel:  No Known Allergies  Current Outpatient Medications  Medication Sig Dispense Refill  . albuterol (PROVENTIL HFA;VENTOLIN HFA) 108 (90 BASE) MCG/ACT inhaler Inhale 2 puffs into the lungs every 6 (six) hours as needed  for wheezing or shortness of breath.    . ALPRAZolam (XANAX) 0.25 MG tablet Take 1 tablet (0.25 mg total) by mouth 2 (two) times daily as needed for anxiety. (Patient not taking: Reported on 03/19/2018) 60 tablet 0  . fluticasone (FLOVENT HFA) 220 MCG/ACT inhaler Take by mouth.    Marland Kitchen HYDROmorphone (DILAUDID) 2 MG tablet Take by mouth.    . letrozole (FEMARA) 2.5 MG tablet Take 1 tablet (2.5 mg  total) by mouth daily. 30 tablet 3  . ondansetron (ZOFRAN ODT) 4 MG disintegrating tablet Take 1 tablet (4 mg total) by mouth every 8 (eight) hours as needed for nausea or vomiting. (Patient not taking: Reported on 03/19/2018) 20 tablet 0  . ondansetron (ZOFRAN) 8 MG tablet Take 1 tablet (8 mg total) by mouth 2 (two) times daily as needed for refractory nausea / vomiting. (Patient not taking: Reported on 03/19/2018) 30 tablet 2  . oxyCODONE-acetaminophen (PERCOCET) 5-325 MG tablet Take 1 tablet by mouth every 4 (four) hours as needed for severe pain. (Patient not taking: Reported on 03/19/2018) 20 tablet 0  . prochlorperazine (COMPAZINE) 10 MG tablet Take 1 tablet (10 mg total) by mouth every 6 (six) hours as needed (Nausea or vomiting). (Patient not taking: Reported on 03/19/2018) 60 tablet 2   No current facility-administered medications for this visit.     OBJECTIVE: Vitals:   03/19/18 1025  BP: (!) 148/92  Pulse: 66  Temp: 97.7 F (36.5 C)     Body mass index is 37.91 kg/m.    ECOG FS:0 - Asymptomatic  General: Well-developed, well-nourished, no acute distress. Eyes: Pink conjunctiva, anicteric sclera. HEENT: Normocephalic, moist mucous membranes. Lungs: Clear to auscultation bilaterally. Heart: Regular rate and rhythm. No rubs, murmurs, or gallops. Abdomen: Soft, nontender, nondistended. No organomegaly noted, normoactive bowel sounds. Musculoskeletal: No edema, cyanosis, or clubbing. Neuro: Alert, answering all questions appropriately. Cranial nerves grossly intact. Skin: No rashes or petechiae noted. Psych: Normal affect.  LAB RESULTS:  Lab Results  Component Value Date   NA 139 03/19/2018   K 4.5 03/19/2018   CL 106 03/19/2018   CO2 25 03/19/2018   GLUCOSE 97 03/19/2018   BUN 9 03/19/2018   CREATININE 0.45 03/19/2018   CALCIUM 9.2 03/19/2018   PROT 7.4 03/19/2018   ALBUMIN 4.1 03/19/2018   AST 24 03/19/2018   ALT 23 03/19/2018   ALKPHOS 54 03/19/2018   BILITOT 0.6  03/19/2018   GFRNONAA >60 03/19/2018   GFRAA >60 03/19/2018    Lab Results  Component Value Date   WBC 3.4 (L) 03/19/2018   NEUTROABS 1.6 03/19/2018   HGB 11.7 (L) 03/19/2018   HCT 35.0 03/19/2018   MCV 85.4 03/19/2018   PLT 210 03/19/2018     STUDIES: No results found.  ASSESSMENT: Progressive low-grade ovarian malignancy. PLAN:   1.  Progressive low-grade ovarian malignancy: Case discussed with gynecology oncology.  Patient previously underwent optimal surgical debulking and then completed 3 cycles of adjuvant carboplatinum and Taxol on February 26, 2018.  Her CA-125 has trended down significantly and is now within normal limits at 22.1.  Proceed with maintenance letrozole today.  We will get a baseline bone mineral density in the next 1 to 2 weeks.  Return to clinic in 6 weeks for further evaluation.    2.  History of endometrial cancer: Patient had total hysterectomy.  Imaging as above. 3.  Anemia: Mild, patient's hemoglobin is 11.7.  Monitor. 4.  Leukopenia: Mild, monitor. 5.  Anxiety: Continue Xanax  as needed.  The entire visit was not done in the presence of interpreter.    Patient expressed understanding and was in agreement with this plan. She also understands that She can call clinic at any time with any questions, concerns, or complaints.   Cancer Staging Borderline epithelial neoplasm of ovary Staging form: Ovary, Fallopian Tube, and Primary Peritoneal Carcinoma, AJCC 8th Edition - Clinical stage from 01/11/2018: FIGO Stage III (cT3, cN0, cM0) - Signed by Lloyd Huger, MD on 01/11/2018  Endometrial cancer [facility]) Staging form: Corpus Uteri - Carcinoma, AJCC 7th Edition - Clinical: Stage I (T1, N0, M0) - Unsigned  Ovarian carcinoma, unspecified laterality ([place]) Staging form: Ovary, AJCC 7th Edition - Clinical: Stage Unknown (T1c, NX, M0) - Signed by Forest Gleason, MD on 11/27/2014   Lloyd Huger, MD   03/23/2018 6:07 AM

## 2018-03-19 ENCOUNTER — Other Ambulatory Visit: Payer: Self-pay

## 2018-03-19 ENCOUNTER — Encounter: Payer: Self-pay | Admitting: Oncology

## 2018-03-19 ENCOUNTER — Inpatient Hospital Stay: Payer: Self-pay

## 2018-03-19 ENCOUNTER — Inpatient Hospital Stay (HOSPITAL_BASED_OUTPATIENT_CLINIC_OR_DEPARTMENT_OTHER): Payer: Self-pay | Admitting: Oncology

## 2018-03-19 VITALS — BP 148/92 | HR 66 | Temp 97.7°F | Wt 194.1 lb

## 2018-03-19 DIAGNOSIS — D72819 Decreased white blood cell count, unspecified: Secondary | ICD-10-CM

## 2018-03-19 DIAGNOSIS — Z803 Family history of malignant neoplasm of breast: Secondary | ICD-10-CM

## 2018-03-19 DIAGNOSIS — Z90722 Acquired absence of ovaries, bilateral: Secondary | ICD-10-CM

## 2018-03-19 DIAGNOSIS — Z8542 Personal history of malignant neoplasm of other parts of uterus: Secondary | ICD-10-CM

## 2018-03-19 DIAGNOSIS — F419 Anxiety disorder, unspecified: Secondary | ICD-10-CM

## 2018-03-19 DIAGNOSIS — D3910 Neoplasm of uncertain behavior of unspecified ovary: Secondary | ICD-10-CM

## 2018-03-19 DIAGNOSIS — Z90710 Acquired absence of both cervix and uterus: Secondary | ICD-10-CM

## 2018-03-19 DIAGNOSIS — C561 Malignant neoplasm of right ovary: Secondary | ICD-10-CM

## 2018-03-19 DIAGNOSIS — Z79899 Other long term (current) drug therapy: Secondary | ICD-10-CM

## 2018-03-19 DIAGNOSIS — D649 Anemia, unspecified: Secondary | ICD-10-CM

## 2018-03-19 DIAGNOSIS — Z5111 Encounter for antineoplastic chemotherapy: Secondary | ICD-10-CM

## 2018-03-19 DIAGNOSIS — Z87891 Personal history of nicotine dependence: Secondary | ICD-10-CM

## 2018-03-19 DIAGNOSIS — C562 Malignant neoplasm of left ovary: Secondary | ICD-10-CM

## 2018-03-19 LAB — COMPREHENSIVE METABOLIC PANEL
ALK PHOS: 54 U/L (ref 38–126)
ALT: 23 U/L (ref 0–44)
AST: 24 U/L (ref 15–41)
Albumin: 4.1 g/dL (ref 3.5–5.0)
Anion gap: 8 (ref 5–15)
BILIRUBIN TOTAL: 0.6 mg/dL (ref 0.3–1.2)
BUN: 9 mg/dL (ref 6–20)
CALCIUM: 9.2 mg/dL (ref 8.9–10.3)
CO2: 25 mmol/L (ref 22–32)
Chloride: 106 mmol/L (ref 98–111)
Creatinine, Ser: 0.45 mg/dL (ref 0.44–1.00)
GFR calc Af Amer: >60 mL/min (ref >60)
GFR calc non Af Amer: >60 mL/min (ref >60)
Glucose, Bld: 97 mg/dL (ref 70–99)
Potassium: 4.5 mmol/L (ref 3.5–5.1)
SODIUM: 139 mmol/L (ref 135–145)
TOTAL PROTEIN: 7.4 g/dL (ref 6.5–8.1)

## 2018-03-19 LAB — CBC WITH DIFFERENTIAL/PLATELET
BASOS ABS: 0 10*3/uL (ref 0–0.1)
BASOS PCT: 0 %
Eosinophils Absolute: 0 10*3/uL (ref 0–0.7)
Eosinophils Relative: 1 %
HEMATOCRIT: 35 % (ref 35.0–47.0)
HEMOGLOBIN: 11.7 g/dL — AB (ref 12.0–16.0)
Lymphocytes Relative: 45 %
Lymphs Abs: 1.5 10*3/uL (ref 1.0–3.6)
MCH: 28.6 pg (ref 26.0–34.0)
MCHC: 33.5 g/dL (ref 32.0–36.0)
MCV: 85.4 fL (ref 80.0–100.0)
Monocytes Absolute: 0.3 10*3/uL (ref 0.2–0.9)
Monocytes Relative: 8 %
NEUTROS ABS: 1.6 10*3/uL (ref 1.4–6.5)
NEUTROS PCT: 46 %
Platelets: 210 10*3/uL (ref 150–440)
RBC: 4.1 MIL/uL (ref 3.80–5.20)
RDW: 21.9 % — ABNORMAL HIGH (ref 11.5–14.5)
WBC: 3.4 10*3/uL — AB (ref 3.6–11.0)

## 2018-03-19 MED ORDER — LETROZOLE 2.5 MG PO TABS: 2.5000 mg | ORAL_TABLET | Freq: Every day | ORAL | 3 refills | Status: DC

## 2018-03-20 LAB — CA 125: Cancer Antigen (CA) 125: 22.1 U/mL (ref 0.0–38.1)

## 2018-03-27 ENCOUNTER — Ambulatory Visit
Admission: RE | Admit: 2018-03-27 | Discharge: 2018-03-27 | Disposition: A | Discharge status: Home / Self Care | Payer: Self-pay | Source: Ambulatory Visit | Attending: Oncology | Admitting: Oncology

## 2018-03-27 DIAGNOSIS — D3910 Neoplasm of uncertain behavior of unspecified ovary: Secondary | ICD-10-CM | POA: Insufficient documentation

## 2018-04-01 ENCOUNTER — Telehealth: Payer: Self-pay

## 2018-04-01 NOTE — Telephone Encounter (Signed)
Received call from daughter, [place]. She reports that Elizabeth Davenport is complaining of all over bone pain and low back pain after starting Letrozole. She is refusing to take any more. Spoke with Dr. Grayland Ormond and he is going to start her on Anastrozole. He would like for her to start this new medication one month after her last dose of Letrozole. She verbalized understanding. She is to keep her previously scheduled appointment with Dr. Grayland Ormond. Oncology Nurse Navigator Documentation  Navigator Location: [facility] (04/01/18 1500)   )Navigator Encounter Type: Telephone (04/01/18 1500) Telephone: Incoming Call;Outgoing Call;Education;Symptom Mgt (04/01/18 1500)                                                  Time Spent with Patient: 15 (04/01/18 1500)

## 2018-04-05 ENCOUNTER — Other Ambulatory Visit: Payer: Self-pay

## 2018-04-05 ENCOUNTER — Emergency Department: Payer: Self-pay

## 2018-04-05 ENCOUNTER — Emergency Department
Admission: EM | Admit: 2018-04-05 | Discharge: 2018-04-05 | Disposition: A | Discharge status: Home / Self Care | Payer: Self-pay | Attending: Emergency Medicine | Admitting: Emergency Medicine

## 2018-04-05 DIAGNOSIS — R109 Unspecified abdominal pain: Secondary | ICD-10-CM

## 2018-04-05 DIAGNOSIS — R8271 Bacteriuria: Secondary | ICD-10-CM | POA: Insufficient documentation

## 2018-04-05 DIAGNOSIS — Z79899 Other long term (current) drug therapy: Secondary | ICD-10-CM | POA: Insufficient documentation

## 2018-04-05 DIAGNOSIS — N3000 Acute cystitis without hematuria: Secondary | ICD-10-CM | POA: Insufficient documentation

## 2018-04-05 DIAGNOSIS — J45909 Unspecified asthma, uncomplicated: Secondary | ICD-10-CM | POA: Insufficient documentation

## 2018-04-05 DIAGNOSIS — R1032 Left lower quadrant pain: Secondary | ICD-10-CM

## 2018-04-05 DIAGNOSIS — Z87891 Personal history of nicotine dependence: Secondary | ICD-10-CM | POA: Insufficient documentation

## 2018-04-05 LAB — CBC
HEMATOCRIT: 32.1 % — AB (ref 36.0–46.0)
HEMOGLOBIN: 10.6 g/dL — AB (ref 12.0–15.0)
MCH: 28.9 pg (ref 26.0–34.0)
MCHC: 33 g/dL (ref 30.0–36.0)
MCV: 87.5 fL (ref 80.0–100.0)
Platelets: 260 10*3/uL (ref 150–400)
RBC: 3.67 MIL/uL — AB (ref 3.87–5.11)
RDW: 19.3 % — ABNORMAL HIGH (ref 11.5–15.5)
WBC: 6.1 10*3/uL (ref 4.0–10.5)
nRBC: 0 % (ref 0.0–0.2)

## 2018-04-05 LAB — COMPREHENSIVE METABOLIC PANEL
ALBUMIN: 3.5 g/dL (ref 3.5–5.0)
ALT: 16 U/L (ref 0–44)
ANION GAP: 6 (ref 5–15)
AST: 20 U/L (ref 15–41)
Alkaline Phosphatase: 53 U/L (ref 38–126)
BUN: 11 mg/dL (ref 6–20)
CHLORIDE: 106 mmol/L (ref 98–111)
CO2: 25 mmol/L (ref 22–32)
Calcium: 8.3 mg/dL — ABNORMAL LOW (ref 8.9–10.3)
Creatinine, Ser: 0.43 mg/dL — ABNORMAL LOW (ref 0.44–1.00)
GFR calc Af Amer: >60 mL/min (ref >60)
GFR calc non Af Amer: >60 mL/min (ref >60)
GLUCOSE: 114 mg/dL — AB (ref 70–99)
POTASSIUM: 3.8 mmol/L (ref 3.5–5.1)
SODIUM: 137 mmol/L (ref 135–145)
Total Bilirubin: 0.5 mg/dL (ref 0.3–1.2)
Total Protein: 7 g/dL (ref 6.5–8.1)

## 2018-04-05 LAB — URINALYSIS, COMPLETE (UACMP) WITH MICROSCOPIC
Bilirubin Urine: NEGATIVE
Glucose, UA: NEGATIVE mg/dL
HGB URINE DIPSTICK: NEGATIVE
Ketones, ur: NEGATIVE mg/dL
Leukocytes, UA: NEGATIVE
Nitrite: NEGATIVE
PROTEIN: NEGATIVE mg/dL
Specific Gravity, Urine: 1.002 — ABNORMAL LOW (ref 1.005–1.030)
pH: 8 (ref 5.0–8.0)

## 2018-04-05 LAB — LIPASE, BLOOD: Lipase: 27 U/L (ref 11–51)

## 2018-04-05 MED ORDER — SODIUM CHLORIDE 0.9 % IV BOLUS
1000.0000 mL | Freq: Once | INTRAVENOUS | Status: AC
  Administered 2018-04-05: 1000 mL via INTRAVENOUS

## 2018-04-05 MED ORDER — KETOROLAC TROMETHAMINE 30 MG/ML IJ SOLN
30.0000 mg | Freq: Once | INTRAMUSCULAR | Status: AC
  Administered 2018-04-05: 30 mg via INTRAVENOUS
  Filled 2018-04-05: qty 1

## 2018-04-05 MED ORDER — SODIUM CHLORIDE 0.9 % IV SOLN
1.0000 g | Freq: Once | INTRAVENOUS | Status: AC
  Administered 2018-04-05: 1 g via INTRAVENOUS
  Filled 2018-04-05: qty 10

## 2018-04-05 MED ORDER — CEPHALEXIN 500 MG PO CAPS: 500.0000 mg | ORAL_CAPSULE | Freq: Four times a day (QID) | ORAL | 0 refills | Status: AC

## 2018-04-05 NOTE — ED Provider Notes (Signed)
[facility] Emergency Department Provider Note  ____________________________________________  Time seen: Approximately 10:14 PM  I have reviewed the triage vital signs and the nursing notes.   HISTORY  Chief Complaint Abdominal Pain and Back Pain    HPI Elizabeth Davenport is a 62 y.o. female with a history of ovarian cancer status post oophorectomy and hysterectomy, last chemo 02/26/2018, history of endometrial cancer, presenting for left flank pain and left lower quadrant pain.  The patient reports that for the past 3 days she has had a progressively worsening left flank pain that radiates to the left lower quadrant without any nausea vomiting or diarrhea.  She has not had any change in her vaginal discharge.  She denies any pain or burning with urination, urinary frequency, or hematuria.  She does occasionally have the sensation of incomplete voiding.  She has not had any fevers or chills.  She has tried Tylenol for pain with some improvement.  No trauma or strain.   Past Medical History:  Diagnosis Date  . Arthritis    left knee  . Asthma   . Endometrial cancer ([place]) 11/04/14  . PONV (postoperative nausea and vomiting)     Patient Active Problem List   Diagnosis Date Noted  . Refusal of blood transfusions as patient is Jehovah's Witness 11/30/2017  . 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 History of endometrial cancer 11/08/2016  . 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 Ovarian carcinoma, unspecified laterality ([place]) 11/25/2014  . Endometrial cancer ([place]) 11/04/2014  . S/P laparoscopic hysterectomy 11/04/2014    Past Surgical History:  Procedure Laterality Date  . ABDOMINAL HYSTERECTOMY    . LAPAROSCOPIC BILATERAL SALPINGO OOPHERECTOMY Bilateral 11/04/2014   Procedure: LAPAROSCOPIC BILATERAL SALPINGO OOPHORECTOMY;  Surgeon: Mellody Drown,  MD;  Location: [facility] ORS;  Service: Gynecology;  Laterality: Bilateral;  . LAPAROSCOPIC HYSTERECTOMY N/A 11/04/2014   Procedure: HYSTERECTOMY TOTAL LAPAROSCOPIC;  Surgeon: Mellody Drown, MD;  Location: [facility] ORS;  Service: Gynecology;  Laterality: N/A;  . removed mass from thyriod      Current Outpatient Rx  . Order #: [id number] Class: Historical Med  . Order #: [id number] Class: Print  . Order #: [id number] Class: Print  . Order #: [id number] Class: Historical Med  . Order #: [id number] Class: Historical Med  . Order #: [id number] Class: Normal  . Order #: [id number] Class: Print  . Order #: [id number] Class: Normal  . Order #: [id number] Class: Print  . Order #: [id number] Class: Normal    Allergies Patient has no known allergies.  Family History  Problem Relation Age of Onset  . Breast cancer Sister 5  . Asthma Mother   . Coronary artery disease Mother   . Diabetes Brother   . Diabetes Sister   . Diabetes Sister   . Diabetes Brother     Social History Social History   Tobacco Use  . Smoking status: Former Smoker    Packs/day: 0.25    Years: 8.00    Pack years: 2.00    Types: Cigarettes    Last attempt to quit: 06/29/1990    Years since quitting: 27.7  . Smokeless tobacco: Never Used  . Tobacco comment: Smoking History Cigarettes 1 cig per day; quit 22 years ago; smoked for 8 years  Substance Use Topics  . Alcohol use: No    Alcohol/week: 0.0 standard drinks    Comment: occasional   . Drug use: No    Review of Systems Constitutional:  No fever/chills.  No lightheadedness or syncope. Eyes: No visual changes. ENT: No sore throat. No congestion or rhinorrhea. Cardiovascular: Denies chest pain. Denies palpitations. Respiratory: Denies shortness of breath.  No cough. Gastrointestinal: As of left flank and left lower quadrant abdominal pain.  No nausea, no vomiting.  No diarrhea.  No constipation. Genitourinary: Negative for dysuria.  No urinary frequency.  Positive incomplete  voiding sensation.  No change in vaginal discharge. Musculoskeletal: Negative for back pain midline. Skin: Negative for rash. Neurological: Negative for headaches. No focal numbness, tingling or weakness.     ____________________________________________   PHYSICAL EXAM:  VITAL SIGNS: ED Triage Vitals  Enc Vitals Group     BP 04/05/18 2104 130/75     Pulse Rate 04/05/18 2104 95     Resp 04/05/18 2104 18     Temp 04/05/18 2104 98.8 F (37.1 C)     Temp Source 04/05/18 2104 Oral     SpO2 04/05/18 2104 96 %     Weight 04/05/18 2105 196 lb (88.9 kg)     Height 04/05/18 2105 5\' 1"  (1.549 m)     Head Circumference --      Peak Flow --      Pain Score 04/05/18 2105 7     Pain Loc --      Pain Edu? --      Excl. in [place]? --     Constitutional: Alert and oriented.Answers questions appropriately.  Chronically ill-appearing. Eyes: Conjunctivae are normal.  EOMI. No scleral icterus. Head: Atraumatic. Nose: No congestion/rhinnorhea. Mouth/Throat: Mucous membranes are moist.  Neck: No stridor.  Supple.  No meningismus. Cardiovascular: Normal rate, regular rhythm. No murmurs, rubs or gallops.  Respiratory: Normal respiratory effort.  No accessory muscle use or retractions. Lungs CTAB.  No wheezes, rales or ronchi. Gastrointestinal: Soft, and nondistended.  Positive left CVA tenderness to palpation with mild left lower quadrant tenderness.  No guarding or rebound.  No peritoneal signs. Musculoskeletal: No LE edema.  Neurologic:  A&Ox3.  Speech is clear.  Face and smile are symmetric.  EOMI.  Moves all extremities well. Skin:  Skin is warm, dry and intact. No rash noted. Psychiatric: Mood and affect are normal. Speech and behavior are normal.  Normal judgement.  ____________________________________________   LABS (all labs ordered are listed, but only abnormal results are displayed)  Labs Reviewed  COMPREHENSIVE METABOLIC PANEL - Abnormal; Notable for the following components:       Result Value   Glucose, Bld 114 (*)    Creatinine, Ser 0.43 (*)    Calcium 8.3 (*)    All other components within normal limits  CBC - Abnormal; Notable for the following components:   RBC 3.67 (*)    Hemoglobin 10.6 (*)    HCT 32.1 (*)    RDW 19.3 (*)    All other components within normal limits  URINALYSIS, COMPLETE (UACMP) WITH MICROSCOPIC - Abnormal; Notable for the following components:   Color, Urine YELLOW (*)    APPearance HAZY (*)    Specific Gravity, Urine 1.002 (*)    Bacteria, UA RARE (*)    All other components within normal limits  URINE CULTURE  LIPASE, BLOOD   ____________________________________________  EKG  ED ECG REPORT I, Anne-Caroline Mariea Clonts, the attending physician, personally viewed and interpreted this ECG.   Date: 04/05/2018  EKG Time: 2102  Rate: 92  Rhythm: normal sinus rhythm  Axis: normal  Intervals:none  ST&T Change: No STEMI  ____________________________________________  RADIOLOGY  Ct Renal Stone Study  Result Date: 04/05/2018 CLINICAL DATA:  Abdominal bloating and bilateral low back pain. Frequent urination. EXAM: CT ABDOMEN AND PELVIS WITHOUT CONTRAST TECHNIQUE: Multidetector CT imaging of the abdomen and pelvis was performed following the standard protocol without IV contrast. COMPARISON:  10/31/2017 FINDINGS: Lower chest: Lung bases are clear. Prominence of retrocrural and right cardiophrenic angle lymph nodes, similar to prior study. Possibly metastatic. Hepatobiliary: Scattered subcentimeter low-attenuation lesions are too small to characterize but probably represent cysts. Gallbladder and bile ducts are unremarkable. Pancreas: Unremarkable. No pancreatic ductal dilatation or surrounding inflammatory changes. Spleen: Normal in size without focal abnormality. Adrenals/Urinary Tract: No adrenal gland nodules. Midpole cyst in the right kidney is unchanged. No hydronephrosis or hydroureter. No renal, ureteral, or bladder stones. No bladder  wall thickening. Stomach/Bowel: Stomach, small bowel, and colon are not abnormally distended and are mostly decompressed. Scattered stool throughout the colon. No wall thickening is appreciated although lack of distention limits evaluation of the wall. Diverticula in the sigmoid colon without evidence of diverticulitis. Appendix is normal. Vascular/Lymphatic: Scattered aortic calcification. No aortic aneurysm. Retroperitoneal lymph nodes are not pathologically enlarged. Reproductive: Status post hysterectomy. No adnexal masses. Other: Diffuse nodular infiltration throughout the mesentery and omentum consistent with peritoneal carcinomatosis. There is free fluid with mildly increased density in the pelvis, increasing since the previous study. This likely represents free fluid related to metastatic disease, possibly with hemorrhage from endometrial implants. No loculated collections. Musculoskeletal: No destructive bone lesions. IMPRESSION: 1. No renal or ureteral stone or obstruction. 2. No evidence of bowel obstruction or inflammation. 3. Diffuse nodular infiltration throughout the mesentery and omentum consistent with peritoneal carcinomatosis. Increased density in the pelvis likely represents free fluid related to metastatic disease, possibly with hemorrhage or proteinaceous material. 4. Prominence of retrocrural and right cardiophrenic angle lymph nodes, similar to prior study, likely metastatic. 5. Probable hepatic cysts. Aortic Atherosclerosis (ICD10-I70.0). These results were called by telephone at the time of interpretation on 04/05/2018 at 11:05 pm to Dr. Eula Listen , who verbally acknowledged these results. Electronically Signed   By: Lucienne Capers M.D.   On: 04/05/2018 23:07    ____________________________________________   PROCEDURES  Procedure(s) performed: None  Procedures  Critical Care performed: No ____________________________________________   INITIAL IMPRESSION /  ASSESSMENT AND PLAN / ED COURSE  Pertinent labs & imaging results that were available during my care of the patient were reviewed by me and considered in my medical decision making (see chart for details).  62 y.o. female with a history of endometrial and ovarian cancer presenting with left flank and left lower quadrant pain with a sensation of incomplete urinary voiding.  Overall, the patient is hemodynamically stable and afebrile.  It is possible that she has renal colic although she has never had this in the past.  A CT has been ordered.  She does have some bacteriuria without any other signs or symptoms of urinary tract infection and her UA, but I will cover her with ceftriaxone here, and send for a urinary culture.  The patient will be given Toradol and intravenous fluids for her discomfort.  Plan reevaluation for final disposition.  ----------------------------------------- 11:23 PM on 04/05/2018 -----------------------------------------  At this time, the patient is feeling significantly better and is receiving intravenous antibiotics for her bacteriuria.  I spoke directly to the radiologist tonight about the results of her CT scan, which overall seems to be mostly stable from her prior examinations.  She may have slightly worsening carcinomatosis load, and  some free fluid in the pelvis which is likely proteinaceous from her cancer although hemorrhage is not completely excluded.  Clinically, the patient does not have a significant amount of pain in the abdomen, and is hemodynamically stable and her blood counts are generally at her baseline, so acute or chronic bleeding is much less likely.  I did have a long discussion with the patient about this possibility and gave her strict red flag return precautions.  At this time, the patient is safe for discharge home.  Return precautions as well as follow-up instructions were discussed.  ____________________________________________  FINAL CLINICAL  IMPRESSION(S) / ED DIAGNOSES  Final diagnoses:  Acute cystitis without hematuria  Left flank pain  Left lower quadrant pain  Bacteriuria         NEW MEDICATIONS STARTED DURING THIS VISIT:  New Prescriptions   CEPHALEXIN (KEFLEX) 500 MG CAPSULE    Take 1 capsule (500 mg total) by mouth 4 (four) times daily for 5 days.      Eula Listen, MD 04/05/18 2328

## 2018-04-05 NOTE — Discharge Instructions (Addendum)
These take the entire course of antibiotics, even if you are feeling better.  Your primary care physician can follow-up the results of your urine culture.  Please take Tylenol or Motrin as needed for pain.  Return to the emergency department if you develop severe pain, lightheadedness or fainting, fever, inability to keep down fluids, or any other symptoms concerning to you.

## 2018-04-05 NOTE — ED Triage Notes (Signed)
Patient to ED for bloated feeling in her abdomen and lower back pain. Frequent urination also reported. Patient's last chemo was 02/26/2018

## 2018-04-05 NOTE — ED Notes (Signed)
Patient transported to CT 

## 2018-04-07 LAB — URINE CULTURE: Culture: NO GROWTH

## 2018-04-09 ENCOUNTER — Telehealth: Payer: Self-pay

## 2018-04-09 NOTE — Progress Notes (Signed)
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  Subjective:   Elizabeth Davenport is a pleasant patient who presents for recurrent low grade serous ovarian cancer.   She completed chemotherapy with Dr. Grayland Ormond with 3 cycles of adjuvant carboplatinum and Taxol on February 26, 2018. She was started letrozole but on 04/01/2018 called the clinic and refused to take more due to bone pain and low back pain. The plan was to "wash out" the drug and then start another anastrozole.  Recently she went to the ER on 04/05/2018 for pain lower back, abdominal bloating, and frequent urination. She was diagnosed with possible UTI and treated with cephalexin. CT was also ordered.   CT scan 04/05/2018  IMPRESSION: 1. No renal or ureteral stone or obstruction. 2. No evidence of bowel obstruction or inflammation. 3. Diffuse nodular infiltration throughout the mesentery and omentum consistent with peritoneal carcinomatosis. Increased density in the pelvis likely represents free fluid related to metastatic disease, possibly with hemorrhage or proteinaceous material. 4. Prominence of retrocrural and right cardiophrenic angle lymph nodes, similar to prior study, likely metastatic. 5. Probable hepatic cysts.  She is also seeing Dr. Grayland Ormond today.        Oncology history: Elizabeth Davenport is a 62 y.o. G2P2 woman who developed light vaginal bleeding in early 2016 after undergoing LMP/menopause in 2014. Dr DeFrancesco did PAP that was normal.  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  CT scan 09/22/14 abd/pelvis negative for metastatic disease.  She was referred to [place] and on exam the cervix was normal.  Slides were reviewed at [facility] and read as probable endometrial primary.  She underwent TLH/BSO Nov 04, 2014 at [facility].  Right ovarian excresence noted at surgery.  Survey of the rest of the abdomen, including the  omentum and right diaphragm was negative.  Further staging not performed due to patient's refusal to accept blood products as she is a Jehovah's witness.  Path at [facility] showed 2.6 cm grade 1 endometrial cancer without LVSI or cervical involvement.  The ovaries both were found to have low grade serous cancers of 7 and 8 mm.  No spread noted, but washings were positive.   In view of the path report suggesting primary endometrial and uterine cancers I has the pathology reviewed at [facility].  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  However, the ovarian excresences that were read as low grade serous carcinoma were interpreted as serous borderline tumors by the [facility] Pathologists (several of them reviewed the case). The recommendation was for surveillance.   CA125 values 8.1 - 05/05/15 12.7 - 11/03/15 16.2 - 05/10/16 15.4 - 11/08/2016 30.6 - 05/23/17 25.2 - 07/09/2017  On 10/31/17 she presented to [facility] ED complaining of left lower quadrant abdominal pain.  10/31/17- CT Abdomen Pelvis W Contrast- 1. Interval development of omental and peritoneal implants/metastatic disease and small amount of ascites. Multiple small nodularities in the left upper quadrant under the left hemidiaphragm (series 2, image 23 consistent with peritoneal implants.2. Mildly rounded nodular densities/lymph nodes at the cardiophrenic angles, new compared to prior CT and concerning for metastatic disease. 3. Sigmoid diverticulosis. No bowel obstruction or active inflammation. Normal appendix.  DIAGNOSIS:  A. OMENTUM, LEFT ANTERIOR ABDOMEN; CT-GUIDED BIOPSY:  - POSITIVE FOR INVOLVEMENT BY EPITHELIAL NEOPLASM OF GYNECOLOGIC ORIGIN,       12/13/2017 she underwent diagnostic laparoscopy, omentectomy for removal of mass.  Pathology Low grade serous carcinoma in a background of implants of serous borderline tumor. Positive cytology "SUSPICIOUS FOR MALIGNANCY". Recommended to start  chemotherapy followed by hormonal therapy which she did under the care of Dr Grayland Ormond.    Problem List: Patient Active Problem List   Diagnosis Date Noted  . Refusal of blood transfusions as patient is Jehovah's Witness 11/30/2017  . 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 History of endometrial cancer 11/08/2016  . Serous cystadenoma with borderline malignant features ([place]) 11/08/2016  . Malignant neoplasm of ovary ([place]) 05/10/2016  . Ovarian carcinoma, unspecified laterality ([place]) 11/25/2014  . Endometrial cancer ([place]) 11/04/2014  . S/P laparoscopic hysterectomy 11/04/2014    Past Medical History: Past Medical History:  Diagnosis Date  . Arthritis    left knee  . Asthma   . Endometrial cancer ([place]) 11/04/14  . PONV (postoperative nausea and vomiting)     Past Surgical History: Past Surgical History:  Procedure Laterality Date  . ABDOMINAL HYSTERECTOMY    . LAPAROSCOPIC BILATERAL SALPINGO OOPHERECTOMY Bilateral 11/04/2014   Procedure: LAPAROSCOPIC BILATERAL SALPINGO OOPHORECTOMY;  Surgeon: Mellody Drown, MD;  Location: [facility] ORS;  Service: Gynecology;  Laterality: Bilateral;  . LAPAROSCOPIC HYSTERECTOMY N/A 11/04/2014   Procedure: HYSTERECTOMY TOTAL LAPAROSCOPIC;  Surgeon: Mellody Drown, MD;  Location: [facility] ORS;  Service: Gynecology;  Laterality: N/A;  . removed mass from thyriod      Family History: Family History  Problem Relation Age of Onset  . Breast cancer Sister 35  . Asthma Mother   . Coronary artery disease Mother   . Diabetes Brother   . Diabetes Sister   . Diabetes Sister   . Diabetes Brother     Social History: Social History   Socioeconomic History  . Marital status: Married    Spouse name: Not on file  . Number of children: Not on file  . Years of education: Not on file  . Highest education level: Not on file  Occupational History  . Not on file  Social Needs  . Financial resource strain: Not on file  . Food insecurity:     Worry: Not on file    Inability: Not on file  . Transportation needs:    Medical: Not on file    Non-medical: Not on file  Tobacco Use  . Smoking status: Former Smoker    Packs/day: 0.25    Years: 8.00    Pack years: 2.00    Types: Cigarettes    Last attempt to quit: 06/29/1990    Years since quitting: 27.8  . Smokeless tobacco: Never Used  . Tobacco comment: Smoking History Cigarettes 1 cig per day; quit 22 years ago; smoked for 8 years  Substance and Sexual Activity  . Alcohol use: No    Alcohol/week: 0.0 standard drinks    Comment: occasional   . Drug use: No  . Sexual activity: Yes    Partners: Male  Lifestyle  . Physical activity:    Days per week: Not on file    Minutes per session: Not on file  . Stress: Not on file  Relationships  . Social connections:    Talks on phone: Not on file    Gets together: Not on file    Attends religious service: Not on file    Active member of club or organization: Not on file    Attends meetings of clubs or organizations: Not on file    Relationship status: Not on file  . Intimate partner violence:  Fear of current or ex partner: Not on file    Emotionally abused: Not on file    Physically abused: Not on file    Forced sexual activity: Not on file  Other Topics Concern  . Not on file  Social History Narrative  . Not on file    Allergies: No Known Allergies  Current Medications: Current Outpatient Medications  Medication Sig Dispense Refill  . albuterol (PROVENTIL HFA;VENTOLIN HFA) 108 (90 BASE) MCG/ACT inhaler Inhale 2 puffs into the lungs every 6 (six) hours as needed for wheezing or shortness of breath.    . ALPRAZolam (XANAX) 0.25 MG tablet Take 1 tablet (0.25 mg total) by mouth 2 (two) times daily as needed for anxiety. (Patient not taking: Reported on 03/19/2018) 60 tablet 0  . cephALEXin (KEFLEX) 500 MG capsule Take 1 capsule (500 mg total) by mouth 4 (four) times daily for 5 days. 20 capsule 0  . fluticasone (FLOVENT  HFA) 220 MCG/ACT inhaler Take by mouth.    Marland Kitchen HYDROmorphone (DILAUDID) 2 MG tablet Take by mouth.    . ondansetron (ZOFRAN ODT) 4 MG disintegrating tablet Take 1 tablet (4 mg total) by mouth every 8 (eight) hours as needed for nausea or vomiting. (Patient not taking: Reported on 03/19/2018) 20 tablet 0  . ondansetron (ZOFRAN) 8 MG tablet Take 1 tablet (8 mg total) by mouth 2 (two) times daily as needed for refractory nausea / vomiting. (Patient not taking: Reported on 03/19/2018) 30 tablet 2  . oxyCODONE-acetaminophen (PERCOCET) 5-325 MG tablet Take 1 tablet by mouth every 4 (four) hours as needed for severe pain. (Patient not taking: Reported on 03/19/2018) 20 tablet 0  . prochlorperazine (COMPAZINE) 10 MG tablet Take 1 tablet (10 mg total) by mouth every 6 (six) hours as needed (Nausea or vomiting). (Patient not taking: Reported on 03/19/2018) 60 tablet 2   No current facility-administered medications for this visit.     Review of Systems General: no complaints  HEENT: no complaints  Lungs: no complaints  Cardiac: no complaints  GI: abdominal pain and bloating   GU: no complaints  Musculoskeletal: low back pain  Extremities: no complaints  Skin: no complaints  Neuro: no complaints  Endocrine: no complaints  Psych: no complaints      Objective:  Physical Examination:  BP 128/84   Pulse (!) 103   Temp (!) 97.1 F (36.2 C) (Tympanic)   Resp 18   Wt 192 lb (87.1 kg)   LMP 11/04/2011   BMI 36.28 kg/m   ECOG Performance Status: 1- symptomatic but ambulatory  GENERAL: Patient is a well appearing female in no acute distress HEENT:  Sclerae anicteric.  Oropharynx clear and moist. No ulcerations or evidence of oropharyngeal candidiasis. Neck is supple.  NODES:  No cervical, supraclavicular, or axillary lymphadenopathy palpated.  LUNGS:  Clear to auscultation bilaterally.   HEART:  Regular rate and rhythm.  ABDOMEN:  Soft, nontender.  Positive, normoactive bowel sounds. No organomegaly  palpated. Incisions are all well healed. No hernia. No ascites or hepatosplenomegaly.  Port sites negative for masses.  EXTREMITIES:  No peripheral edema.   NEURO:  Nonfocal. Well oriented.  Appropriate affect.   Pelvic: Exam Chaperoned by RN Vulva: normal appearing vulva with no masses, tenderness or lesions; Vagina: normal vagina; Adnexa: surgically absent; Uterus: surgically absent, vaginal cuff well healed; Cervix: absent; Rectal: not indicated    Assessment:  Elizabeth Davenport is a 62 y.o. female diagnosed with stage IA grade 1 endometrioid endometrial cancer and  concomitant small bilateral ovarian borderline serous tumor excrescences with positive washings s/p TLH/BSO on 11/04/2014. Diagnosed with low-grade serous cancer s/p laparoscopic omentectomy 12/13/2017 with optimal debulking surgery.    Medical cormorbities: Obesity.   Jehovah's witness.  Plan:   Problem List Items Addressed This Visit      Endocrine   Malignant neoplasm of ovary ([place]) - Primary      We discussed the current findings and recommendations with the patient and family today using an interpreter.  We are concerned she has progressive disease based on her symptoms and the CT scan. She was unable to tolerate letrozole. I agree with Dr. Gary Fleet plan to rotate the AI to anastrozole. She may benefit from a trial of Cymbalta and prescription strength Naproxen if her athritic symptoms recure. The dose of Cymbalta is 30 mg and this can be increased to 60 mg daily after 7 days if needed.   If she has progressive disease, trametinib is another therapeutic option.   She will follow up with Dr. Grayland Ormond and return to our clinic in 3 months.   A total of 25 minutes were spent with the patient/family today; >50% was spent in education, counseling and coordination of care for low grade serous ovarian cancer. The visit was performed with Elizabeth Davenport, the interpreter.      Verlon Au, NP  CC:  Dr. Rubie Maid

## 2018-04-09 NOTE — Telephone Encounter (Signed)
Received voicemail from "[place]", daughter. Attempted to return call with no answer. Voicemail left that she could return call. Oncology Nurse Navigator Documentation  Navigator Location: [facility] (04/09/18 0800)   )Navigator Encounter Type: Telephone (04/09/18 0800) Telephone: Lahoma Crocker Call (04/09/18 0800)                                                  Time Spent with Patient: 15 (04/09/18 0800)

## 2018-04-10 ENCOUNTER — Inpatient Hospital Stay: Payer: Self-pay

## 2018-04-10 ENCOUNTER — Encounter: Payer: Self-pay | Admitting: Obstetrics and Gynecology

## 2018-04-10 ENCOUNTER — Inpatient Hospital Stay (HOSPITAL_BASED_OUTPATIENT_CLINIC_OR_DEPARTMENT_OTHER): Payer: Self-pay | Admitting: Obstetrics and Gynecology

## 2018-04-10 ENCOUNTER — Inpatient Hospital Stay: Payer: Self-pay | Attending: Oncology | Admitting: Oncology

## 2018-04-10 ENCOUNTER — Other Ambulatory Visit: Payer: Self-pay

## 2018-04-10 VITALS — BP 128/84 | HR 103 | Temp 97.1°F | Resp 18 | Wt 192.7 lb

## 2018-04-10 VITALS — BP 128/84 | HR 103 | Temp 97.1°F | Resp 18 | Wt 192.0 lb

## 2018-04-10 DIAGNOSIS — Z90710 Acquired absence of both cervix and uterus: Secondary | ICD-10-CM | POA: Insufficient documentation

## 2018-04-10 DIAGNOSIS — C786 Secondary malignant neoplasm of retroperitoneum and peritoneum: Secondary | ICD-10-CM | POA: Insufficient documentation

## 2018-04-10 DIAGNOSIS — K5730 Diverticulosis of large intestine without perforation or abscess without bleeding: Secondary | ICD-10-CM | POA: Insufficient documentation

## 2018-04-10 DIAGNOSIS — C569 Malignant neoplasm of unspecified ovary: Secondary | ICD-10-CM | POA: Insufficient documentation

## 2018-04-10 DIAGNOSIS — Z90722 Acquired absence of ovaries, bilateral: Secondary | ICD-10-CM | POA: Insufficient documentation

## 2018-04-10 DIAGNOSIS — D3910 Neoplasm of uncertain behavior of unspecified ovary: Secondary | ICD-10-CM

## 2018-04-10 DIAGNOSIS — Z79899 Other long term (current) drug therapy: Secondary | ICD-10-CM | POA: Insufficient documentation

## 2018-04-10 DIAGNOSIS — M129 Arthropathy, unspecified: Secondary | ICD-10-CM | POA: Insufficient documentation

## 2018-04-10 DIAGNOSIS — Z9221 Personal history of antineoplastic chemotherapy: Secondary | ICD-10-CM

## 2018-04-10 DIAGNOSIS — Z803 Family history of malignant neoplasm of breast: Secondary | ICD-10-CM | POA: Insufficient documentation

## 2018-04-10 DIAGNOSIS — Z87891 Personal history of nicotine dependence: Secondary | ICD-10-CM

## 2018-04-10 DIAGNOSIS — Z8542 Personal history of malignant neoplasm of other parts of uterus: Secondary | ICD-10-CM | POA: Insufficient documentation

## 2018-04-10 DIAGNOSIS — J45909 Unspecified asthma, uncomplicated: Secondary | ICD-10-CM | POA: Insufficient documentation

## 2018-04-10 DIAGNOSIS — M545 Low back pain: Secondary | ICD-10-CM | POA: Insufficient documentation

## 2018-04-10 LAB — CBC WITH DIFFERENTIAL/PLATELET
Abs Immature Granulocytes: 0.01 10*3/uL (ref 0.00–0.07)
BASOS PCT: 0 %
Basophils Absolute: 0 10*3/uL (ref 0.0–0.1)
EOS ABS: 0.1 10*3/uL (ref 0.0–0.5)
EOS PCT: 2 %
HCT: 34.3 % — ABNORMAL LOW (ref 36.0–46.0)
Hemoglobin: 10.9 g/dL — ABNORMAL LOW (ref 12.0–15.0)
Immature Granulocytes: 0 %
Lymphocytes Relative: 16 %
Lymphs Abs: 1.1 10*3/uL (ref 0.7–4.0)
MCH: 27.8 pg (ref 26.0–34.0)
MCHC: 31.8 g/dL (ref 30.0–36.0)
MCV: 87.5 fL (ref 80.0–100.0)
Monocytes Absolute: 0.7 10*3/uL (ref 0.1–1.0)
Monocytes Relative: 10 %
Neutro Abs: 4.8 10*3/uL (ref 1.7–7.7)
Neutrophils Relative %: 72 %
PLATELETS: 369 10*3/uL (ref 150–400)
RBC: 3.92 MIL/uL (ref 3.87–5.11)
RDW: 18.4 % — ABNORMAL HIGH (ref 11.5–15.5)
WBC: 6.7 10*3/uL (ref 4.0–10.5)
nRBC: 0 % (ref 0.0–0.2)

## 2018-04-10 LAB — COMPREHENSIVE METABOLIC PANEL
ALK PHOS: 61 U/L (ref 38–126)
ALT: 21 U/L (ref 0–44)
AST: 19 U/L (ref 15–41)
Albumin: 3.5 g/dL (ref 3.5–5.0)
Anion gap: 9 (ref 5–15)
BILIRUBIN TOTAL: 0.7 mg/dL (ref 0.3–1.2)
BUN: 7 mg/dL — ABNORMAL LOW (ref 8–23)
CALCIUM: 8.8 mg/dL — AB (ref 8.9–10.3)
CO2: 26 mmol/L (ref 22–32)
CREATININE: 0.4 mg/dL — AB (ref 0.44–1.00)
Chloride: 101 mmol/L (ref 98–111)
GFR calc Af Amer: >60 mL/min (ref >60)
Glucose, Bld: 108 mg/dL — ABNORMAL HIGH (ref 70–99)
POTASSIUM: 4 mmol/L (ref 3.5–5.1)
Sodium: 136 mmol/L (ref 135–145)
TOTAL PROTEIN: 7.6 g/dL (ref 6.5–8.1)

## 2018-04-10 NOTE — Progress Notes (Signed)
[place]  [phone]) [phone] [phone]) [phone]  ID: Makena Murdock OB: 12-09-1955  MR#: [id number]  QMG#:[id number]  Patient Care Team: Letta Median, MD as PCP - General (Family Medicine) Clent Jacks, RN as Registered Nurse Rico Junker, RN as Registered Nurse Theodore Demark, RN as Registered Nurse  CHIEF COMPLAINT: Progressive low-grade ovarian malignancy  INTERVAL HISTORY: Patient returns to clinic today for ER follow-up and further evaluation.  She recently was evaluated with worsening abdominal pain and noted to have progressive disease on CT scan.  She discontinued her letrozole several weeks ago secondary to side effects.  She currently feels well and is back to her baseline. She has no neurologic complaints.  She denies any fevers.  She has a fair appetite and denies weight loss.  She has no chest pain or shortness of breath.  She denies any further abdominal pain or bloating. She denies any nausea, vomiting, constipation, or diarrhea.  She has no urinary complaints.  Patient offers no specific complaints today.  REVIEW OF SYSTEMS:   Review of Systems  Constitutional: Negative.  Negative for fever, malaise/fatigue and weight loss.  Respiratory: Negative.  Negative for cough and shortness of breath.   Cardiovascular: Negative.  Negative for chest pain and leg swelling.  Gastrointestinal: Negative.  Negative for abdominal pain, constipation, diarrhea, nausea and vomiting.  Genitourinary: Negative.  Negative for dysuria.  Musculoskeletal: Negative.  Negative for back pain.  Skin: Negative.  Negative for rash.  Neurological: Negative.  Negative for sensory change, focal weakness and weakness.  Psychiatric/Behavioral: Negative.  The patient is not nervous/anxious.     As per HPI. Otherwise, a complete review of systems is negative.  PAST MEDICAL HISTORY: Past Medical History:  Diagnosis Date  . Arthritis    left knee  . Asthma   .  Endometrial cancer ([place]) 11/04/14  . PONV (postoperative nausea and vomiting)     PAST SURGICAL HISTORY: Past Surgical History:  Procedure Laterality Date  . ABDOMINAL HYSTERECTOMY    . LAPAROSCOPIC BILATERAL SALPINGO OOPHERECTOMY Bilateral 11/04/2014   Procedure: LAPAROSCOPIC BILATERAL SALPINGO OOPHORECTOMY;  Surgeon: Mellody Drown, MD;  Location: [facility] ORS;  Service: Gynecology;  Laterality: Bilateral;  . LAPAROSCOPIC HYSTERECTOMY N/A 11/04/2014   Procedure: HYSTERECTOMY TOTAL LAPAROSCOPIC;  Surgeon: Mellody Drown, MD;  Location: [facility] ORS;  Service: Gynecology;  Laterality: N/A;  . removed mass from thyriod      FAMILY HISTORY: Family History  Problem Relation Age of Onset  . Breast cancer Sister 9  . Asthma Mother   . Coronary artery disease Mother   . Diabetes Brother   . Diabetes Sister   . Diabetes Sister   . Diabetes Brother     ADVANCED DIRECTIVES (Y/N):  N  HEALTH MAINTENANCE: Social History   Tobacco Use  . Smoking status: Former Smoker    Packs/day: 0.25    Years: 8.00    Pack years: 2.00    Types: Cigarettes    Last attempt to quit: 06/29/1990    Years since quitting: 27.8  . Smokeless tobacco: Never Used  . Tobacco comment: Smoking History Cigarettes 1 cig per day; quit 22 years ago; smoked for 8 years  Substance Use Topics  . Alcohol use: No    Alcohol/week: 0.0 standard drinks    Comment: occasional   . Drug use: No     Colonoscopy:  PAP:  Bone density:  Lipid panel:  No Known Allergies  Current Outpatient Medications  Medication Sig Dispense Refill  .  albuterol (PROVENTIL HFA;VENTOLIN HFA) 108 (90 BASE) MCG/ACT inhaler Inhale 2 puffs into the lungs every 6 (six) hours as needed for wheezing or shortness of breath.    . ALPRAZolam (XANAX) 0.25 MG tablet Take 1 tablet (0.25 mg total) by mouth 2 (two) times daily as needed for anxiety. (Patient not taking: Reported on 03/19/2018) 60 tablet 0  . exemestane (AROMASIN) 25 MG tablet Take 1 tablet (25  mg total) by mouth daily after breakfast. 30 tablet 3  . fluticasone (FLOVENT HFA) 220 MCG/ACT inhaler Take by mouth.    Marland Kitchen HYDROmorphone (DILAUDID) 2 MG tablet Take by mouth.    . ondansetron (ZOFRAN ODT) 4 MG disintegrating tablet Take 1 tablet (4 mg total) by mouth every 8 (eight) hours as needed for nausea or vomiting. (Patient not taking: Reported on 03/19/2018) 20 tablet 0  . ondansetron (ZOFRAN) 8 MG tablet Take 1 tablet (8 mg total) by mouth 2 (two) times daily as needed for refractory nausea / vomiting. (Patient not taking: Reported on 03/19/2018) 30 tablet 2  . oxyCODONE-acetaminophen (PERCOCET) 5-325 MG tablet Take 1 tablet by mouth every 4 (four) hours as needed for severe pain. (Patient not taking: Reported on 03/19/2018) 20 tablet 0  . prochlorperazine (COMPAZINE) 10 MG tablet Take 1 tablet (10 mg total) by mouth every 6 (six) hours as needed (Nausea or vomiting). (Patient not taking: Reported on 03/19/2018) 60 tablet 2   No current facility-administered medications for this visit.     OBJECTIVE: Vitals:   04/10/18 1307  BP: 128/84  Pulse: (!) 103  Resp: 18  Temp: (!) 97.1 F (36.2 C)     Body mass index is 36.41 kg/m.    ECOG FS:0 - Asymptomatic  General: Well-developed, well-nourished, no acute distress. Eyes: Pink conjunctiva, anicteric sclera. HEENT: Normocephalic, moist mucous membranes, clear oropharnyx. Lungs: Clear to auscultation bilaterally. Heart: Regular rate and rhythm. No rubs, murmurs, or gallops. Abdomen: Soft, nontender, mild distention, normoactive bowel sounds. Musculoskeletal: No edema, cyanosis, or clubbing. Neuro: Alert, answering all questions appropriately. Cranial nerves grossly intact. Skin: No rashes or petechiae noted. Psych: Normal affect.  LAB RESULTS:  Lab Results  Component Value Date   NA 136 04/10/2018   K 4.0 04/10/2018   CL 101 04/10/2018   CO2 26 04/10/2018   GLUCOSE 108 (H) 04/10/2018   BUN 7 (L) 04/10/2018   CREATININE 0.40  (L) 04/10/2018   CALCIUM 8.8 (L) 04/10/2018   PROT 7.6 04/10/2018   ALBUMIN 3.5 04/10/2018   AST 19 04/10/2018   ALT 21 04/10/2018   ALKPHOS 61 04/10/2018   BILITOT 0.7 04/10/2018   GFRNONAA >60 04/10/2018   GFRAA >60 04/10/2018    Lab Results  Component Value Date   WBC 6.7 04/10/2018   NEUTROABS 4.8 04/10/2018   HGB 10.9 (L) 04/10/2018   HCT 34.3 (L) 04/10/2018   MCV 87.5 04/10/2018   PLT 369 04/10/2018     STUDIES: Dg Bone Density  Result Date: 03/27/2018 EXAM: DUAL X-RAY ABSORPTIOMETRY (DXA) FOR BONE MINERAL DENSITY IMPRESSION: Technologist: MTB Your patient Aseret Hoffman completed a BMD test on 03/27/2018 using the [place] (analysis version: 14.10) manufactured by [employer]. The following summarizes the results of our evaluation. PATIENT BIOGRAPHICAL: Name: Elayjah, Chaney Patient ID: [id number] Birth Date: October 03, 1955 Height: 61.0 in. Gender: Female Exam Date: 03/27/2018 Weight: 196.0 lbs. Indications: Ovarian Cancer, Postmenopausal, History of Fracture (Adult) Fractures: Ankle Treatments: Albuterol, Femara, Flovent ASSESSMENT: The BMD measured at AP Spine L1-L4 (L3) is 1.018  g/cm2 with a T-score of -1.4. The quality of the scan is good. This patient is considered osteopenic according to [facility]) criteria. L-3 was excluded due to (degenerative changes. Site Region Measured Measured WHO Young Adult BMD Date       Age      Classification T-score AP Spine L1-L4 (L3) 03/27/2018 60.9 Osteopenia -1.4 1.018 g/cm2 DualFemur Neck Right 03/27/2018 60.9 Osteopenia -1.4 0.841 g/cm2 DualFemur Total Mean 03/27/2018 60.9 Normal -0.4 0.954 g/cm2 World Health Organization [facility]) criteria for post-menopausal, Caucasian Women: Normal:       T-score at or above -1 SD Osteopenia:   T-score between -1 and -2.5 SD Osteoporosis: T-score at or below -2.5 SD RECOMMENDATIONS: 1. All patients should optimize calcium and vitamin D intake. 2. Consider FDA-approved medical  therapies in postmenopausal women and men aged 62 years and older, based on the following: a. A hip or vertebral(clinical or morphometric) fracture b. T-score < -2.5 at the femoral neck or spine after appropriate evaluation to exclude secondary causes c. Low bone mass (T-score between -1.0 and -2.5 at the femoral neck or spine) and a 10-year probability of a hip fracture > 3% or a 10-year probability of a major osteoporosis-related fracture > 20% based on the [place]-adapted WHO algorithm d. Clinician judgment and/or patient preferences may indicate treatment for people with 10-year fracture probabilities above or below these levels FOLLOW-UP: Patients with diagnosis of osteoporosis or at high risk for fracture should have regular bone mineral density tests. For patients eligible for Medicare, routine testing is allowed once every 2 years. The testing frequency can be increased to one year for patients who have rapidly progressing disease, those who are receiving or discontinuing medical therapy to restore bone mass, or have additional risk factors. I have reviewed this report, and agree with the above findings. [facility] Radiology Technologist: MTB Your patient Tonette Koehne completed a FRAX assessment on 03/27/2018 using the [place] (analysis version: 14.10) manufactured by [employer]. The following summarizes the results of our evaluation. PATIENT BIOGRAPHICAL: Name: Malyah, Ohlrich Patient ID: [id number] Birth Date: 26-Apr-1956 Height:    61.0 in. Gender:     Female    [age]        60.9       Weight:    196.0 lbs. Ethnicity:  Hispanic                         Exam Date: 03/27/2018 FRAX* RESULTS:  (version: 3.5) 10-year Probability of Fracture1 Major Osteoporotic Fracture2 Hip Fracture 7.0% 0.6% Population: [place] (Hispanic) Risk Factors: History of Fracture (Adult) Based on Femur (Right) Neck BMD 1 -The 10-year probability of fracture may be lower than reported if the patient has received treatment. 2 -Major  Osteoporotic Fracture: Clinical Spine, Forearm, Hip or Shoulder *FRAX is a Materials engineer of the [employer] for Metabolic Bone Disease, a [place] (WHO) [facility]. ASSESSMENT: The probability of a major osteoporotic fracture is 7.0% within the next ten years. The probability of a hip fracture is 0.6% within the next ten years. . Electronically Signed   By: Lowella Grip III M.D.   On: 03/27/2018 16:18   Ct Renal Stone Study  Result Date: 04/05/2018 CLINICAL DATA:  Abdominal bloating and bilateral low back pain. Frequent urination. EXAM: CT ABDOMEN AND PELVIS WITHOUT CONTRAST TECHNIQUE: Multidetector CT imaging of the abdomen and pelvis was performed following the standard protocol without IV contrast.  COMPARISON:  10/31/2017 FINDINGS: Lower chest: Lung bases are clear. Prominence of retrocrural and right cardiophrenic angle lymph nodes, similar to prior study. Possibly metastatic. Hepatobiliary: Scattered subcentimeter low-attenuation lesions are too small to characterize but probably represent cysts. Gallbladder and bile ducts are unremarkable. Pancreas: Unremarkable. No pancreatic ductal dilatation or surrounding inflammatory changes. Spleen: Normal in size without focal abnormality. Adrenals/Urinary Tract: No adrenal gland nodules. Midpole cyst in the right kidney is unchanged. No hydronephrosis or hydroureter. No renal, ureteral, or bladder stones. No bladder wall thickening. Stomach/Bowel: Stomach, small bowel, and colon are not abnormally distended and are mostly decompressed. Scattered stool throughout the colon. No wall thickening is appreciated although lack of distention limits evaluation of the wall. Diverticula in the sigmoid colon without evidence of diverticulitis. Appendix is normal. Vascular/Lymphatic: Scattered aortic calcification. No aortic aneurysm. Retroperitoneal lymph nodes are not pathologically enlarged. Reproductive:  Status post hysterectomy. No adnexal masses. Other: Diffuse nodular infiltration throughout the mesentery and omentum consistent with peritoneal carcinomatosis. There is free fluid with mildly increased density in the pelvis, increasing since the previous study. This likely represents free fluid related to metastatic disease, possibly with hemorrhage from endometrial implants. No loculated collections. Musculoskeletal: No destructive bone lesions. IMPRESSION: 1. No renal or ureteral stone or obstruction. 2. No evidence of bowel obstruction or inflammation. 3. Diffuse nodular infiltration throughout the mesentery and omentum consistent with peritoneal carcinomatosis. Increased density in the pelvis likely represents free fluid related to metastatic disease, possibly with hemorrhage or proteinaceous material. 4. Prominence of retrocrural and right cardiophrenic angle lymph nodes, similar to prior study, likely metastatic. 5. Probable hepatic cysts. Aortic Atherosclerosis (ICD10-I70.0). These results were called by telephone at the time of interpretation on 04/05/2018 at 11:05 pm to Dr. Eula Listen , who verbally acknowledged these results. Electronically Signed   By: Lucienne Capers M.D.   On: 04/05/2018 23:07    ASSESSMENT: Progressive low-grade ovarian malignancy. PLAN:   1.  Progressive low-grade ovarian malignancy: Case discussed with gynecology oncology.  Patient previously underwent optimal surgical debulking and then completed 3 cycles of adjuvant carboplatinum and Taxol on February 26, 2018.  Patient was initially placed on maintenance letrozole, but could not tolerate treatment.  CT scan results from April 05, 2018 reviewed independently and report as above with likely progressive disease.  Her CA-125 has also increased significantly to 93.8.  Letrozole has been discontinued and patient was given a prescription for Aromasin today.  Return to clinic in 1 month with repeat laboratory work and  further evaluation.     2.  History of endometrial cancer: Patient had total hysterectomy.  Imaging as above. 3.  Anemia: Hemoglobin has trended down slightly and is now 10.9, monitor. 4.  Leukopenia: Resolved.   5.  Anxiety: Continue Xanax as needed.  The entire visit was not done in the presence of interpreter.    Patient expressed understanding and was in agreement with this plan. She also understands that She can call clinic at any time with any questions, concerns, or complaints.   Cancer Staging Borderline epithelial neoplasm of ovary Staging form: Ovary, Fallopian Tube, and Primary Peritoneal Carcinoma, AJCC 8th Edition - Clinical stage from 01/11/2018: FIGO Stage III (cT3, cN0, cM0) - Signed by Lloyd Huger, MD on 01/11/2018  Endometrial cancer [facility]) Staging form: Corpus Uteri - Carcinoma, AJCC 7th Edition - Clinical: Stage I (T1, N0, M0) - Unsigned  Ovarian carcinoma, unspecified laterality ([place]) Staging form: Ovary, AJCC 7th Edition - Clinical: Stage Unknown (T1c, NX,  M0) - Signed by Forest Gleason, MD on 11/27/2014   Lloyd Huger, MD   04/12/2018 9:50 AM

## 2018-04-10 NOTE — Progress Notes (Signed)
Here for follow up and to see Dr Karolee Ohs. Pt stated she has intermittent left lower abd quad pain and left lower back pain.

## 2018-04-11 ENCOUNTER — Other Ambulatory Visit: Payer: Self-pay | Admitting: *Deleted

## 2018-04-11 ENCOUNTER — Telehealth: Payer: Self-pay | Admitting: *Deleted

## 2018-04-11 LAB — CA 125: Cancer Antigen (CA) 125: 93.8 U/mL — ABNORMAL HIGH (ref 0.0–38.1)

## 2018-04-11 MED ORDER — EXEMESTANE 25 MG PO TABS: 25.0000 mg | ORAL_TABLET | Freq: Every day | ORAL | 3 refills | Status: DC

## 2018-04-11 NOTE — Telephone Encounter (Signed)
Aromasin

## 2018-04-11 NOTE — Telephone Encounter (Signed)
Daughter states prescription was to be sent to [facility] yesterday, but nothing was received . She said it was a cancer pill. Please advise

## 2018-04-29 ENCOUNTER — Ambulatory Visit: Payer: Self-pay | Admitting: Oncology

## 2018-05-08 ENCOUNTER — Ambulatory Visit: Payer: Self-pay

## 2018-05-10 ENCOUNTER — Inpatient Hospital Stay: Payer: Self-pay | Attending: Oncology

## 2018-05-10 ENCOUNTER — Other Ambulatory Visit: Payer: Self-pay

## 2018-05-10 DIAGNOSIS — Z8542 Personal history of malignant neoplasm of other parts of uterus: Secondary | ICD-10-CM | POA: Insufficient documentation

## 2018-05-10 DIAGNOSIS — Z79811 Long term (current) use of aromatase inhibitors: Secondary | ICD-10-CM | POA: Insufficient documentation

## 2018-05-10 DIAGNOSIS — F419 Anxiety disorder, unspecified: Secondary | ICD-10-CM | POA: Insufficient documentation

## 2018-05-10 DIAGNOSIS — M129 Arthropathy, unspecified: Secondary | ICD-10-CM | POA: Insufficient documentation

## 2018-05-10 DIAGNOSIS — C569 Malignant neoplasm of unspecified ovary: Secondary | ICD-10-CM | POA: Insufficient documentation

## 2018-05-10 DIAGNOSIS — D3910 Neoplasm of uncertain behavior of unspecified ovary: Secondary | ICD-10-CM

## 2018-05-10 DIAGNOSIS — Z79899 Other long term (current) drug therapy: Secondary | ICD-10-CM | POA: Insufficient documentation

## 2018-05-10 DIAGNOSIS — J45909 Unspecified asthma, uncomplicated: Secondary | ICD-10-CM | POA: Insufficient documentation

## 2018-05-10 DIAGNOSIS — Z803 Family history of malignant neoplasm of breast: Secondary | ICD-10-CM | POA: Insufficient documentation

## 2018-05-10 DIAGNOSIS — D649 Anemia, unspecified: Secondary | ICD-10-CM | POA: Insufficient documentation

## 2018-05-10 DIAGNOSIS — Z87891 Personal history of nicotine dependence: Secondary | ICD-10-CM | POA: Insufficient documentation

## 2018-05-10 LAB — COMPREHENSIVE METABOLIC PANEL
ALBUMIN: 3 g/dL — AB (ref 3.5–5.0)
ALK PHOS: 50 U/L (ref 38–126)
ALT: 12 U/L (ref 0–44)
AST: 13 U/L — ABNORMAL LOW (ref 15–41)
Anion gap: 9 (ref 5–15)
BUN: 9 mg/dL (ref 8–23)
CALCIUM: 8.4 mg/dL — AB (ref 8.9–10.3)
CO2: 26 mmol/L (ref 22–32)
CREATININE: 0.48 mg/dL (ref 0.44–1.00)
Chloride: 104 mmol/L (ref 98–111)
GFR calc non Af Amer: >60 mL/min (ref >60)
GLUCOSE: 121 mg/dL — AB (ref 70–99)
Potassium: 4.2 mmol/L (ref 3.5–5.1)
SODIUM: 139 mmol/L (ref 135–145)
Total Bilirubin: 0.4 mg/dL (ref 0.3–1.2)
Total Protein: 7.2 g/dL (ref 6.5–8.1)

## 2018-05-10 LAB — CBC WITH DIFFERENTIAL/PLATELET
Abs Immature Granulocytes: 0.04 10*3/uL (ref 0.00–0.07)
Basophils Absolute: 0 10*3/uL (ref 0.0–0.1)
Basophils Relative: 0 %
EOS ABS: 0.1 10*3/uL (ref 0.0–0.5)
EOS PCT: 2 %
HEMATOCRIT: 29.6 % — AB (ref 36.0–46.0)
Hemoglobin: 9.1 g/dL — ABNORMAL LOW (ref 12.0–15.0)
Immature Granulocytes: 1 %
LYMPHS ABS: 1.3 10*3/uL (ref 0.7–4.0)
Lymphocytes Relative: 21 %
MCH: 26.6 pg (ref 26.0–34.0)
MCHC: 30.7 g/dL (ref 30.0–36.0)
MCV: 86.5 fL (ref 80.0–100.0)
MONO ABS: 0.5 10*3/uL (ref 0.1–1.0)
MONOS PCT: 8 %
Neutro Abs: 4.2 10*3/uL (ref 1.7–7.7)
Neutrophils Relative %: 68 %
PLATELETS: 359 10*3/uL (ref 150–400)
RBC: 3.42 MIL/uL — ABNORMAL LOW (ref 3.87–5.11)
RDW: 17 % — ABNORMAL HIGH (ref 11.5–15.5)
WBC: 6.2 10*3/uL (ref 4.0–10.5)
nRBC: 0 % (ref 0.0–0.2)

## 2018-05-11 NOTE — Progress Notes (Signed)
[place]  Telephone:(336) [phone] Fax:(336) [phone]  ID: Elizabeth Davenport OB: [birth year]  MR#: [id number]  JSE#:[id number]  Patient Care Team: Letta Median, MD as PCP - General (Family Medicine) Clent Jacks, RN as Registered Nurse Rico Junker, RN as Registered Nurse Theodore Demark, RN as Registered Nurse  CHIEF COMPLAINT: Progressive low-grade ovarian malignancy  INTERVAL HISTORY: Patient returns to clinic today for further evaluation and assess her toleration of Aromasin.  She is tolerating treatment well without significant side effects.  She has no further abdominal pain.  She denies any joint pain.  She has no neurologic complaints.  She denies any fevers.  She has a fair appetite and denies weight loss.  She has no chest pain or shortness of breath. She denies any nausea, vomiting, constipation, or diarrhea.  She has no urinary complaints.  Patient feels that her baseline offers no specific complaints today.  REVIEW OF SYSTEMS:   Review of Systems  Constitutional: Negative.  Negative for fever, malaise/fatigue and weight loss.  Respiratory: Negative.  Negative for cough and shortness of breath.   Cardiovascular: Negative.  Negative for chest pain and leg swelling.  Gastrointestinal: Negative.  Negative for abdominal pain, constipation, diarrhea, nausea and vomiting.  Genitourinary: Negative.  Negative for dysuria.  Musculoskeletal: Negative.  Negative for back pain.  Skin: Negative.  Negative for rash.  Neurological: Negative.  Negative for sensory change, focal weakness and weakness.  Psychiatric/Behavioral: Negative.  The patient is not nervous/anxious.     As per HPI. Otherwise, a complete review of systems is negative.  PAST MEDICAL HISTORY: Past Medical History:  Diagnosis Date  . Arthritis    left knee  . Asthma   . Endometrial cancer ([place]) 11/04/14  . PONV (postoperative nausea and vomiting)     PAST SURGICAL HISTORY: Past  Surgical History:  Procedure Laterality Date  . ABDOMINAL HYSTERECTOMY    . LAPAROSCOPIC BILATERAL SALPINGO OOPHERECTOMY Bilateral 11/04/2014   Procedure: LAPAROSCOPIC BILATERAL SALPINGO OOPHORECTOMY;  Surgeon: Mellody Drown, MD;  Location: [facility] ORS;  Service: Gynecology;  Laterality: Bilateral;  . LAPAROSCOPIC HYSTERECTOMY N/A 11/04/2014   Procedure: HYSTERECTOMY TOTAL LAPAROSCOPIC;  Surgeon: Mellody Drown, MD;  Location: [facility] ORS;  Service: Gynecology;  Laterality: N/A;  . removed mass from thyriod      FAMILY HISTORY: Family History  Problem Relation Age of Onset  . Breast cancer Sister 102  . Asthma Mother   . Coronary artery disease Mother   . Diabetes Brother   . Diabetes Sister   . Diabetes Sister   . Diabetes Brother     ADVANCED DIRECTIVES (Y/N):  N  HEALTH MAINTENANCE: Social History   Tobacco Use  . Smoking status: Former Smoker    Packs/day: 0.25    Years: 8.00    Pack years: 2.00    Types: Cigarettes    Last attempt to quit: 06/29/1990    Years since quitting: 27.8  . Smokeless tobacco: Never Used  . Tobacco comment: Smoking History Cigarettes 1 cig per day; quit 22 years ago; smoked for 8 years  Substance Use Topics  . Alcohol use: No    Alcohol/week: 0.0 standard drinks    Comment: occasional   . Drug use: No     Colonoscopy:  PAP:  Bone density:  Lipid panel:  No Known Allergies  Current Outpatient Medications  Medication Sig Dispense Refill  . exemestane (AROMASIN) 25 MG tablet Take 1 tablet (25 mg total) by mouth daily after breakfast. 30  tablet 3  . albuterol (PROVENTIL HFA;VENTOLIN HFA) 108 (90 BASE) MCG/ACT inhaler Inhale 2 puffs into the lungs every 6 (six) hours as needed for wheezing or shortness of breath.    . ALPRAZolam (XANAX) 0.25 MG tablet Take 1 tablet (0.25 mg total) by mouth 2 (two) times daily as needed for anxiety. (Patient not taking: Reported on 03/19/2018) 60 tablet 0  . fluticasone (FLOVENT HFA) 220 MCG/ACT inhaler Take by  mouth.    Marland Kitchen HYDROmorphone (DILAUDID) 2 MG tablet Take by mouth.    . ondansetron (ZOFRAN ODT) 4 MG disintegrating tablet Take 1 tablet (4 mg total) by mouth every 8 (eight) hours as needed for nausea or vomiting. (Patient not taking: Reported on 03/19/2018) 20 tablet 0  . ondansetron (ZOFRAN) 8 MG tablet Take 1 tablet (8 mg total) by mouth 2 (two) times daily as needed for refractory nausea / vomiting. (Patient not taking: Reported on 03/19/2018) 30 tablet 2  . oxyCODONE-acetaminophen (PERCOCET) 5-325 MG tablet Take 1 tablet by mouth every 4 (four) hours as needed for severe pain. (Patient not taking: Reported on 03/19/2018) 20 tablet 0  . prochlorperazine (COMPAZINE) 10 MG tablet Take 1 tablet (10 mg total) by mouth every 6 (six) hours as needed (Nausea or vomiting). (Patient not taking: Reported on 03/19/2018) 60 tablet 2   No current facility-administered medications for this visit.     OBJECTIVE: Vitals:   05/13/18 1104  BP: 123/85  Pulse: 91  Resp: 18  Temp: 98 F (36.7 C)     Body mass index is 35.52 kg/m.    ECOG FS:0 - Asymptomatic  General: Well-developed, well-nourished, no acute distress. Eyes: Pink conjunctiva, anicteric sclera. HEENT: Normocephalic, moist mucous membranes. Lungs: Clear to auscultation bilaterally. Heart: Regular rate and rhythm. No rubs, murmurs, or gallops. Abdomen: Soft, nontender, nondistended. No organomegaly noted, normoactive bowel sounds. Musculoskeletal: No edema, cyanosis, or clubbing. Neuro: Alert, answering all questions appropriately. Cranial nerves grossly intact. Skin: No rashes or petechiae noted. Psych: Normal affect.  LAB RESULTS:  Lab Results  Component Value Date   NA 139 05/10/2018   K 4.2 05/10/2018   CL 104 05/10/2018   CO2 26 05/10/2018   GLUCOSE 121 (H) 05/10/2018   BUN 9 05/10/2018   CREATININE 0.48 05/10/2018   CALCIUM 8.4 (L) 05/10/2018   PROT 7.2 05/10/2018   ALBUMIN 3.0 (L) 05/10/2018   AST 13 (L) 05/10/2018   ALT  12 05/10/2018   ALKPHOS 50 05/10/2018   BILITOT 0.4 05/10/2018   GFRNONAA >60 05/10/2018   GFRAA >60 05/10/2018    Lab Results  Component Value Date   WBC 6.2 05/10/2018   NEUTROABS 4.2 05/10/2018   HGB 9.1 (L) 05/10/2018   HCT 29.6 (L) 05/10/2018   MCV 86.5 05/10/2018   PLT 359 05/10/2018     STUDIES: No results found.  ASSESSMENT: Progressive low-grade ovarian malignancy. PLAN:   1.  Progressive low-grade ovarian malignancy: Case discussed with gynecology oncology.  Patient previously underwent optimal surgical debulking and then completed 3 cycles of adjuvant carboplatinum and Taxol on February 26, 2018.  Patient was initially placed on maintenance letrozole, but could not tolerate treatment.  CT scan results from April 05, 2018 reviewed independently with likely progressive disease.  Her CA-125 has also increased significantly to 93.8, today's result is pending.  Continue Aromasin.  Patient has been instructed to keep her follow-up appointment for laboratory work and gynecology oncology evaluation in approximately 1 month.  Return to clinic in 3 months with  repeat laboratory work and further evaluation. 2.  History of endometrial cancer: Patient had total hysterectomy.  Imaging as above. 3.  Anemia: Patient's hemoglobin has trended down to 9.1, monitor. 4.  Leukopenia: Resolved.   5.  Anxiety: Continue Xanax as needed.  The entire visit was not done in the presence of interpreter.  Patient expressed understanding and was in agreement with this plan. She also understands that She can call clinic at any time with any questions, concerns, or complaints.   Cancer Staging Borderline epithelial neoplasm of ovary Staging form: Ovary, Fallopian Tube, and Primary Peritoneal Carcinoma, AJCC 8th Edition - Clinical stage from 01/11/2018: FIGO Stage III (cT3, cN0, cM0) - Signed by Lloyd Huger, MD on 01/11/2018  Endometrial cancer [facility]) Staging form: Corpus Uteri - Carcinoma,  AJCC 7th Edition - Clinical: Stage I (T1, N0, M0) - Unsigned  Ovarian carcinoma, unspecified laterality ([place]) Staging form: Ovary, AJCC 7th Edition - Clinical: Stage Unknown (T1c, NX, M0) - Signed by Forest Gleason, MD on 11/27/2014   Lloyd Huger, MD   05/13/2018 1:23 PM

## 2018-05-13 ENCOUNTER — Inpatient Hospital Stay (HOSPITAL_BASED_OUTPATIENT_CLINIC_OR_DEPARTMENT_OTHER): Payer: Self-pay | Admitting: Oncology

## 2018-05-13 ENCOUNTER — Other Ambulatory Visit: Payer: Self-pay

## 2018-05-13 VITALS — BP 123/85 | HR 91 | Temp 98.0°F | Resp 18 | Wt 188.0 lb

## 2018-05-13 DIAGNOSIS — F419 Anxiety disorder, unspecified: Secondary | ICD-10-CM

## 2018-05-13 DIAGNOSIS — D649 Anemia, unspecified: Secondary | ICD-10-CM

## 2018-05-13 DIAGNOSIS — Z87891 Personal history of nicotine dependence: Secondary | ICD-10-CM

## 2018-05-13 DIAGNOSIS — M129 Arthropathy, unspecified: Secondary | ICD-10-CM

## 2018-05-13 DIAGNOSIS — C569 Malignant neoplasm of unspecified ovary: Secondary | ICD-10-CM

## 2018-05-13 DIAGNOSIS — Z79811 Long term (current) use of aromatase inhibitors: Secondary | ICD-10-CM

## 2018-05-13 DIAGNOSIS — D3910 Neoplasm of uncertain behavior of unspecified ovary: Secondary | ICD-10-CM

## 2018-05-13 DIAGNOSIS — Z79899 Other long term (current) drug therapy: Secondary | ICD-10-CM

## 2018-05-13 DIAGNOSIS — Z8542 Personal history of malignant neoplasm of other parts of uterus: Secondary | ICD-10-CM

## 2018-05-13 DIAGNOSIS — Z803 Family history of malignant neoplasm of breast: Secondary | ICD-10-CM

## 2018-05-13 NOTE — Progress Notes (Signed)
Here for follow up here w interprter Ronnald Collum. Per pt " doing well overall "

## 2018-06-03 ENCOUNTER — Other Ambulatory Visit: Payer: Self-pay

## 2018-06-03 ENCOUNTER — Inpatient Hospital Stay: Payer: Self-pay | Attending: Nurse Practitioner | Admitting: Oncology

## 2018-06-03 ENCOUNTER — Telehealth: Payer: Self-pay | Admitting: *Deleted

## 2018-06-03 ENCOUNTER — Telehealth: Payer: Self-pay

## 2018-06-03 ENCOUNTER — Ambulatory Visit
Admission: RE | Admit: 2018-06-03 | Discharge: 2018-06-03 | Disposition: A | Discharge status: Home / Self Care | Payer: Self-pay | Source: Ambulatory Visit | Attending: Oncology | Admitting: Oncology

## 2018-06-03 VITALS — BP 114/77 | HR 102 | Temp 98.1°F | Resp 18 | Wt 186.0 lb

## 2018-06-03 DIAGNOSIS — Z5111 Encounter for antineoplastic chemotherapy: Secondary | ICD-10-CM | POA: Insufficient documentation

## 2018-06-03 DIAGNOSIS — R188 Other ascites: Secondary | ICD-10-CM | POA: Insufficient documentation

## 2018-06-03 DIAGNOSIS — Z79811 Long term (current) use of aromatase inhibitors: Secondary | ICD-10-CM | POA: Insufficient documentation

## 2018-06-03 DIAGNOSIS — R109 Unspecified abdominal pain: Secondary | ICD-10-CM | POA: Insufficient documentation

## 2018-06-03 DIAGNOSIS — C569 Malignant neoplasm of unspecified ovary: Secondary | ICD-10-CM

## 2018-06-03 DIAGNOSIS — K219 Gastro-esophageal reflux disease without esophagitis: Secondary | ICD-10-CM | POA: Insufficient documentation

## 2018-06-03 DIAGNOSIS — M79606 Pain in leg, unspecified: Secondary | ICD-10-CM | POA: Insufficient documentation

## 2018-06-03 DIAGNOSIS — C541 Malignant neoplasm of endometrium: Secondary | ICD-10-CM | POA: Insufficient documentation

## 2018-06-03 DIAGNOSIS — Z87891 Personal history of nicotine dependence: Secondary | ICD-10-CM | POA: Insufficient documentation

## 2018-06-03 DIAGNOSIS — R000 Tachycardia, unspecified: Secondary | ICD-10-CM | POA: Insufficient documentation

## 2018-06-03 DIAGNOSIS — R634 Abnormal weight loss: Secondary | ICD-10-CM | POA: Insufficient documentation

## 2018-06-03 DIAGNOSIS — I10 Essential (primary) hypertension: Secondary | ICD-10-CM | POA: Insufficient documentation

## 2018-06-03 DIAGNOSIS — D509 Iron deficiency anemia, unspecified: Secondary | ICD-10-CM | POA: Insufficient documentation

## 2018-06-03 DIAGNOSIS — M542 Cervicalgia: Secondary | ICD-10-CM | POA: Insufficient documentation

## 2018-06-03 DIAGNOSIS — K7689 Other specified diseases of liver: Secondary | ICD-10-CM | POA: Insufficient documentation

## 2018-06-03 DIAGNOSIS — R140 Abdominal distension (gaseous): Secondary | ICD-10-CM

## 2018-06-03 DIAGNOSIS — G893 Neoplasm related pain (acute) (chronic): Secondary | ICD-10-CM | POA: Insufficient documentation

## 2018-06-03 DIAGNOSIS — C786 Secondary malignant neoplasm of retroperitoneum and peritoneum: Secondary | ICD-10-CM | POA: Insufficient documentation

## 2018-06-03 DIAGNOSIS — Z90722 Acquired absence of ovaries, bilateral: Secondary | ICD-10-CM | POA: Insufficient documentation

## 2018-06-03 DIAGNOSIS — M545 Low back pain: Secondary | ICD-10-CM | POA: Insufficient documentation

## 2018-06-03 DIAGNOSIS — Z803 Family history of malignant neoplasm of breast: Secondary | ICD-10-CM | POA: Insufficient documentation

## 2018-06-03 DIAGNOSIS — Z79899 Other long term (current) drug therapy: Secondary | ICD-10-CM | POA: Insufficient documentation

## 2018-06-03 DIAGNOSIS — K5900 Constipation, unspecified: Secondary | ICD-10-CM | POA: Insufficient documentation

## 2018-06-03 DIAGNOSIS — R599 Enlarged lymph nodes, unspecified: Secondary | ICD-10-CM | POA: Insufficient documentation

## 2018-06-03 DIAGNOSIS — E871 Hypo-osmolality and hyponatremia: Secondary | ICD-10-CM | POA: Insufficient documentation

## 2018-06-03 DIAGNOSIS — C641 Malignant neoplasm of right kidney, except renal pelvis: Secondary | ICD-10-CM

## 2018-06-03 DIAGNOSIS — Z90710 Acquired absence of both cervix and uterus: Secondary | ICD-10-CM | POA: Insufficient documentation

## 2018-06-03 DIAGNOSIS — C561 Malignant neoplasm of right ovary: Secondary | ICD-10-CM | POA: Insufficient documentation

## 2018-06-03 DIAGNOSIS — R531 Weakness: Secondary | ICD-10-CM | POA: Insufficient documentation

## 2018-06-03 DIAGNOSIS — F419 Anxiety disorder, unspecified: Secondary | ICD-10-CM | POA: Insufficient documentation

## 2018-06-03 DIAGNOSIS — K5730 Diverticulosis of large intestine without perforation or abscess without bleeding: Secondary | ICD-10-CM | POA: Insufficient documentation

## 2018-06-03 DIAGNOSIS — R971 Elevated cancer antigen 125 [CA 125]: Secondary | ICD-10-CM | POA: Insufficient documentation

## 2018-06-03 DIAGNOSIS — G629 Polyneuropathy, unspecified: Secondary | ICD-10-CM | POA: Insufficient documentation

## 2018-06-03 NOTE — Progress Notes (Signed)
Symptom Management Consult note [facility]  Telephone:([phone] [phone]  Patient Care Team: Letta Median, MD as PCP - General (Family Medicine) Clent Jacks, RN as Registered Nurse Rico Junker, RN as Registered Nurse Theodore Demark, RN as Registered Nurse   Name of the patient: Elizabeth Davenport  [id number]  [birth year]   Date of visit: 06/03/2018  Diagnosis: Low-grade serous endometrial cancer  Chief Complaint: abdominal swelling/discomfort; neck pain  Current Treatment: Anastrozole  Oncology History: Patient was last seen by primary medical oncologist Dr. Grayland Ormond on 05/13/2018 for evaluation and to assess tolerance of Aromasin.  At that visit, she appeared to be feeling well and to have minimal side effects.  Her Ca1 25 was increased to 93.8.  She was encouraged to continue her Aromasin and to keep her follow-up appointment with GYN-Oncology.   She was previously seen by GYN-oncology on 04/10/2018 by Dr. Theora Gianotti.  Per her note, there were concerns for progressive disease based on her symptoms and recent CT scan.  She agreed that d/t intolerance of letrozole that anastrozole could be tried.  If she does indeed have progressive disease, trametinib was another available option.  She was scheduled to return to clinic in 3 months for further evaluation.  She was seen in the ER on 04/05/2018 for lower back pain, abdominal bloating and frequent urination.  She was diagnosed with possible UTI and treated with cephalexin.  CT renal stone study revealed new increased density and pelvis that likely represented free fluid related to metastatic disease.    Oncology History   She has no complaints today. Normal GI/GU function. Accompanied by daughter and interpreter.  Oncology history: Elizabeth Davenport is a 62 y.o. G2P2 Hispanic woman who developed light vaginal bleeding in early 2016 after undergoing LMP/menopause in 2014. Dr DeFrancesco did PAP  that was normal. 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 CT scan 09/22/14 abd/pelvis negative for metastatic disease. She was referred to [facility] and on exam the cervix was normal. Slides were reviewed at [facility] and read as probable endometrial primary. She underwent TLH/BSO Nov 04, 2014 at [facility]. Right ovarian excresence noted at surgery. Survey of the rest of the abdomen, including the omentum and right diaphragm was negative. Further staging not performed due to patient's refusal to accept blood products as she is a Jehovah's witness. Path showed 2.6 cm grade 1 endometrial cancer without LVSI or cervical involvement. The ovaries both were found to have low grade serous cancers of 7 and 8 mm. No spread noted, but washings were positive.  UTERUS, CERVIX, BILATERAL FALLOPIAN TUBES AND OVARIES; TOTAL  LAPAROSCOPIC HYSTERECTOMY AND BILATERAL SALPINGO-OOPHORECTOMY:  - WELL-DIFFERENTIATED ENDOMETRIOID CARCINOMA (FIGO I).  - BILATERAL OVARIES WITH LOW-GRADE SEROUS CARCINOMA.  - FALLOPIAN TUBES NEGATIVE FOR MALIGNANCY.  UTERINE TUMOR  Histologic Type:  Endometrioid adenocarcinoma, not otherwise  characterized  Histologic Grade:  FIGO grade 1  Tumor Size: 2.6cm  Myometrial Invasion:   Present  Depth of Myometrial Invasion: 8mm  Myometrial Thickness (mm):  7mm  Involvement of Cervix:  Not involved  Lymph-Vascular Invasion: Not identified   OVARIAN TUMOR Histologic Type:  Serous, carcinoma  Histologic Grade  World Health Organization [employer]) Grading System:  G2: Moderately differentiated  Two-Tier Grading System: Low grade  Tumor Size:  Right Ovary Tumor Size. Greatest dimension (cm) 0.8cm  Left Ovary Tumor Size. Greatest dimension (cm) 0.7cm  Ovarian Surface Involvement: Present  Lymph-Vascular Invasion: Not identified  Oncology History:  Problem List: Patient Active Problem List   Diagnosis Date Noted    . Endometrial cancer 11/04/2014  . S/P laparoscopic hysterectomy 11/04/2014            Ovarian carcinoma, unspecified laterality ([place])   11/25/2014 Initial Diagnosis    Ovarian cancer, bilateral     Subjective Data:  ECOG: 1 - Symptomatic but completely ambulatory  Subjective:     Elizabeth Davenport is a 62 y.o. female who presents for evaluation of abdominal pain and bloating. Onset was 3 days ago. Symptoms have been gradually worsening. The pain is described as pressure-like, and is 5/10 in intensity. Pain is located in the LUQ and epigastric region without radiation.  Aggravating factors: none.  Alleviating factors: none. Associated symptoms: none. The patient denies constipation, fever, melena, nausea, sweats and vomiting.  The patient's history has been marked as reviewed and updated as appropriate. allergies, current medications, past family history, past medical history, past social history, past surgical history and problem list  Review of Systems A comprehensive review of systems was negative except for: Constitutional: positive for fatigue Gastrointestinal: positive for abdominal pain and abdominal bloating Musculoskeletal: positive for muscle weakness and neck pain     Objective:    BP 114/77 (Patient Position: Sitting)   Pulse (!) 102   Temp 98.1 F (36.7 C) (Tympanic)   Resp 18   Wt 186 lb (84.4 kg)   LMP 11/04/2011   SpO2 95%   BMI 35.14 kg/m  General appearance: alert, cooperative, fatigued, no distress and moderately obese Neck: no adenopathy, no carotid bruit, no JVD, supple, symmetrical, trachea midline, thyroid not enlarged, symmetric, no tenderness/mass/nodules and Cervical neck pain Back: symmetric, no curvature. ROM normal. No CVA tenderness. Lungs: clear to auscultation bilaterally Heart: regular rate and rhythm, S1, S2 normal, no murmur, click, rub or gallop Abdomen: abnormal findings:  distended and hypoactive bowel sounds    Assessment:     Abdominal pain, likely secondary to fluid collection d/t progressive malignancy.     Plan:    The diagnosis was discussed with the patient and evaluation and treatment plans outlined. See orders for lab and imaging studies.    Stage Ia grade 1 endometrial cancer: Diagnosed with low-grade serous cancer s/p laparoscopic omentectomy 12/13/2017 with optimal debulking surgery.  S/p 3 cycles of adjuvant carbo/Taxol on 02/26/2018.  Started on letrozole but on 04/01/2018 called the clinic and refused to take d/t progressive bony and low back pain.  Started on anastrozole and is tolerating well to date.    Abdominal bloating/pain: On assessment, patient's abdomen is distended.  Reproducible 2/10 pain to left upper and lower quadrant.  Hypoactive bowel sounds.  No organomegaly.  Has no prior history of paracentesis.  Most recent CT scan from 04/05/2018 revealed increased density in the pelvis likely representing free fluid related to metastatic disease. Given acute findings, will get stat ultrasound with possible paracentesis.   Acute cervical neck pain: No previous history of neck injury or pain.  No lymphadenopathy.  Denies injury.  Denies physical activity or heavy lifting.  No obvious deformity on exam.  Mild reproducible pain with assessment.  No decreased range of motion.  Provocative maneuvers for cervical radiculopathy are negative.  No red flag symptoms except for history of cancer.  Consulted with Dr. Grayland Ormond, and it would be unlikely for this type of cancer to metastasize to C-spine.  Will treat conservatively at this time.  If symptoms persist will proceed with imaging of cervical/thoracic  spine.  Plan: Stat ultrasound of abdomen possible paracentesis.  Will send fluid for cytology if present.  Stat Ca 125.  Her Ca 125 has slowly been trending up. If Ca 125 continues to rise, will get CT abdomen/pelvis ASAP.  Patient and daughter in agreement with plan. Cervical neck pain.  Will treat conservatively  with NSAIDS and heat/ice packs.  Education provided and attached to AVS.  If neck pain does not improve or worsen over the next 48 hours, will get imaging.  Greater than 50% was spent in counseling and coordination of care with this patient including but not limited to discussion of the relevant topics above (See A&P) including, but not limited to diagnosis and management of acute and chronic medical conditions.   Faythe Casa, NP 06/03/2018 2:28 PM

## 2018-06-03 NOTE — Patient Instructions (Signed)
It was nice to meet you today.   We will get a stat ultrasound of your abdomen to make sure there is no fluid accumulating.  This is scheduled for today at 2:00.  I will call you and let you know the results.  We also will be getting lab work today.  This will include your Ca1 25; tumor marker.  Previous tumor marker was 31 which is considered elevated.  If your Ca 125 continues to trend up, Dr. Grayland Ormond does recommend a CT scan of your abdomen prior to the one scheduled in January.       Dolor muscular en los adultos (Muscle Pain, Adult) El dolor muscular (mialgia) puede ser leve o intenso. La mayora de las veces, el dolor dura solo un corto perodo y desaparece sin tratamiento. Es normal sentir algo de Marketing executive despus de comenzar un programa de entrenamiento. Generalmente duelen aquellos msculos que no se utilizan con frecuencia. Puede haber muchas otras causas del dolor muscular, que incluyen las siguientes:  Uso excesivo del msculo o distensin muscular, en especial si la persona no est en buen estado fsico. Esta es la causa ms comn del dolor muscular.  Lesiones.  [place].  Virus, como el de la gripe.  Enfermedades infecciosas.  Una afeccin crnica que causa dolor de [place], fatiga y dolor muscular con la palpacin (fibromialgia).  Una afeccin, como el lupus, en la que el sistema del cuerpo encargado de combatir las enfermedades ataca a otros rganos (enfermedades autoinmunes o Development worker, community).  Determinados medicamentos, como los inhibidores de la ECA y las estatinas. Para diagnosticar la causa del Marketing executive, su Special educational needs teacher un examen fsico y le har preguntas sobre el dolor y cundo comenz. Si el dolor muscular no comenz hace mucho tiempo, el mdico puede esperar antes de hacer diversas pruebas. Si el dolor muscular comenz hace mucho tiempo, el mdico puede decidir que es ms conveniente hacer pruebas de inmediato. En algunos casos, se pueden  realizar pruebas para descartar ciertas afecciones o enfermedades. El tratamiento del dolor muscular depende de la causa. El cuidado en el hogar a menudo es suficiente para Stage manager. El mdico tambin puede recetarle un medicamento antiinflamatorio. INSTRUCCIONES PARA EL CUIDADO EN EL HOGAR Actividad  Si el uso excesivo del msculo est provocando dolor muscular: ? [place] sus actividades hasta que el dolor desaparezca. ? Si usted no hace actividad fsica con frecuencia, los ejercicios deben ser suaves y regulares. ? Realice precalentamiento antes de la actividad fsica. Elongue antes y despus de hacer [place]. Esto puede ayudar a International aid/development worker de que sufra dolor muscular.  No siga haciendo actividad fsica si el dolor es muy intenso. Este tipo de dolor podra indicar que se ha lesionado un msculo. Control del dolor y de las molestias  Si se lo indican, aplique hielo sobre el msculo dolorido: ? Field seismologist hielo en una bolsa plstica. ? Coloque una [employer] piel y la bolsa de hielo. ? Coloque el hielo durante 76minutos, 2 a 3veces por da.  Tambin puede alternar [employer] aplicar hielo y Presenter, broadcasting como se lo haya indicado el mdico. Para aplicar calor, use la fuente de calor que el mdico le recomiende, como una compresa de calor hmedo o una almohadilla trmica. ? Coloque una [employer] piel y la fuente de Freight forwarder. ? Aplique el calor durante 20 a 88minutos. ? Retire la fuente de calor si la piel se le pone de color rojo brillante. Esto es  muy importante si no puede sentir el dolor, el calor ni el fro. Puede correr un riesgo mayor de sufrir quemaduras. Medicamentos  [employer] de venta libre y los recetados solamente como se lo haya indicado el mdico.  No conduzca ni use maquinaria pesada mientras toma analgsicos recetados. SOLICITE ATENCIN MDICA SI:  El Marketing executive empeora y los medicamentos no surten [place].  Tiene dolor  muscular que dura ms de 3das.  Tiene una erupcin cutnea o fiebre junto con el dolor muscular.  Tiene dolor muscular despus de una picadura de garrapata.  Tiene dolor muscular mientras hace actividad fsica, aunque est en buen estado fsico.  Tiene enrojecimiento, sensibilidad o hinchazn junto con el dolor muscular.  Tiene dolor muscular despus de comenzar un medicamento nuevo o de cambiar la dosis de un medicamento. SOLICITE ATENCIN MDICA DE INMEDIATO SI:  Tiene dificultad para respirar.  Presenta dificultad para tragar.  Tiene dolor muscular junto con rigidez en el cuello, fiebre y vmitos.  Tiene debilidad muscular intensa o no puede mover una parte del cuerpo. Esta informacin no tiene Marine scientist el consejo del mdico. Asegrese de hacerle al mdico cualquier pregunta que tenga. Document Released: 09/19/2007 Document Revised: 10/04/2015 Document Reviewed: 11/02/2015 Elsevier Interactive Patient Education  2018 [place], NP 06/03/2018 11:26 AM

## 2018-06-03 NOTE — Telephone Encounter (Signed)
Patient was seen by Faythe Casa, NP and patient was scheduled to have a paracentesis done at 2:30 pm.

## 2018-06-03 NOTE — Telephone Encounter (Signed)
Daughter called reporting that patient abdomen has been bloating times 1 week and a couple months ago, she went to ER with same and was told she had fluid build up and they drained it off, she is wandering if she needs it draw off again. Appointment accepted for 10 AM

## 2018-06-04 ENCOUNTER — Other Ambulatory Visit: Payer: Self-pay | Admitting: Oncology

## 2018-06-04 DIAGNOSIS — C569 Malignant neoplasm of unspecified ovary: Secondary | ICD-10-CM

## 2018-06-04 LAB — CA 125: Cancer Antigen (CA) 125: 134 U/mL — ABNORMAL HIGH (ref 0.0–38.1)

## 2018-06-04 NOTE — Progress Notes (Signed)
FYI. Ca 125 elevated. Will get her set up for CT abdomen/pelvis ASAP per our discussion.  Faythe Casa, NP 06/04/2018 9:45 AM

## 2018-06-04 NOTE — Progress Notes (Signed)
Patient had 1.9 L of fluid removed from abdomen.  Sent for cytology.  Ca 125 continues to be elevated.  Per discussion with Dr. Grayland Ormond, have ordered CT abdomen/pelvis/chest.  We will have her return to clinic after scans for results with Dr. Grayland Ormond.   Faythe Casa, NP 06/04/2018 9:53 AM

## 2018-06-04 NOTE — Progress Notes (Signed)
Will get CT scan ASAP.   Faythe Casa, NP 06/04/2018 9:45 AM

## 2018-06-05 ENCOUNTER — Other Ambulatory Visit: Payer: Self-pay | Admitting: Oncology

## 2018-06-06 ENCOUNTER — Ambulatory Visit
Admission: RE | Admit: 2018-06-06 | Discharge: 2018-06-06 | Disposition: A | Discharge status: Home / Self Care | Payer: Self-pay | Source: Ambulatory Visit | Attending: Oncology | Admitting: Oncology

## 2018-06-06 DIAGNOSIS — C569 Malignant neoplasm of unspecified ovary: Secondary | ICD-10-CM | POA: Insufficient documentation

## 2018-06-06 LAB — CYTOLOGY - NON PAP

## 2018-06-06 MED ORDER — IOPAMIDOL (ISOVUE-300) INJECTION 61%
100.0000 mL | Freq: Once | INTRAVENOUS | Status: AC | PRN
  Administered 2018-06-06: 100 mL via INTRAVENOUS

## 2018-06-06 NOTE — Progress Notes (Signed)
It looks like you are seeing her tomorrow.   Faythe Casa, NP 06/06/2018 3:10 PM

## 2018-06-07 ENCOUNTER — Other Ambulatory Visit: Payer: Self-pay

## 2018-06-07 ENCOUNTER — Inpatient Hospital Stay (HOSPITAL_BASED_OUTPATIENT_CLINIC_OR_DEPARTMENT_OTHER): Payer: Self-pay | Admitting: Oncology

## 2018-06-07 VITALS — BP 119/80 | HR 84 | Temp 97.6°F | Ht 61.0 in | Wt 181.2 lb

## 2018-06-07 DIAGNOSIS — Z5111 Encounter for antineoplastic chemotherapy: Secondary | ICD-10-CM

## 2018-06-07 DIAGNOSIS — M545 Low back pain: Secondary | ICD-10-CM

## 2018-06-07 DIAGNOSIS — Z90710 Acquired absence of both cervix and uterus: Secondary | ICD-10-CM

## 2018-06-07 DIAGNOSIS — C786 Secondary malignant neoplasm of retroperitoneum and peritoneum: Secondary | ICD-10-CM

## 2018-06-07 DIAGNOSIS — R791 Abnormal coagulation profile: Secondary | ICD-10-CM

## 2018-06-07 DIAGNOSIS — Z79899 Other long term (current) drug therapy: Secondary | ICD-10-CM

## 2018-06-07 DIAGNOSIS — Z79811 Long term (current) use of aromatase inhibitors: Secondary | ICD-10-CM

## 2018-06-07 DIAGNOSIS — Z87891 Personal history of nicotine dependence: Secondary | ICD-10-CM

## 2018-06-07 DIAGNOSIS — Z803 Family history of malignant neoplasm of breast: Secondary | ICD-10-CM

## 2018-06-07 DIAGNOSIS — F419 Anxiety disorder, unspecified: Secondary | ICD-10-CM

## 2018-06-07 DIAGNOSIS — C569 Malignant neoplasm of unspecified ovary: Secondary | ICD-10-CM

## 2018-06-07 DIAGNOSIS — R599 Enlarged lymph nodes, unspecified: Secondary | ICD-10-CM

## 2018-06-07 DIAGNOSIS — Z90722 Acquired absence of ovaries, bilateral: Secondary | ICD-10-CM

## 2018-06-07 DIAGNOSIS — R109 Unspecified abdominal pain: Secondary | ICD-10-CM

## 2018-06-07 DIAGNOSIS — M542 Cervicalgia: Secondary | ICD-10-CM

## 2018-06-07 DIAGNOSIS — C561 Malignant neoplasm of right ovary: Secondary | ICD-10-CM

## 2018-06-07 DIAGNOSIS — K5730 Diverticulosis of large intestine without perforation or abscess without bleeding: Secondary | ICD-10-CM

## 2018-06-07 DIAGNOSIS — C541 Malignant neoplasm of endometrium: Secondary | ICD-10-CM

## 2018-06-07 DIAGNOSIS — K7689 Other specified diseases of liver: Secondary | ICD-10-CM

## 2018-06-07 DIAGNOSIS — R188 Other ascites: Secondary | ICD-10-CM

## 2018-06-07 NOTE — Progress Notes (Signed)
[place]  [phone]) [phone] [phone]  ID: Kirstine Jacquin OB: 1956-02-23  MR#: [id number]  DTO#:[id number]  Patient Care Team: Letta Median, MD as PCP - General (Family Medicine) Clent Jacks, RN as Registered Nurse Rico Junker, RN as Registered Nurse Theodore Demark, RN as Registered Nurse  CHIEF COMPLAINT: Progressive low-grade ovarian malignancy  INTERVAL HISTORY: Patient returns to clinic today for further evaluation, discussion of her imaging results, and treatment planning.  She currently feels well and is asymptomatic.  She does not complain of any further abdominal pain or bloating. She has no neurologic complaints.  She denies any fevers.  She has a fair appetite and denies weight loss.  She has no chest pain or shortness of breath. She denies any nausea, vomiting, constipation, or diarrhea.  She has no urinary complaints.  Patient feels at her baseline offers no specific complaints today.  REVIEW OF SYSTEMS:   Review of Systems  Constitutional: Negative.  Negative for fever, malaise/fatigue and weight loss.  Respiratory: Negative.  Negative for cough and shortness of breath.   Cardiovascular: Negative.  Negative for chest pain and leg swelling.  Gastrointestinal: Negative.  Negative for abdominal pain, constipation, diarrhea, nausea and vomiting.  Genitourinary: Negative.  Negative for dysuria.  Musculoskeletal: Negative.  Negative for back pain.  Skin: Negative.  Negative for rash.  Neurological: Negative.  Negative for sensory change, focal weakness and weakness.  Psychiatric/Behavioral: Negative.  The patient is not nervous/anxious.     As per HPI. Otherwise, a complete review of systems is negative.  PAST MEDICAL HISTORY: Past Medical History:  Diagnosis Date  . Arthritis    left knee  . Asthma   . Endometrial cancer ([place]) 11/04/14  . PONV (postoperative nausea and vomiting)     PAST SURGICAL HISTORY: Past  Surgical History:  Procedure Laterality Date  . ABDOMINAL HYSTERECTOMY    . LAPAROSCOPIC BILATERAL SALPINGO OOPHERECTOMY Bilateral 11/04/2014   Procedure: LAPAROSCOPIC BILATERAL SALPINGO OOPHORECTOMY;  Surgeon: Mellody Drown, MD;  Location: [facility] ORS;  Service: Gynecology;  Laterality: Bilateral;  . LAPAROSCOPIC HYSTERECTOMY N/A 11/04/2014   Procedure: HYSTERECTOMY TOTAL LAPAROSCOPIC;  Surgeon: Mellody Drown, MD;  Location: [facility] ORS;  Service: Gynecology;  Laterality: N/A;  . removed mass from thyriod      FAMILY HISTORY: Family History  Problem Relation Age of Onset  . Breast cancer Sister 42  . Asthma Mother   . Coronary artery disease Mother   . Diabetes Brother   . Diabetes Sister   . Diabetes Sister   . Diabetes Brother     ADVANCED DIRECTIVES (Y/N):  N  HEALTH MAINTENANCE: Social History   Tobacco Use  . Smoking status: Former Smoker    Packs/day: 0.25    Years: 8.00    Pack years: 2.00    Types: Cigarettes    Last attempt to quit: 06/29/1990    Years since quitting: 27.9  . Smokeless tobacco: Never Used  . Tobacco comment: Smoking History Cigarettes 1 cig per day; quit 22 years ago; smoked for 8 years  Substance Use Topics  . Alcohol use: No    Alcohol/week: 0.0 standard drinks    Comment: occasional   . Drug use: No     Colonoscopy:  PAP:  Bone density:  Lipid panel:  No Known Allergies  Current Outpatient Medications  Medication Sig Dispense Refill  . acetaminophen (TYLENOL) 500 MG tablet Take 500 mg by mouth every 6 (six) hours as needed.    Marland Kitchen  albuterol (PROVENTIL HFA;VENTOLIN HFA) 108 (90 BASE) MCG/ACT inhaler Inhale 2 puffs into the lungs every 6 (six) hours as needed for wheezing or shortness of breath.    . exemestane (AROMASIN) 25 MG tablet Take 1 tablet (25 mg total) by mouth daily after breakfast. 30 tablet 3  . fluticasone (FLOVENT HFA) 220 MCG/ACT inhaler Take by mouth.    . ondansetron (ZOFRAN ODT) 4 MG disintegrating tablet Take 1 tablet (4  mg total) by mouth every 8 (eight) hours as needed for nausea or vomiting. (Patient not taking: Reported on 03/19/2018) 20 tablet 0  . ondansetron (ZOFRAN) 8 MG tablet Take 1 tablet (8 mg total) by mouth 2 (two) times daily as needed for refractory nausea / vomiting. (Patient not taking: Reported on 03/19/2018) 30 tablet 2  . prochlorperazine (COMPAZINE) 10 MG tablet Take 1 tablet (10 mg total) by mouth every 6 (six) hours as needed (Nausea or vomiting). (Patient not taking: Reported on 03/19/2018) 60 tablet 2   No current facility-administered medications for this visit.     OBJECTIVE: Vitals:   06/07/18 0949  BP: 119/80  Pulse: 84  Temp: 97.6 F (36.4 C)     Body mass index is 34.24 kg/m.    ECOG FS:0 - Asymptomatic  General: Well-developed, well-nourished, no acute distress. Eyes: Pink conjunctiva, anicteric sclera. HEENT: Normocephalic, moist mucous membranes, clear oropharnyx. Lungs: Clear to auscultation bilaterally. Heart: Regular rate and rhythm. No rubs, murmurs, or gallops. Abdomen: Soft, nontender, nondistended. No organomegaly noted, normoactive bowel sounds. Musculoskeletal: No edema, cyanosis, or clubbing. Neuro: Alert, answering all questions appropriately. Cranial nerves grossly intact. Skin: No rashes or petechiae noted. Psych: Normal affect.  LAB RESULTS:  Lab Results  Component Value Date   NA 139 05/10/2018   K 4.2 05/10/2018   CL 104 05/10/2018   CO2 26 05/10/2018   GLUCOSE 121 (H) 05/10/2018   BUN 9 05/10/2018   CREATININE 0.48 05/10/2018   CALCIUM 8.4 (L) 05/10/2018   PROT 7.2 05/10/2018   ALBUMIN 3.0 (L) 05/10/2018   AST 13 (L) 05/10/2018   ALT 12 05/10/2018   ALKPHOS 50 05/10/2018   BILITOT 0.4 05/10/2018   GFRNONAA >60 05/10/2018   GFRAA >60 05/10/2018    Lab Results  Component Value Date   WBC 6.2 05/10/2018   NEUTROABS 4.2 05/10/2018   HGB 9.1 (L) 05/10/2018   HCT 29.6 (L) 05/10/2018   MCV 86.5 05/10/2018   PLT 359 05/10/2018      STUDIES: Ct Chest W Contrast  Result Date: 06/06/2018 CLINICAL DATA:  62 year old with personal history of grade 1 endometrial cancer diagnosed in 2016 for which the patient underwent TAH-BSO; 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 She had omental recurrence in May, 2019 for which she is being treated with anastrozole. She presents now with rising CA-125 levels. EXAM: CT CHEST, ABDOMEN, AND PELVIS WITH CONTRAST TECHNIQUE: Multidetector CT imaging of the chest, abdomen and pelvis was performed following the standard protocol during bolus administration of intravenous contrast. CONTRAST:  165mL ISOVUE-300 IOPAMIDOL INJECTION 61% IV. Oral contrast was also administered. COMPARISON:  CT abdomen and pelvis 04/05/2018, 10/31/2017, 09/22/2014. No prior chest CT. FINDINGS: CT CHEST FINDINGS Cardiovascular: Normal heart size. No pericardial effusion. No visible coronary atherosclerosis. No visible atherosclerosis involving the thoracic aorta or the proximal great vessels. Mediastinum/Nodes: LOWER mediastinal lymph nodes in the paracardiac fat adjacent to the RIGHT side of the heart, at least 2 of which have increased slightly in size since the 04/05/2018 CT.  No pathologically enlarged lymph nodes elsewhere in the mediastinum, hila or axilla. No mediastinal masses. Normal-appearing esophagus. Visualized thyroid gland normal in appearance. Lungs/Pleura: Minimal linear scarring in the lower lobes and in the inferior right upper lobe. Pulmonary parenchyma otherwise clear without localized airspace consolidation, interstitial disease, or parenchymal nodules or masses. Central airways patent without significant bronchial wall thickening. No pleural effusions. No pleural plaques or masses. Musculoskeletal: Regional skeleton unremarkable without acute or significant osseous abnormality. No evidence of osseous metastatic disease. Other: Approximate 1.4 cm subcutaneous mass involving the  upper back visible on the initial image. CT ABDOMEN PELVIS FINDINGS Hepatobiliary: Multiple hepatic cysts as noted on prior examinations. No new or suspicious hepatic masses. Gallbladder normal in appearance without calcified gallstones. No biliary ductal dilation. Pancreas: Normal in appearance without evidence of mass, ductal dilation, or inflammation. Spleen: Normal in size and appearance. Adrenals/Urinary Tract: Normal appearing adrenal glands. Approximate 4 cm simple cyst arising from the mid RIGHT kidney as noted previously. No significant parenchymal abnormality involving either kidney. No hydronephrosis. No urinary tract calculi. Normal appearing decompressed urinary bladder. Stomach/Bowel: Stomach normal in appearance for the degree of distention. Normal-appearing small bowel. Descending colon, sigmoid colon and rectum completely decompressed. Sigmoid colon diverticulosis without evidence of acute diverticulitis. Remainder of the colon normal in appearance with moderate stool burden in the ascending and transverse colon. Normal appearing gas-filled appendix in the RIGHT UPPER pelvis extending to the midline. Vascular/Lymphatic: Mild aortic atherosclerosis without evidence of aneurysm. Retroaortic LEFT renal vein again noted. Normal-appearing portal venous and systemic venous systems. Mildly enlarged LEFT periaortic lymph node at the level of the renal hila measuring approximately 1.7 cm, increased in size since the 04/05/2018 CT. Numerous small lymph nodes elsewhere in the abdomen including the gastrohepatic ligament and the porta hepatis. Reproductive: Surgically absent uterus and ovaries. No visible adnexal masses. Other: Moderate amount of ascites, increased since the 04/05/2018 CT, including interloop ascites in the mesentery. Progression of omental, peritoneal and mesenteric metastatic disease since the 04/05/2018 CT. Musculoskeletal: Regional skeleton unremarkable without acute or significant osseous  abnormality. No evidence of osseous metastatic disease. IMPRESSION: 1. Mildly enlarged lymph nodes in the LOWER mediastinum in the RIGHT paracardiac location, some of which which have slightly increased in size since the CT 04/05/2018. 2. No acute cardiopulmonary disease. 3. Moderate amount of ascites, increased since the CT 04/05/2018. 4. Mildly enlarged LEFT periaortic lymph node at the level of the renal hila, measured above. Numerous small lymph nodes elsewhere in the abdomen including the gastrohepatic ligament and porta hepatis. These nodes have increased in size since the CT 04/05/2018 and indicate metastatic disease. 5. Progression of omental, peritoneal and mesenteric metastatic disease since the CT 04/05/2018. Electronically Signed   By: Evangeline Dakin M.D.   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 She had omental recurrence in May, 2019 for which she is being treated with anastrozole. She presents now with rising CA-125 levels. EXAM: CT CHEST, ABDOMEN, AND PELVIS WITH CONTRAST TECHNIQUE: Multidetector CT imaging of the chest, abdomen and pelvis was performed following the standard protocol during bolus administration of intravenous contrast. CONTRAST:  119mL ISOVUE-300 IOPAMIDOL INJECTION 61% IV. Oral contrast was also administered. COMPARISON:  CT abdomen and pelvis 04/05/2018, 10/31/2017, 09/22/2014. No prior chest CT. FINDINGS: CT CHEST FINDINGS  Cardiovascular: Normal heart size. No pericardial effusion. No visible coronary atherosclerosis. No visible atherosclerosis involving the thoracic aorta or the proximal great vessels. Mediastinum/Nodes: LOWER mediastinal lymph nodes in the paracardiac fat adjacent to the RIGHT side of the heart, at least 2  of which have increased slightly in size since the 04/05/2018 CT. No pathologically enlarged lymph nodes elsewhere in the mediastinum, hila or axilla. No mediastinal masses. Normal-appearing esophagus. Visualized thyroid gland normal in appearance. Lungs/Pleura: Minimal linear scarring in the lower lobes and in the inferior right upper lobe. Pulmonary parenchyma otherwise clear without localized airspace consolidation, interstitial disease, or parenchymal nodules or masses. Central airways patent without significant bronchial wall thickening. No pleural effusions. No pleural plaques or masses. Musculoskeletal: Regional skeleton unremarkable without acute or significant osseous abnormality. No evidence of osseous metastatic disease. Other: Approximate 1.4 cm subcutaneous mass involving the upper back visible on the initial image. CT ABDOMEN PELVIS FINDINGS Hepatobiliary: Multiple hepatic cysts as noted on prior examinations. No new or suspicious hepatic masses. Gallbladder normal in appearance without calcified gallstones. No biliary ductal dilation. Pancreas: Normal in appearance without evidence of mass, ductal dilation, or inflammation. Spleen: Normal in size and appearance. Adrenals/Urinary Tract: Normal appearing adrenal glands. Approximate 4 cm simple cyst arising from the mid RIGHT kidney as noted previously. No significant parenchymal abnormality involving either kidney. No hydronephrosis. No urinary tract calculi. Normal appearing decompressed urinary bladder. Stomach/Bowel: Stomach normal in appearance for the degree of distention. Normal-appearing small bowel. Descending colon, sigmoid colon and rectum completely decompressed. Sigmoid colon diverticulosis without evidence of acute diverticulitis. Remainder of the colon normal in appearance with moderate stool burden in the ascending and transverse colon. Normal appearing gas-filled appendix in the RIGHT UPPER pelvis extending to the midline.  Vascular/Lymphatic: Mild aortic atherosclerosis without evidence of aneurysm. Retroaortic LEFT renal vein again noted. Normal-appearing portal venous and systemic venous systems. Mildly enlarged LEFT periaortic lymph node at the level of the renal hila measuring approximately 1.7 cm, increased in size since the 04/05/2018 CT. Numerous small lymph nodes elsewhere in the abdomen including the gastrohepatic ligament and the porta hepatis. Reproductive: Surgically absent uterus and ovaries. No visible adnexal masses. Other: Moderate amount of ascites, increased since the 04/05/2018 CT, including interloop ascites in the mesentery. Progression of omental, peritoneal and mesenteric metastatic disease since the 04/05/2018 CT. Musculoskeletal: Regional skeleton unremarkable without acute or significant osseous abnormality. No evidence of osseous metastatic disease. IMPRESSION: 1. Mildly enlarged lymph nodes in the LOWER mediastinum in the RIGHT paracardiac location, some of which which have slightly increased in size since the CT 04/05/2018. 2. No acute cardiopulmonary disease. 3. Moderate amount of ascites, increased since the CT 04/05/2018. 4. Mildly enlarged LEFT periaortic lymph node at the level of the renal hila, measured above. Numerous small lymph nodes elsewhere in the abdomen including the gastrohepatic ligament and porta hepatis. These nodes have increased in size since the CT 04/05/2018 and indicate metastatic disease. 5. Progression of omental, peritoneal and mesenteric metastatic disease since the CT 04/05/2018. Electronically Signed   By: Evangeline Dakin M.D.   On: 06/06/2018 12:08   US Paracentesis  Result Date: 06/03/2018 INDICATION: 62 year old female with suspected malignant ascites. She presents for diagnostic and therapeutic paracentesis. EXAM: ULTRASOUND GUIDED  PARACENTESIS MEDICATIONS: None. COMPLICATIONS: None immediate. PROCEDURE: Informed written consent was obtained from the patient after a  discussion of the risks, benefits and alternatives to treatment. A timeout was performed prior to the initiation of the procedure. Initial ultrasound scanning demonstrates a  moderate amount of ascites within the right lower abdominal quadrant. The right lower abdomen was prepped and draped in the usual sterile fashion. 1% lidocaine with epinephrine was used for local anesthesia. Following this, a 6 Fr Safe-T-Centesis catheter was introduced. An ultrasound image was saved for documentation purposes. The paracentesis was performed. The catheter was removed and a dressing was applied. The patient tolerated the procedure well without immediate post procedural complication. FINDINGS: A total of approximately 1900 mL of amber colored ascitic fluid was removed. Samples were sent to the laboratory as requested by the clinical team. IMPRESSION: Successful ultrasound-guided paracentesis yielding 1.9 liters of peritoneal fluid. Electronically Signed   By: Jacqulynn Cadet M.D.   On: 06/03/2018 16:21    ASSESSMENT: Progressive low-grade ovarian malignancy. PLAN:   1.  Progressive low-grade ovarian malignancy: CT scan results from June 06, 2018 reviewed independently and reported as above with clear progression of disease.  Patient's CA-125 is also increased at 134.0.  Finally, fluid from paracentesis was positive for malignant cells.  Previously, patient underwent optimal surgical debulking and then completed 3 cycles of adjuvant carboplatinum and Taxol on February 26, 2018.  Patient was initially placed on maintenance letrozole, but could not tolerate treatment and this was switched to Aromasin.  After lengthy discussion with the patient, it was recommended that she reinitiate chemotherapy with carboplatinum and Taxol.  Hospice and end-of-life care were also discussed.  Patient is on sure if she wishes to pursue additional treatment and will call clinic on Monday with her decision.  If patient opts for retreatment,  she will return to clinic on Wednesday, June 12, 2018 for further evaluation and consideration of carboplatinum and Taxol. 2.  History of endometrial cancer: Patient had total hysterectomy.  Imaging as above. 3.  Anemia: Patient's most recent hemoglobin is 9.1.  Monitor. 4.  Leukopenia: Resolved.   5.  Anxiety: Continue Xanax as needed.  The entire visit was done in the presence of an interpreter.  I spent a total of 30 minutes face-to-face with the patient of which greater than 50% of the visit was spent in counseling and coordination of care as detailed above.   Patient expressed understanding and was in agreement with this plan. She also understands that She can call clinic at any time with any questions, concerns, or complaints.   Cancer Staging Borderline epithelial neoplasm of ovary Staging form: Ovary, Fallopian Tube, and Primary Peritoneal Carcinoma, AJCC 8th Edition - Clinical stage from 01/11/2018: FIGO Stage III (cT3, cN0, cM0) - Signed by Lloyd Huger, MD on 01/11/2018  Endometrial cancer [facility]) Staging form: Corpus Uteri - Carcinoma, AJCC 7th Edition - Clinical: Stage I (T1, N0, M0) - Unsigned  Ovarian carcinoma, unspecified laterality ([place]) Staging form: Ovary, AJCC 7th Edition - Clinical: Stage Unknown (T1c, NX, M0) - Signed by Forest Gleason, MD on 11/27/2014   Lloyd Huger, MD   06/07/2018 10:42 AM

## 2018-06-07 NOTE — Progress Notes (Signed)
Patient is here to follow up on her ovary cancer. Patient stated that she feels tired but no pain. Patient denied fever, chills, constipation and diarrhea.

## 2018-06-10 ENCOUNTER — Telehealth: Payer: Self-pay | Admitting: *Deleted

## 2018-06-10 ENCOUNTER — Other Ambulatory Visit: Payer: Self-pay | Admitting: Oncology

## 2018-06-10 NOTE — Telephone Encounter (Signed)
Patient will be seen this week to start her chemotherapy again.

## 2018-06-10 NOTE — Telephone Encounter (Signed)
Daughter called requesting that Herb Grays return her call

## 2018-06-14 ENCOUNTER — Inpatient Hospital Stay: Payer: Self-pay

## 2018-06-14 VITALS — BP 121/81 | HR 91 | Temp 97.7°F | Resp 18 | Wt 180.0 lb

## 2018-06-14 DIAGNOSIS — D3910 Neoplasm of uncertain behavior of unspecified ovary: Secondary | ICD-10-CM

## 2018-06-14 LAB — CBC WITH DIFFERENTIAL/PLATELET
Abs Immature Granulocytes: 0.01 10*3/uL (ref 0.00–0.07)
Basophils Absolute: 0 10*3/uL (ref 0.0–0.1)
Basophils Relative: 0 %
Eosinophils Absolute: 0.1 10*3/uL (ref 0.0–0.5)
Eosinophils Relative: 1 %
HCT: 30.4 % — ABNORMAL LOW (ref 36.0–46.0)
Hemoglobin: 9.1 g/dL — ABNORMAL LOW (ref 12.0–15.0)
Immature Granulocytes: 0 %
Lymphocytes Relative: 22 %
Lymphs Abs: 1.3 10*3/uL (ref 0.7–4.0)
MCH: 23.6 pg — AB (ref 26.0–34.0)
MCHC: 29.9 g/dL — ABNORMAL LOW (ref 30.0–36.0)
MCV: 79 fL — ABNORMAL LOW (ref 80.0–100.0)
MONO ABS: 0.6 10*3/uL (ref 0.1–1.0)
MONOS PCT: 10 %
Neutro Abs: 3.8 10*3/uL (ref 1.7–7.7)
Neutrophils Relative %: 67 %
Platelets: 509 10*3/uL — ABNORMAL HIGH (ref 150–400)
RBC: 3.85 MIL/uL — AB (ref 3.87–5.11)
RDW: 18.1 % — ABNORMAL HIGH (ref 11.5–15.5)
WBC: 5.7 10*3/uL (ref 4.0–10.5)
nRBC: 0 % (ref 0.0–0.2)

## 2018-06-14 LAB — COMPREHENSIVE METABOLIC PANEL
ALT: 13 U/L (ref 0–44)
AST: 16 U/L (ref 15–41)
Albumin: 3 g/dL — ABNORMAL LOW (ref 3.5–5.0)
Alkaline Phosphatase: 51 U/L (ref 38–126)
Anion gap: 13 (ref 5–15)
BUN: 7 mg/dL — ABNORMAL LOW (ref 8–23)
CO2: 25 mmol/L (ref 22–32)
Calcium: 9 mg/dL (ref 8.9–10.3)
Chloride: 101 mmol/L (ref 98–111)
Creatinine, Ser: 0.45 mg/dL (ref 0.44–1.00)
GFR calc Af Amer: >60 mL/min (ref >60)
GFR calc non Af Amer: >60 mL/min (ref >60)
GLUCOSE: 112 mg/dL — AB (ref 70–99)
Potassium: 3.9 mmol/L (ref 3.5–5.1)
Sodium: 139 mmol/L (ref 135–145)
Total Bilirubin: 0.6 mg/dL (ref 0.3–1.2)
Total Protein: 7.9 g/dL (ref 6.5–8.1)

## 2018-06-14 MED ORDER — SODIUM CHLORIDE 0.9 % IV SOLN
Freq: Once | INTRAVENOUS | Status: AC
  Administered 2018-06-14: 10:00:00 via INTRAVENOUS
  Filled 2018-06-14: qty 250

## 2018-06-14 MED ORDER — SODIUM CHLORIDE 0.9 % IV SOLN
750.0000 mg | Freq: Once | INTRAVENOUS | Status: AC
  Administered 2018-06-14: 750 mg via INTRAVENOUS
  Filled 2018-06-14: qty 75

## 2018-06-14 MED ORDER — FAMOTIDINE IN NACL 20-0.9 MG/50ML-% IV SOLN
20.0000 mg | Freq: Once | INTRAVENOUS | Status: AC
  Administered 2018-06-14: 20 mg via INTRAVENOUS
  Filled 2018-06-14: qty 50

## 2018-06-14 MED ORDER — DIPHENHYDRAMINE HCL 50 MG/ML IJ SOLN
25.0000 mg | Freq: Once | INTRAMUSCULAR | Status: AC
  Administered 2018-06-14: 25 mg via INTRAVENOUS
  Filled 2018-06-14: qty 1

## 2018-06-14 MED ORDER — SODIUM CHLORIDE 0.9 % IV SOLN
175.0000 mg/m2 | Freq: Once | INTRAVENOUS | Status: AC
  Administered 2018-06-14: 342 mg via INTRAVENOUS
  Filled 2018-06-14: qty 57

## 2018-06-14 MED ORDER — PALONOSETRON HCL INJECTION 0.25 MG/5ML
0.2500 mg | Freq: Once | INTRAVENOUS | Status: AC
  Administered 2018-06-14: 0.25 mg via INTRAVENOUS
  Filled 2018-06-14: qty 5

## 2018-06-14 MED ORDER — DEXAMETHASONE SODIUM PHOSPHATE 10 MG/ML IJ SOLN
10.0000 mg | Freq: Once | INTRAMUSCULAR | Status: AC
  Administered 2018-06-14: 10 mg via INTRAVENOUS
  Filled 2018-06-14: qty 1

## 2018-06-18 ENCOUNTER — Telehealth: Payer: Self-pay | Admitting: *Deleted

## 2018-06-18 ENCOUNTER — Inpatient Hospital Stay: Payer: Self-pay

## 2018-06-18 ENCOUNTER — Inpatient Hospital Stay (HOSPITAL_BASED_OUTPATIENT_CLINIC_OR_DEPARTMENT_OTHER): Payer: Self-pay | Admitting: Oncology

## 2018-06-18 ENCOUNTER — Other Ambulatory Visit: Payer: Self-pay

## 2018-06-18 ENCOUNTER — Other Ambulatory Visit: Payer: Self-pay | Admitting: *Deleted

## 2018-06-18 ENCOUNTER — Ambulatory Visit: Payer: Self-pay

## 2018-06-18 ENCOUNTER — Encounter: Payer: Self-pay | Admitting: Oncology

## 2018-06-18 VITALS — HR 91

## 2018-06-18 VITALS — BP 126/86 | HR 112 | Temp 97.2°F | Resp 18 | Wt 174.8 lb

## 2018-06-18 DIAGNOSIS — R531 Weakness: Secondary | ICD-10-CM

## 2018-06-18 DIAGNOSIS — R188 Other ascites: Secondary | ICD-10-CM

## 2018-06-18 DIAGNOSIS — M79661 Pain in right lower leg: Secondary | ICD-10-CM

## 2018-06-18 DIAGNOSIS — R109 Unspecified abdominal pain: Secondary | ICD-10-CM

## 2018-06-18 DIAGNOSIS — Z5111 Encounter for antineoplastic chemotherapy: Secondary | ICD-10-CM

## 2018-06-18 DIAGNOSIS — R110 Nausea: Secondary | ICD-10-CM

## 2018-06-18 DIAGNOSIS — C786 Secondary malignant neoplasm of retroperitoneum and peritoneum: Secondary | ICD-10-CM

## 2018-06-18 DIAGNOSIS — M79606 Pain in leg, unspecified: Secondary | ICD-10-CM

## 2018-06-18 DIAGNOSIS — M545 Low back pain: Secondary | ICD-10-CM

## 2018-06-18 DIAGNOSIS — Z79899 Other long term (current) drug therapy: Secondary | ICD-10-CM

## 2018-06-18 DIAGNOSIS — Z79811 Long term (current) use of aromatase inhibitors: Secondary | ICD-10-CM

## 2018-06-18 DIAGNOSIS — R599 Enlarged lymph nodes, unspecified: Secondary | ICD-10-CM

## 2018-06-18 DIAGNOSIS — M79662 Pain in left lower leg: Secondary | ICD-10-CM

## 2018-06-18 DIAGNOSIS — F419 Anxiety disorder, unspecified: Secondary | ICD-10-CM

## 2018-06-18 DIAGNOSIS — K5730 Diverticulosis of large intestine without perforation or abscess without bleeding: Secondary | ICD-10-CM

## 2018-06-18 DIAGNOSIS — C569 Malignant neoplasm of unspecified ovary: Secondary | ICD-10-CM

## 2018-06-18 DIAGNOSIS — Z5189 Encounter for other specified aftercare: Secondary | ICD-10-CM

## 2018-06-18 DIAGNOSIS — Z90722 Acquired absence of ovaries, bilateral: Secondary | ICD-10-CM

## 2018-06-18 DIAGNOSIS — G629 Polyneuropathy, unspecified: Secondary | ICD-10-CM

## 2018-06-18 DIAGNOSIS — I10 Essential (primary) hypertension: Secondary | ICD-10-CM

## 2018-06-18 DIAGNOSIS — K5900 Constipation, unspecified: Secondary | ICD-10-CM

## 2018-06-18 DIAGNOSIS — R971 Elevated cancer antigen 125 [CA 125]: Secondary | ICD-10-CM

## 2018-06-18 DIAGNOSIS — M542 Cervicalgia: Secondary | ICD-10-CM

## 2018-06-18 DIAGNOSIS — C541 Malignant neoplasm of endometrium: Secondary | ICD-10-CM

## 2018-06-18 DIAGNOSIS — Z90710 Acquired absence of both cervix and uterus: Secondary | ICD-10-CM

## 2018-06-18 DIAGNOSIS — K7689 Other specified diseases of liver: Secondary | ICD-10-CM

## 2018-06-18 DIAGNOSIS — Z803 Family history of malignant neoplasm of breast: Secondary | ICD-10-CM

## 2018-06-18 DIAGNOSIS — Z87891 Personal history of nicotine dependence: Secondary | ICD-10-CM

## 2018-06-18 DIAGNOSIS — C561 Malignant neoplasm of right ovary: Secondary | ICD-10-CM

## 2018-06-18 DIAGNOSIS — E871 Hypo-osmolality and hyponatremia: Secondary | ICD-10-CM

## 2018-06-18 DIAGNOSIS — R000 Tachycardia, unspecified: Secondary | ICD-10-CM

## 2018-06-18 DIAGNOSIS — D3910 Neoplasm of uncertain behavior of unspecified ovary: Secondary | ICD-10-CM

## 2018-06-18 LAB — CBC WITH DIFFERENTIAL/PLATELET
Abs Immature Granulocytes: 0.01 10*3/uL (ref 0.00–0.07)
BASOS PCT: 0 %
Basophils Absolute: 0 10*3/uL (ref 0.0–0.1)
Eosinophils Absolute: 0.1 10*3/uL (ref 0.0–0.5)
Eosinophils Relative: 1 %
HCT: 32.3 % — ABNORMAL LOW (ref 36.0–46.0)
Hemoglobin: 9.7 g/dL — ABNORMAL LOW (ref 12.0–15.0)
Immature Granulocytes: 0 %
Lymphocytes Relative: 21 %
Lymphs Abs: 0.9 10*3/uL (ref 0.7–4.0)
MCH: 23.5 pg — ABNORMAL LOW (ref 26.0–34.0)
MCHC: 30 g/dL (ref 30.0–36.0)
MCV: 78.2 fL — ABNORMAL LOW (ref 80.0–100.0)
Monocytes Absolute: 0.1 10*3/uL (ref 0.1–1.0)
Monocytes Relative: 1 %
Neutro Abs: 3.3 10*3/uL (ref 1.7–7.7)
Neutrophils Relative %: 77 %
Platelets: 457 10*3/uL — ABNORMAL HIGH (ref 150–400)
RBC: 4.13 MIL/uL (ref 3.87–5.11)
RDW: 18 % — AB (ref 11.5–15.5)
WBC: 4.4 10*3/uL (ref 4.0–10.5)
nRBC: 0 % (ref 0.0–0.2)

## 2018-06-18 LAB — COMPREHENSIVE METABOLIC PANEL
ALT: 14 U/L (ref 0–44)
AST: 18 U/L (ref 15–41)
Albumin: 3.2 g/dL — ABNORMAL LOW (ref 3.5–5.0)
Alkaline Phosphatase: 50 U/L (ref 38–126)
Anion gap: 9 (ref 5–15)
BUN: 14 mg/dL (ref 8–23)
CO2: 24 mmol/L (ref 22–32)
Calcium: 9.1 mg/dL (ref 8.9–10.3)
Chloride: 100 mmol/L (ref 98–111)
Creatinine, Ser: 0.36 mg/dL — ABNORMAL LOW (ref 0.44–1.00)
GFR calc Af Amer: >60 mL/min (ref >60)
GFR calc non Af Amer: >60 mL/min (ref >60)
Glucose, Bld: 127 mg/dL — ABNORMAL HIGH (ref 70–99)
Potassium: 4.3 mmol/L (ref 3.5–5.1)
Sodium: 133 mmol/L — ABNORMAL LOW (ref 135–145)
Total Bilirubin: 0.7 mg/dL (ref 0.3–1.2)
Total Protein: 8.6 g/dL — ABNORMAL HIGH (ref 6.5–8.1)

## 2018-06-18 LAB — MAGNESIUM: Magnesium: 1.8 mg/dL (ref 1.7–2.4)

## 2018-06-18 MED ORDER — SODIUM CHLORIDE 0.9 % IV SOLN
Freq: Once | INTRAVENOUS | Status: AC
  Administered 2018-06-18: 11:00:00 via INTRAVENOUS
  Filled 2018-06-18: qty 250

## 2018-06-18 MED ORDER — GABAPENTIN 300 MG PO CAPS: 300.0000 mg | ORAL_CAPSULE | Freq: Every day | ORAL | 0 refills | Status: DC

## 2018-06-18 MED ORDER — SENNOSIDES-DOCUSATE SODIUM 8.6-50 MG PO TABS: 1.0000 | ORAL_TABLET | Freq: Every day | ORAL | 0 refills | Status: AC

## 2018-06-18 MED ORDER — POLYETHYLENE GLYCOL 3350 17 G PO PACK: 17.0000 g | PACK | Freq: Every day | ORAL | 0 refills | Status: AC

## 2018-06-18 MED ORDER — DEXAMETHASONE SODIUM PHOSPHATE 10 MG/ML IJ SOLN
10.0000 mg | Freq: Once | INTRAMUSCULAR | Status: AC
  Administered 2018-06-18: 10 mg via INTRAVENOUS
  Filled 2018-06-18: qty 1

## 2018-06-18 MED ORDER — ONDANSETRON HCL 4 MG/2ML IJ SOLN
8.0000 mg | Freq: Once | INTRAMUSCULAR | Status: AC
  Administered 2018-06-18: 8 mg via INTRAVENOUS
  Filled 2018-06-18: qty 4

## 2018-06-18 NOTE — Telephone Encounter (Signed)
Daughter called and reports that patient is in pain in her legs in her bones and she cannot walk without difficulty due to the pain. It has been getting worse over the past 3 days Appointment accepted for 10 AM this morning

## 2018-06-18 NOTE — Patient Instructions (Signed)
It was nice seeing you today.  A [facility] for gabapentin 300 mg to be taken at night.  Remember this medication may cause you to be sleepy.  Take at bedtime and see if you notice improvement of your leg pain.  If improvement noted, but not resolved we can add in an additional gabapentin in the morning.   For your constipation I recommend MiraLAX daily and Senokot daily.  This can be bought at any local pharmacy over-the-counter.  You do not need a prescription for these.  If you do not have a bowel movement or develop abdominal pain please call the clinic.  You should have a bowel movement by end of day tomorrow.  If you develop diarrhea stop taking the medications.  If your back pain persists, we may need to get imaging.  Let me know if your abdominal pain gets worse and or if you develop symptoms that fluid is accumulating again.   Estreimiento en los adultos Constipation, Adult Se llama estreimiento cuando:  Tiene deposiciones (defeca) una menor cantidad de veces a la semana de lo normal.  Tiene dificultad para defecar.  Las heces son secas y duras o son ms grandes que lo normal. Siga estas indicaciones en su casa: Comida y bebida   Consuma alimentos con alto contenido de [place], por ejemplo: ? Lambert Mody y verduras frescas. ? Cereales integrales. ? Frijoles.  Consuma una menor cantidad de alimentos ricos en grasas, con bajo contenido de [place] o excesivamente procesados, como: ? Papas fritas. ? Hamburguesas. ? Galletas. ? Caramelos. ? Gaseosas.  Beba suficiente lquido para mantener el pis (orina) claro o de color amarillo plido. Instrucciones generales  Haga actividad fsica con regularidad o segn las indicaciones del mdico.  Vaya al bao cuando sienta la necesidad de defecar. No se aguante las ganas.  Tome los medicamentos de venta libre y los recetados solamente como se lo haya indicado el mdico. Estos incluyen los suplementos  de [place].  Realice ejercicios de reentrenamiento del suelo plvico, como: ? Respirar profundamente mientras relaja la parte inferior del vientre (abdomen). ? Relajar el suelo plvico mientras defeca.  Controle su afeccin para ver si hay cambios.  Concurra a todas las visitas de control como se lo haya indicado el mdico. Esto es importante. Comunquese con un mdico si:  Siente un dolor que empeora.  Tiene fiebre.  No ha defecado por 4das.  Vomita.  No tiene hambre.  Pierde peso.  Tiene una hemorragia en el ano.  Las deposiciones [employer]) son delgadas como un lpiz. Solicite ayuda de inmediato si:  Jaclynn Guarneri, y los sntomas empeoran de repente.  Tiene prdida de materia fecal u observa [employer].  Siente el vientre ms duro o ms grande de lo normal (est hinchado).  Siente un dolor muy intenso en el vientre.  Se siente mareado o se desmaya. Esta informacin no tiene Marine scientist el consejo del mdico. Asegrese de hacerle al mdico cualquier pregunta que tenga. Document Released: 07/15/2010 Document Revised: 09/13/2016 Document Reviewed: 12/01/2015 Elsevier Interactive Patient Education  2019 [employer].

## 2018-06-18 NOTE — Progress Notes (Signed)
Symptom Management Consult note [facility]  [phone] [phone]  Patient Care Team: Letta Median, MD as PCP - General (Family Medicine) Clent Jacks, RN as Registered Nurse Rico Junker, RN as Registered Nurse Theodore Demark, RN as Registered Nurse   Name of the patient: Elizabeth Davenport  [id number]  1956/01/24   Date of visit: 06/18/2018  Diagnosis: Low-grade serous endometrial cancer  Chief Complaint: Leg pain  Current Treatment: S/p cycle 4 carbo/taxol  Oncology History: Patient was last seen by primary medical oncologist Dr. Grayland Ormond on 06/07/2018 for discussion of imaging results and treatment planning.  Imaging from 06/06/2018 revealed clear progression of disease with increasing Ca1 25.  Pathology from paracentesis was positive for malignant cells.  He was recommended she reinitiate chemotherapy with carbo/Taxol.  Additionally they discussed hospice and end-of-life care.  Patient reluctant to begin chemotherapy but wishes to pursue.   Reinitiated carbo/Taxol on 06/14/2018.  Patient was seen in [facility] 06/03/2018 for abdominal swelling and discomfort.  Patient had 1.9 L of fluid removed from abdomen on 06/04/2018.  Fluid positive for malignant cells.  Had CT abdomen/pelvis and chest on 06/06/2018 revealing progression of omental, peritoneal and mesenteric metastatic disease.  Continued to have moderate amount of ascites.  Mildly enlarged lymph nodes in the lower mediastinum.  Seen in GYN-oncology on 04/10/2018 by Dr. Theora Gianotti.  Per her note, there were concerns of progressive disease based on her symptoms and recent CT scan.  Agreed that d/t intolerance of letrozole that anastrozole could be tried.  If progressive disease is noted, trametinib was another available option.  Scheduled to return to clinic in 3 months for further evaluation.  Evaluated in ER on 04/05/2018 for lower back pain, abdominal bloating and frequent  urination.  She was diagnosed with UTI and treated with cephalexin.  CT renal stone study revealed new increased density and pelvis that likely represented free fluid related to metastatic disease.  Oncology History   She has no complaints today. Normal GI/GU function. Accompanied by daughter and interpreter.  Oncology history: Elizabeth Davenport is a 62 y.o. G2P2 Hispanic woman who developed light vaginal bleeding in early 2016 after undergoing LMP/menopause in 2014. Dr DeFrancesco did PAP that was normal. 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 CT scan 09/22/14 abd/pelvis negative for metastatic disease. She was referred to [place] and on exam the cervix was normal. Slides were reviewed at [facility] and read as probable endometrial primary. She underwent TLH/BSO Nov 04, 2014 at [facility]. Right ovarian excresence noted at surgery. Survey of the rest of the abdomen, including the omentum and right diaphragm was negative. Further staging not performed due to patient's refusal to accept blood products as she is a Jehovah's witness. Path showed 2.6 cm grade 1 endometrial cancer without LVSI or cervical involvement. The ovaries both were found to have low grade serous cancers of 7 and 8 mm. No spread noted, but washings were positive.  UTERUS, CERVIX, BILATERAL FALLOPIAN TUBES AND OVARIES; TOTAL  LAPAROSCOPIC HYSTERECTOMY AND BILATERAL SALPINGO-OOPHORECTOMY:  - WELL-DIFFERENTIATED ENDOMETRIOID CARCINOMA (FIGO I).  - BILATERAL OVARIES WITH LOW-GRADE SEROUS CARCINOMA.  - FALLOPIAN TUBES NEGATIVE FOR MALIGNANCY.  UTERINE TUMOR  Histologic Type:  Endometrioid adenocarcinoma, not otherwise  characterized  Histologic Grade:  FIGO grade 1  Tumor Size: 2.6cm  Myometrial Invasion:   Present  Depth of Myometrial Invasion: 23mm  Myometrial Thickness (mm):  48mm  Involvement of Cervix:  Not involved  Lymph-Vascular Invasion: Not identified    OVARIAN TUMOR Histologic Type:  Serous, carcinoma  Histologic Grade  World Health Organization [facility]) Grading System:  G2: Moderately differentiated  Two-Tier Grading System: Low grade  Tumor Size:  Right Ovary Tumor Size. Greatest dimension (cm) 0.8cm  Left Ovary Tumor Size. Greatest dimension (cm) 0.7cm  Ovarian Surface Involvement: Present  Lymph-Vascular Invasion: Not identified   Oncology History:  Problem List: Patient Active Problem List   Diagnosis Date Noted  . Endometrial cancer 11/04/2014  . S/P laparoscopic hysterectomy 11/04/2014            Ovarian carcinoma, unspecified laterality ([place])   11/25/2014 Initial Diagnosis    Ovarian cancer, bilateral     Subjective Data:  ECOG: 1 - Symptomatic but completely ambulatory  Subjective:     Elizabeth Davenport is a 62 y.o. female who presents for evaluation of symptoms of burning, tingling and cramping. Onset of symptoms was  Symptoms are currently  mild severity. Symptoms occur intermittently and last minutes. The patient denies squeezing and hypersensitivity. Symptoms are symmetric. She also describes autonomic symptoms of  orthostasis and constipation. She denies  diarrhea. Previous treatment has included none to date.   She also complains of nausea without vomiting since chemotherapy on 06/14/2018.  Notes a change in taste.  Able to tolerate fluids and solids.  Complains of constipation.  Has not tried anything to date.   The following portions of the patient's history were reviewed and updated as appropriate: allergies, current medications, past family history, past medical history, past social history, past surgical history and problem list. Review of Systems A comprehensive review of systems was negative except for: Constitutional: positive for fatigue and malaise Cardiovascular: positive for fatigue and tachycardia Gastrointestinal: positive for abdominal pain, constipation and nausea Musculoskeletal:  positive for back pain, bone pain and muscle weakness Neurological: positive for weakness       Objective:    BP 126/86 (BP Location: Right Arm, Patient Position: Sitting) Comment: 111/79 standing  Pulse (!) 112 Comment: 120 standing  Temp (!) 97.2 F (36.2 C) (Tympanic)   Resp 18   Wt 174 lb 12.8 oz (79.3 kg)   LMP 11/04/2011   BMI 33.03 kg/m  General appearance: alert, fatigued, no distress, moderately obese and pale Lungs: clear to auscultation bilaterally Heart: tachy Abdomen: abnormal findings:  hypoactive bowel sounds and obese Neurologic: Grossly normal    Assessment:     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 Constipation  Plan:    Antiemetics per medication orders. IV fluids per orders. Labs per orders. OTC Miralax and Senna for constipation recommended.    Stage Ia grade 1 endometrial cancer: Diagnosed with low-grade serous cancer s/p laparoscopic omentectomy 12/13/2017 with optimal debulking surgery. S/p 3 cycles of adjuvant carbo/Taxol on 02/26/2018.  Started on letrozole but on 04/01/2018 called the clinic and refused to take d/t progressive bony and low back pain.  Started on anastrozole and tolerated well to date.  CT scan from 06/06/2018 revealed obvious progression of disease requiring 1.9 L fluid removed from abdomen.  Reinitiated chemotherapy with carbo/Taxol on 06/14/2018.  Scheduled to return to clinic on 07/03/2018 for assessment and labs prior to cycle 5 carbo/Taxol.   Bilateral lower extremity neuropathy: Likely due to chemotherapy.  Will start on low-dose gabapentin at night and see if this improves her symptoms.   Myalgias/arthralgias: Likely due to chemotherapy.  Continue Tylenol as needed.  Will start low-dose gabapentin and reevaluate on Thursday or Friday of this  week.  If lumbar back pain persist, may need imaging.  She denies any injury or falls.  Tachycardia: We will get stat EKG to rule out arrhythmias.  Likely due to  dehydration. Improved with fluids. 90 on recheck.  Nausea: Managed with OTC medications at home.  Continue as needed.  Constipation: Hypoactive bowel sounds.  Constipation likely exacerbated by dehydration and recent chemotherapy.  Needs bowel regimen.  Recommend MiraLAX daily with the addition of Senokot.  Would recommend MiraLAX +/- senna for treatment/management of constipation.  Second line would be Linzess +/- enema. IV fluids were given in clinic to help hydrate and hopefully reduce effects of constipation and lessen spasm pain.  We discussed the foods that are high in insoluble fiber such as fruits and vegetables can be helpful when experiencing constipation and also discussed the importance of staying well-hydrated.  Samples provided for Pedialyte and Ensure clears to help with hydration.  Plan: Stat EKG. NSR. HR improved 90.  Stat labs.  Mild hyponatremia. 250 ml of fluids.  Rx gabapentin 300 mg at bedtime. We will give 250 mL's of NaCl. Give 8 mg Zofran. Give 10 mg Decadron. Start MiraLAX and Senokot today.  Patient to call clinic if no bowel movement in the next 24 to 48 hours.  Can also try mag citrate x1 dose.  Education provided to patient and family.  We will have her return to clinic later this week for repeat check of labs, toleration of gabapentin, resolution of contipation and improving myalgias.  Interpreter present.  Greater than 50% was spent in counseling and coordination of care with this patient including but not limited to discussion of the relevant topics above (See A&P) including, but not limited to diagnosis and management of acute and chronic medical conditions.   Faythe Casa, NP 06/18/2018 11:29 AM

## 2018-06-21 ENCOUNTER — Inpatient Hospital Stay: Payer: Self-pay

## 2018-06-21 ENCOUNTER — Inpatient Hospital Stay (HOSPITAL_BASED_OUTPATIENT_CLINIC_OR_DEPARTMENT_OTHER): Payer: Self-pay | Admitting: Nurse Practitioner

## 2018-06-21 ENCOUNTER — Other Ambulatory Visit: Payer: Self-pay

## 2018-06-21 VITALS — BP 134/91 | HR 91 | Temp 97.5°F | Resp 16

## 2018-06-21 DIAGNOSIS — F419 Anxiety disorder, unspecified: Secondary | ICD-10-CM

## 2018-06-21 DIAGNOSIS — K7689 Other specified diseases of liver: Secondary | ICD-10-CM

## 2018-06-21 DIAGNOSIS — I10 Essential (primary) hypertension: Secondary | ICD-10-CM

## 2018-06-21 DIAGNOSIS — Z5111 Encounter for antineoplastic chemotherapy: Secondary | ICD-10-CM

## 2018-06-21 DIAGNOSIS — R531 Weakness: Secondary | ICD-10-CM

## 2018-06-21 DIAGNOSIS — K219 Gastro-esophageal reflux disease without esophagitis: Secondary | ICD-10-CM

## 2018-06-21 DIAGNOSIS — C786 Secondary malignant neoplasm of retroperitoneum and peritoneum: Secondary | ICD-10-CM

## 2018-06-21 DIAGNOSIS — R599 Enlarged lymph nodes, unspecified: Secondary | ICD-10-CM

## 2018-06-21 DIAGNOSIS — D509 Iron deficiency anemia, unspecified: Secondary | ICD-10-CM

## 2018-06-21 DIAGNOSIS — Z79899 Other long term (current) drug therapy: Secondary | ICD-10-CM

## 2018-06-21 DIAGNOSIS — E871 Hypo-osmolality and hyponatremia: Secondary | ICD-10-CM

## 2018-06-21 DIAGNOSIS — K30 Functional dyspepsia: Secondary | ICD-10-CM

## 2018-06-21 DIAGNOSIS — M545 Low back pain: Secondary | ICD-10-CM

## 2018-06-21 DIAGNOSIS — Z79811 Long term (current) use of aromatase inhibitors: Secondary | ICD-10-CM

## 2018-06-21 DIAGNOSIS — R971 Elevated cancer antigen 125 [CA 125]: Secondary | ICD-10-CM

## 2018-06-21 DIAGNOSIS — G893 Neoplasm related pain (acute) (chronic): Secondary | ICD-10-CM

## 2018-06-21 DIAGNOSIS — D3910 Neoplasm of uncertain behavior of unspecified ovary: Secondary | ICD-10-CM

## 2018-06-21 DIAGNOSIS — C569 Malignant neoplasm of unspecified ovary: Secondary | ICD-10-CM

## 2018-06-21 DIAGNOSIS — M542 Cervicalgia: Secondary | ICD-10-CM

## 2018-06-21 DIAGNOSIS — G629 Polyneuropathy, unspecified: Secondary | ICD-10-CM

## 2018-06-21 DIAGNOSIS — Z87891 Personal history of nicotine dependence: Secondary | ICD-10-CM

## 2018-06-21 DIAGNOSIS — R109 Unspecified abdominal pain: Secondary | ICD-10-CM

## 2018-06-21 DIAGNOSIS — R188 Other ascites: Secondary | ICD-10-CM

## 2018-06-21 DIAGNOSIS — Z90710 Acquired absence of both cervix and uterus: Secondary | ICD-10-CM

## 2018-06-21 DIAGNOSIS — R634 Abnormal weight loss: Secondary | ICD-10-CM

## 2018-06-21 DIAGNOSIS — Z90722 Acquired absence of ovaries, bilateral: Secondary | ICD-10-CM

## 2018-06-21 DIAGNOSIS — K5900 Constipation, unspecified: Secondary | ICD-10-CM

## 2018-06-21 DIAGNOSIS — R000 Tachycardia, unspecified: Secondary | ICD-10-CM

## 2018-06-21 DIAGNOSIS — M79606 Pain in leg, unspecified: Secondary | ICD-10-CM

## 2018-06-21 DIAGNOSIS — C561 Malignant neoplasm of right ovary: Secondary | ICD-10-CM

## 2018-06-21 DIAGNOSIS — K5730 Diverticulosis of large intestine without perforation or abscess without bleeding: Secondary | ICD-10-CM

## 2018-06-21 DIAGNOSIS — Z803 Family history of malignant neoplasm of breast: Secondary | ICD-10-CM

## 2018-06-21 LAB — IRON AND TIBC
Iron: 22 ug/dL — ABNORMAL LOW (ref 28–170)
SATURATION RATIOS: 10 % — AB (ref 10.4–31.8)
TIBC: 231 ug/dL — AB (ref 250–450)
UIBC: 209 ug/dL

## 2018-06-21 LAB — CBC WITH DIFFERENTIAL/PLATELET
ABS IMMATURE GRANULOCYTES: 0.02 10*3/uL (ref 0.00–0.07)
Basophils Absolute: 0 10*3/uL (ref 0.0–0.1)
Basophils Relative: 0 %
Eosinophils Absolute: 0.1 10*3/uL (ref 0.0–0.5)
Eosinophils Relative: 2 %
HCT: 31 % — ABNORMAL LOW (ref 36.0–46.0)
Hemoglobin: 9.1 g/dL — ABNORMAL LOW (ref 12.0–15.0)
Immature Granulocytes: 1 %
Lymphocytes Relative: 33 %
Lymphs Abs: 1.1 10*3/uL (ref 0.7–4.0)
MCH: 22.9 pg — ABNORMAL LOW (ref 26.0–34.0)
MCHC: 29.4 g/dL — ABNORMAL LOW (ref 30.0–36.0)
MCV: 77.9 fL — ABNORMAL LOW (ref 80.0–100.0)
Monocytes Absolute: 0.2 10*3/uL (ref 0.1–1.0)
Monocytes Relative: 5 %
NEUTROS ABS: 2 10*3/uL (ref 1.7–7.7)
Neutrophils Relative %: 59 %
Platelets: 410 10*3/uL — ABNORMAL HIGH (ref 150–400)
RBC: 3.98 MIL/uL (ref 3.87–5.11)
RDW: 18 % — ABNORMAL HIGH (ref 11.5–15.5)
WBC: 3.4 10*3/uL — ABNORMAL LOW (ref 4.0–10.5)
nRBC: 0 % (ref 0.0–0.2)

## 2018-06-21 LAB — COMPREHENSIVE METABOLIC PANEL
ALBUMIN: 3.2 g/dL — AB (ref 3.5–5.0)
ALT: 15 U/L (ref 0–44)
AST: 19 U/L (ref 15–41)
Alkaline Phosphatase: 50 U/L (ref 38–126)
Anion gap: 8 (ref 5–15)
BUN: 8 mg/dL (ref 8–23)
CO2: 28 mmol/L (ref 22–32)
Calcium: 9.2 mg/dL (ref 8.9–10.3)
Chloride: 99 mmol/L (ref 98–111)
Creatinine, Ser: 0.51 mg/dL (ref 0.44–1.00)
GFR calc Af Amer: >60 mL/min (ref >60)
GFR calc non Af Amer: >60 mL/min (ref >60)
Glucose, Bld: 105 mg/dL — ABNORMAL HIGH (ref 70–99)
Potassium: 3.8 mmol/L (ref 3.5–5.1)
Sodium: 135 mmol/L (ref 135–145)
Total Bilirubin: 0.7 mg/dL (ref 0.3–1.2)
Total Protein: 8.1 g/dL (ref 6.5–8.1)

## 2018-06-21 LAB — FERRITIN: Ferritin: 456 ng/mL — ABNORMAL HIGH (ref 11–307)

## 2018-06-21 MED ORDER — SODIUM CHLORIDE 0.9 % IV SOLN
Freq: Once | INTRAVENOUS | Status: AC
  Administered 2018-06-21: 10:00:00 via INTRAVENOUS
  Filled 2018-06-21: qty 250

## 2018-06-21 MED ORDER — GABAPENTIN 300 MG PO CAPS: 300.0000 mg | ORAL_CAPSULE | Freq: Two times a day (BID) | ORAL | 0 refills | Status: DC

## 2018-06-21 MED ORDER — TRAMADOL HCL 50 MG PO TABS: 50.0000 mg | ORAL_TABLET | Freq: Four times a day (QID) | ORAL | 0 refills | Status: DC | PRN

## 2018-06-21 MED ORDER — FAMOTIDINE IN NACL 20-0.9 MG/50ML-% IV SOLN
20.0000 mg | Freq: Once | INTRAVENOUS | Status: AC
  Administered 2018-06-21: 20 mg via INTRAVENOUS
  Filled 2018-06-21: qty 50

## 2018-06-21 MED ORDER — PANTOPRAZOLE SODIUM 40 MG PO TBEC: 40.0000 mg | DELAYED_RELEASE_TABLET | Freq: Every day | ORAL | 3 refills | Status: AC

## 2018-06-21 NOTE — Patient Instructions (Addendum)
As we discussed, you may notice some improvement in your symptoms with acupuncture. Below is the names of some local clinics that offer our cancer survivors a discounted rate of $40/session. You can contact them for an appointment at your convenience.  - [place] [place] [facility]) or [phone] [facility])  Please increase gabapentin to twice a day. I have sent a prescription for Protonix for your acid reflux to [facility]. I have also sent a prescription for Tramadol for your pain. Someone will be in touch with you regarding the wheelchair. Please notify me if you've had no improvement in your symptoms, or symptoms become worse, in the next 3-4 days. It was a pleasure seeing you today and thank you for allowing me to participate in your care. -Beckey Rutter, NP

## 2018-06-21 NOTE — Progress Notes (Signed)
Patient here to be re-evaluated in symptom management clinic today for leg pain associated with neuropathy.

## 2018-06-21 NOTE — Progress Notes (Signed)
Symptom Management Clinic Progress Note [facility]  Telephone:([phone] [phone]  Patient Care Team: Letta Median, MD as PCP - General (Family Medicine) Clent Jacks, RN as Registered Nurse Rico Junker, RN as Registered Nurse Theodore Demark, RN as Registered Nurse   Name of the patient: Elizabeth Davenport  [id number]  17-Feb-1956   Date of visit: 06/21/18  Diagnosis- recurrent low-grade serous ovarian carcinoma  Chief complaint/ Reason for visit- acid reflux & leg weakness  Heme/Onc history:  Oncology History   Elizabeth Davenport, presented as a 62 year old G2, P2 Hispanic female who developed light vaginal bleeding in early 2016.  Reports menopause in 2014.  Dr. Enzo Bi did a Pap that was normal.  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  Focally ER PR negative.  CT scan on 09/22/2014 abdomen/pelvis negative for metastatic disease.  She was referred to gynecologic and exam of cervix was normal.  Slides were reviewed at [facility] and read as probable endometrial primary.  She underwent TLH-BSO on 11/04/2014 at [facility].  Right ovarian excresence noted at surgery.  Survey of the rest of the abdomen, including omentum, and right diaphragm was negative.  Further staging was not performed due to patient's refusal to accept blood products that she is a Jehovah's Witness.  Pathology at [facility] showed a 2.6 cm grade 1 endometrial cancer without LVSI or cervical involvement.  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  No spread noted but washings were positive.  In view of the path report, suggestive of two primaries, pathology was reviewed at [facility].  The review confirmed that the uterine tumor is in early grade 1 endometrial adenocarcinoma.  However the ovarian excresences (were read as low-grade serous carcinoma) were interpreted as serous borderline tumors by [facility] pathologists (multiple).  The  recommendation was for surveillance.  CA 125 05/05/2015 8.1 11/03/2015 12.7 05/10/2016 16.2 11/08/2016 15.4 05/23/2017 30.6 07/09/2017 25.2  On 10/31/2017 she presented to [facility] complaining of left lower quadrant abdominal pain which showed interval development of omental and peritoneal implants/metastatic disease and small amount of ascites with multiple small nodularities in the left upper quadrant including the left hemidiaphragm consistent with peritoneal implants.  Mildly rounded nodular density/lymph nodes at the cardiophrenic angles new and concerning for metastatic disease.  CT-guided biopsy performed. Pathology: A.  Omentum, left anterior abdomen-positive for involvement by epithelial neoplasm of gynecologic origin  11/14/2017 204.4  12/13/2017-underwent diagnostic laparoscopy, omentectomy, optimally debulked, for removal of mass.  Pathology-low-grade serous carcinoma in background of implants of serous borderline tumor.  Positive cytology ' suspicious for malignancy'.  Recommended to start chemotherapy followed by hormonal therapy.    01/22/2018 322.0 02/05/2018 314.0 02/26/2018 75.7  She completed 3 cycles of adjuvant carboplatin-Taxol on 02/26/2018.  Was started on letrozole, but on 04/01/2018 call the clinic and refused to take more due to bone and low back pain.  Plan was to washout and start anastrozole.  Trametinib was discussed if progressive disease.   03/19/2018 22.1  Presented to ER on 04/05/2018 complaining of low back pain and urinary frequency.  She was treated for possible UTI.  CT scan showed diffuse nodular infiltration throughout the mesentery and omentum consistent with peritoneal carcinomatosis. Started anastrozole.   04/10/2018 93.8 06/03/2018 134.0  Seen in symptom management clinic for abdominal swelling and discomfort.  Underwent paracentesis which yielded 1.9 L of fluid on 06/04/2018.Marland Kitchen  Fluid positive for malignancy.  CT chest/abdomen/pelvis on  06/06/2018 showed  progression.  CT chest/abdomen/pelvis scan 06/06/2018 showed progression.  Patient required paracentesis.  Fluid positive for malignant cells.     Ovarian carcinoma, unspecified laterality ([place])   11/25/2014 Initial Diagnosis    Ovarian cancer, bilateral     Interval history- Elizabeth Davenport, 62 year old female with above history of recurrent low grade serous ovarian cancer who presents to Symptom Management clinic for acid reflux and weakness.  She states symptoms appeared in the past few days and have gradually worsened since that time.  She describes burning of her esophagus and overall feeling increasingly tired.  She has tried resting without improvement in her symptoms.  Has noticed some improvement in her leg pain since starting gabapentin.  Complains of intermittent abdominal pain.  She has not tried taking anything for this.  She continues to be active around her house.  Appetite has been poor and she is observed weight loss. She denies fever or illness.  Denies nausea, vomiting, constipation, or diarrhea.  ECOG FS:2 - Symptomatic, <50% confined to bed  Review of systems- Review of Systems  Constitutional: Positive for malaise/fatigue. Negative for chills and fever.  HENT: Negative.   Eyes: Negative.   Respiratory: Negative.   Cardiovascular: Negative.   Gastrointestinal: Positive for heartburn and nausea (bilious reflux). Negative for abdominal pain, blood in stool, constipation, diarrhea, melena and vomiting.  Genitourinary: Negative.   Musculoskeletal: Positive for myalgias. Negative for back pain, falls, joint pain and neck pain.  Skin: Negative.   Neurological: Positive for tingling (legs) and weakness. Negative for dizziness and headaches.  Endo/Heme/Allergies: Negative.   Psychiatric/Behavioral: Positive for depression. The patient is nervous/anxious.      Current treatment-carbo-Taxol  No Known Allergies  Past Medical History:  Diagnosis Date  . Arthritis    left  knee  . Asthma   . Endometrial cancer ([place]) 11/04/14  . PONV (postoperative nausea and vomiting)     Past Surgical History:  Procedure Laterality Date  . ABDOMINAL HYSTERECTOMY    . LAPAROSCOPIC BILATERAL SALPINGO OOPHERECTOMY Bilateral 11/04/2014   Procedure: LAPAROSCOPIC BILATERAL SALPINGO OOPHORECTOMY;  Surgeon: Mellody Drown, MD;  Location: [facility] ORS;  Service: Gynecology;  Laterality: Bilateral;  . LAPAROSCOPIC HYSTERECTOMY N/A 11/04/2014   Procedure: HYSTERECTOMY TOTAL LAPAROSCOPIC;  Surgeon: Mellody Drown, MD;  Location: [facility] ORS;  Service: Gynecology;  Laterality: N/A;  . removed mass from thyriod      Social History   Socioeconomic History  . Marital status: Married    Spouse name: Not on file  . Number of children: Not on file  . Years of education: Not on file  . Highest education level: Not on file  Occupational History  . Not on file  Social Needs  . Financial resource strain: Not on file  . Food insecurity:    Worry: Not on file    Inability: Not on file  . Transportation needs:    Medical: Not on file    Non-medical: Not on file  Tobacco Use  . Smoking status: Former Smoker    Packs/day: 0.25    Years: 8.00    Pack years: 2.00    Types: Cigarettes    Last attempt to quit: 06/29/1990    Years since quitting: 27.9  . Smokeless tobacco: Never Used  . Tobacco comment: Smoking History Cigarettes 1 cig per day; quit 22 years ago; smoked for 8 years  Substance and Sexual Activity  . Alcohol use: No    Alcohol/week: 0.0 standard drinks  Comment: occasional   . Drug use: No  . Sexual activity: Yes    Partners: Male  Lifestyle  . Physical activity:    Days per week: Not on file    Minutes per session: Not on file  . Stress: Not on file  Relationships  . Social connections:    Talks on phone: Not on file    Gets together: Not on file    Attends religious service: Not on file    Active member of club or organization: Not on file    Attends meetings of  clubs or organizations: Not on file    Relationship status: Not on file  . Intimate partner violence:    Fear of current or ex partner: Not on file    Emotionally abused: Not on file    Physically abused: Not on file    Forced sexual activity: Not on file  Other Topics Concern  . Not on file  Social History Narrative  . Not on file    Family History  Problem Relation Age of Onset  . Breast cancer Sister 56  . Asthma Mother   . Coronary artery disease Mother   . Diabetes Brother   . Diabetes Sister   . Diabetes Sister   . Diabetes Brother      Current Outpatient Medications:  .  acetaminophen (TYLENOL) 500 MG tablet, Take 500 mg by mouth every 6 (six) hours as needed., Disp: , Rfl:  .  albuterol (PROVENTIL HFA;VENTOLIN HFA) 108 (90 BASE) MCG/ACT inhaler, Inhale 2 puffs into the lungs every 6 (six) hours as needed for wheezing or shortness of breath., Disp: , Rfl:  .  gabapentin (NEURONTIN) 300 MG capsule, Take 1 capsule (300 mg total) by mouth at bedtime., Disp: 30 capsule, Rfl: 0 .  ondansetron (ZOFRAN) 8 MG tablet, Take 1 tablet (8 mg total) by mouth 2 (two) times daily as needed for refractory nausea / vomiting., Disp: 30 tablet, Rfl: 2 .  polyethylene glycol (MIRALAX) packet, Take 17 g by mouth daily., Disp: 14 each, Rfl: 0 .  prochlorperazine (COMPAZINE) 10 MG tablet, Take 1 tablet (10 mg total) by mouth every 6 (six) hours as needed (Nausea or vomiting)., Disp: 60 tablet, Rfl: 2 .  senna-docusate (SENNA S) 8.6-50 MG tablet, Take 1 tablet by mouth daily., Disp: 30 tablet, Rfl: 0 .  exemestane (AROMASIN) 25 MG tablet, Take 1 tablet (25 mg total) by mouth daily after breakfast. (Patient not taking: Reported on 06/21/2018), Disp: 30 tablet, Rfl: 3 .  fluticasone (FLOVENT HFA) 220 MCG/ACT inhaler, Take by mouth., Disp: , Rfl:   Physical exam:  Vitals:   06/21/18 0917  BP: (!) 134/91  Pulse: 91  Resp: 16  Temp: (!) 97.5 F (36.4 C)  TempSrc: Tympanic   Physical  Exam Constitutional:      Comments: Fatigued appearing  HENT:     Head:     Comments: alopecia    Mouth/Throat:     Mouth: Mucous membranes are moist.  Eyes:     General: No scleral icterus.    Conjunctiva/sclera: Conjunctivae normal.  Neck:     Musculoskeletal: Normal range of motion.  Cardiovascular:     Rate and Rhythm: Normal rate and regular rhythm.     Pulses: Normal pulses.     Heart sounds: Normal heart sounds.  Pulmonary:     Effort: Pulmonary effort is normal. No respiratory distress.     Breath sounds: Normal breath sounds.  Abdominal:  General: There is no distension.     Tenderness: There is no abdominal tenderness. There is no guarding or rebound.  Lymphadenopathy:     Cervical: No cervical adenopathy.  Skin:    General: Skin is warm and dry.     Coloration: Skin is pale.  Neurological:     Mental Status: She is alert and oriented to person, place, and time.  Psychiatric:        Mood and Affect: Mood normal.        Behavior: Behavior normal.      CMP Latest Ref Rng & Units 06/21/2018  Glucose 70 - 99 mg/dL 105(H)  BUN 8 - 23 mg/dL 8  Creatinine 0.44 - 1.00 mg/dL 0.51  Sodium 135 - 145 mmol/L 135  Potassium 3.5 - 5.1 mmol/L 3.8  Chloride 98 - 111 mmol/L 99  CO2 22 - 32 mmol/L 28  Calcium 8.9 - 10.3 mg/dL 9.2  Total Protein 6.5 - 8.1 g/dL 8.1  Total Bilirubin 0.3 - 1.2 mg/dL 0.7  Alkaline Phos 38 - 126 U/L 50  AST 15 - 41 U/L 19  ALT 0 - 44 U/L 15   CBC Latest Ref Rng & Units 06/21/2018  WBC 4.0 - 10.5 K/uL 3.4(L)  Hemoglobin 12.0 - 15.0 g/dL 9.1(L)  Hematocrit 36.0 - 46.0 % 31.0(L)  Platelets 150 - 400 K/uL 410(H)    No images are attached to the encounter.  Ct Chest W Contrast  Result Date: 06/06/2018 CLINICAL DATA:  62 year old with personal history of grade 1 endometrial cancer diagnosed in 2016 for which the patient underwent TAH-BSO; 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 She had omental recurrence  in May, 2019 for which she is being treated with anastrozole. She presents now with rising CA-125 levels. EXAM: CT CHEST, ABDOMEN, AND PELVIS WITH CONTRAST TECHNIQUE: Multidetector CT imaging of the chest, abdomen and pelvis was performed following the standard protocol during bolus administration of intravenous contrast. CONTRAST:  152mL ISOVUE-300 IOPAMIDOL INJECTION 61% IV. Oral contrast was also administered. COMPARISON:  CT abdomen and pelvis 04/05/2018, 10/31/2017, 09/22/2014. No prior chest CT. FINDINGS: CT CHEST FINDINGS Cardiovascular: Normal heart size. No pericardial effusion. No visible coronary atherosclerosis. No visible atherosclerosis involving the thoracic aorta or the proximal great vessels. Mediastinum/Nodes: LOWER mediastinal lymph nodes in the paracardiac fat adjacent to the RIGHT side of the heart, at least 2 of which have increased slightly in size since the 04/05/2018 CT. No pathologically enlarged lymph nodes elsewhere in the mediastinum, hila or axilla. No mediastinal masses. Normal-appearing esophagus. Visualized thyroid gland normal in appearance. Lungs/Pleura: Minimal linear scarring in the lower lobes and in the inferior right upper lobe. Pulmonary parenchyma otherwise clear without localized airspace consolidation, interstitial disease, or parenchymal nodules or masses. Central airways patent without significant bronchial wall thickening. No pleural effusions. No pleural plaques or masses. Musculoskeletal: Regional skeleton unremarkable without acute or significant osseous abnormality. No evidence of osseous metastatic disease. Other: Approximate 1.4 cm subcutaneous mass involving the upper back visible on the initial image. CT ABDOMEN PELVIS FINDINGS Hepatobiliary: Multiple hepatic cysts as noted on prior examinations. No new or suspicious hepatic masses. Gallbladder normal in appearance without calcified gallstones. No biliary ductal dilation. Pancreas: Normal in appearance without  evidence of mass, ductal dilation, or inflammation. Spleen: Normal in size and appearance. Adrenals/Urinary Tract: Normal appearing adrenal glands. Approximate 4 cm simple cyst arising from the mid RIGHT kidney as noted previously. No significant parenchymal abnormality involving either kidney. No hydronephrosis. No  urinary tract calculi. Normal appearing decompressed urinary bladder. Stomach/Bowel: Stomach normal in appearance for the degree of distention. Normal-appearing small bowel. Descending colon, sigmoid colon and rectum completely decompressed. Sigmoid colon diverticulosis without evidence of acute diverticulitis. Remainder of the colon normal in appearance with moderate stool burden in the ascending and transverse colon. Normal appearing gas-filled appendix in the RIGHT UPPER pelvis extending to the midline. Vascular/Lymphatic: Mild aortic atherosclerosis without evidence of aneurysm. Retroaortic LEFT renal vein again noted. Normal-appearing portal venous and systemic venous systems. Mildly enlarged LEFT periaortic lymph node at the level of the renal hila measuring approximately 1.7 cm, increased in size since the 04/05/2018 CT. Numerous small lymph nodes elsewhere in the abdomen including the gastrohepatic ligament and the porta hepatis. Reproductive: Surgically absent uterus and ovaries. No visible adnexal masses. Other: Moderate amount of ascites, increased since the 04/05/2018 CT, including interloop ascites in the mesentery. Progression of omental, peritoneal and mesenteric metastatic disease since the 04/05/2018 CT. Musculoskeletal: Regional skeleton unremarkable without acute or significant osseous abnormality. No evidence of osseous metastatic disease. IMPRESSION: 1. Mildly enlarged lymph nodes in the LOWER mediastinum in the RIGHT paracardiac location, some of which which have slightly increased in size since the CT 04/05/2018. 2. No acute cardiopulmonary disease. 3. Moderate amount of ascites,  increased since the CT 04/05/2018. 4. Mildly enlarged LEFT periaortic lymph node at the level of the renal hila, measured above. Numerous small lymph nodes elsewhere in the abdomen including the gastrohepatic ligament and porta hepatis. These nodes have increased in size since the CT 04/05/2018 and indicate metastatic disease. 5. Progression of omental, peritoneal and mesenteric metastatic disease since the CT 04/05/2018. Electronically Signed   By: Evangeline Dakin M.D.   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 She had omental recurrence in May, 2019 for which she is being treated with anastrozole. She presents now with rising CA-125 levels. EXAM: CT CHEST, ABDOMEN, AND PELVIS WITH CONTRAST TECHNIQUE: Multidetector CT imaging of the chest, abdomen and pelvis was performed following the standard protocol during bolus administration of intravenous contrast. CONTRAST:  175mL ISOVUE-300 IOPAMIDOL INJECTION 61% IV. Oral contrast was also administered. COMPARISON:  CT abdomen and pelvis 04/05/2018, 10/31/2017, 09/22/2014. No prior chest CT. FINDINGS: CT CHEST FINDINGS Cardiovascular: Normal heart size. No pericardial effusion. No visible coronary atherosclerosis. No visible atherosclerosis involving the thoracic aorta or the proximal great vessels. Mediastinum/Nodes: LOWER mediastinal lymph nodes in the paracardiac fat adjacent to the RIGHT side of the heart, at least 2 of which have increased slightly in size since the 04/05/2018 CT. No pathologically enlarged lymph nodes elsewhere in the mediastinum, hila or axilla. No mediastinal masses. Normal-appearing esophagus. Visualized thyroid gland normal in appearance. Lungs/Pleura: Minimal linear scarring in the lower lobes  and in the inferior right upper lobe. Pulmonary parenchyma otherwise clear without localized airspace consolidation, interstitial disease, or parenchymal nodules or masses. Central airways patent without significant bronchial wall thickening. No pleural effusions. No pleural plaques or masses. Musculoskeletal: Regional skeleton unremarkable without acute or significant osseous abnormality. No evidence of osseous metastatic disease. Other: Approximate 1.4 cm subcutaneous mass involving the upper back visible on the initial image. CT ABDOMEN PELVIS FINDINGS Hepatobiliary: Multiple hepatic cysts as noted on prior examinations. No new or suspicious hepatic masses. Gallbladder normal in appearance without  calcified gallstones. No biliary ductal dilation. Pancreas: Normal in appearance without evidence of mass, ductal dilation, or inflammation. Spleen: Normal in size and appearance. Adrenals/Urinary Tract: Normal appearing adrenal glands. Approximate 4 cm simple cyst arising from the mid RIGHT kidney as noted previously. No significant parenchymal abnormality involving either kidney. No hydronephrosis. No urinary tract calculi. Normal appearing decompressed urinary bladder. Stomach/Bowel: Stomach normal in appearance for the degree of distention. Normal-appearing small bowel. Descending colon, sigmoid colon and rectum completely decompressed. Sigmoid colon diverticulosis without evidence of acute diverticulitis. Remainder of the colon normal in appearance with moderate stool burden in the ascending and transverse colon. Normal appearing gas-filled appendix in the RIGHT UPPER pelvis extending to the midline. Vascular/Lymphatic: Mild aortic atherosclerosis without evidence of aneurysm. Retroaortic LEFT renal vein again noted. Normal-appearing portal venous and systemic venous systems. Mildly enlarged LEFT periaortic lymph node at the level of the renal hila measuring approximately 1.7 cm, increased in size since the  04/05/2018 CT. Numerous small lymph nodes elsewhere in the abdomen including the gastrohepatic ligament and the porta hepatis. Reproductive: Surgically absent uterus and ovaries. No visible adnexal masses. Other: Moderate amount of ascites, increased since the 04/05/2018 CT, including interloop ascites in the mesentery. Progression of omental, peritoneal and mesenteric metastatic disease since the 04/05/2018 CT. Musculoskeletal: Regional skeleton unremarkable without acute or significant osseous abnormality. No evidence of osseous metastatic disease. IMPRESSION: 1. Mildly enlarged lymph nodes in the LOWER mediastinum in the RIGHT paracardiac location, some of which which have slightly increased in size since the CT 04/05/2018. 2. No acute cardiopulmonary disease. 3. Moderate amount of ascites, increased since the CT 04/05/2018. 4. Mildly enlarged LEFT periaortic lymph node at the level of the renal hila, measured above. Numerous small lymph nodes elsewhere in the abdomen including the gastrohepatic ligament and porta hepatis. These nodes have increased in size since the CT 04/05/2018 and indicate metastatic disease. 5. Progression of omental, peritoneal and mesenteric metastatic disease since the CT 04/05/2018. Electronically Signed   By: Evangeline Dakin M.D.   On: 06/06/2018 12:08   US Paracentesis  Result Date: 06/03/2018 INDICATION: 62 year old female with suspected malignant ascites. She presents for diagnostic and therapeutic paracentesis. EXAM: ULTRASOUND GUIDED  PARACENTESIS MEDICATIONS: None. COMPLICATIONS: None immediate. PROCEDURE: Informed written consent was obtained from the patient after a discussion of the risks, benefits and alternatives to treatment. A timeout was performed prior to the initiation of the procedure. Initial ultrasound scanning demonstrates a moderate amount of ascites within the right lower abdominal quadrant. The right lower abdomen was prepped and draped in the usual sterile  fashion. 1% lidocaine with epinephrine was used for local anesthesia. Following this, a 6 Fr Safe-T-Centesis catheter was introduced. An ultrasound image was saved for documentation purposes. The paracentesis was performed. The catheter was removed and a dressing was applied. The patient tolerated the procedure well without immediate post procedural complication. FINDINGS: A total of approximately 1900 mL of amber colored ascitic fluid was removed. Samples were sent to the laboratory as requested by the clinical team. IMPRESSION: Successful ultrasound-guided paracentesis yielding 1.9 liters of peritoneal fluid. Electronically Signed   By: Jacqulynn Cadet M.D.   On: 06/03/2018 16:21    Assessment and plan- Patient is a 62 y.o. female diagnosed with low-grade serous ovarian cancer who presents to symptom management clinic for acid reflux and   1.  Recurrent low-grade serous ovarian carcinoma-s/p optimal debulking and 3 cycles of adjuvant carboplatin-Taxol.  Unable to tolerate letrozole.  Progressed on anastrozole.  Currently  reinitiated carboplatinum Taxol with plans to image after 3 cycles.  Could also consider trametinib if she progresses.   2.  Weakness-likely related to deconditioning.  Slightly improved after fluids previously.  Discussed concerns of reaccumulation of ascites versus benefit.  Will give fluids today in clinic today and continue to monitor.  Patient encouraged to monitor weight and ensure she is getting ample protein intake.  3.  Acid reflux-May be related to steroids with chemotherapy. No bowel symptoms otherwise but recommend close monitoring. Start protonix.   4. Altered Mobility- Patient suffers from ovarian cancer, which impairs her ability to perform daily activities in and out of the home. A cane, walker, and/or crutch will not resolves issues with performing activities of daily living. A wheelchair will allow patient to safely perform daily activities. Patient can safely propel  the wheelchair in the home or has a caregiver who can provide assistance.   5.  Pain associated with neoplasm- reluctant to start medications but d/t pain and pain interfering with sleep discussed starting low dose tramadol alternating with tylenol. Patient is agreeable. Will also increase gabapentin for neuropathic pain to BID. She also questions acupuncture for pain. Advised this may not be helpful but could try. Names of local clinics provided.   6.  Anemia-chronic microcytic hypochromic anemia.  Rechecked iron studies today which reveal increased ferritin decrease saturation ratio and decreased TIBC. Hmg today 9.1; likely nadir. Continue oral iron as prescribed.  Could consider checking B12 and folate at next visit.   Follow-up with Dr. Grayland Ormond as scheduled for return to clinic sooner if ongoing or worsening of symptoms.  Visit Diagnosis 1. Borderline epithelial neoplasm of ovary   2. Iron deficiency anemia, unspecified iron deficiency anemia type     Patient expressed understanding and was in agreement with this plan. She also understands that She can call clinic at any time with any questions, concerns, or complaints.   Thank you for allowing me to participate in the care of this very pleasant patient.   Due to a language barrier, Spanish interpreter was present for all patient interactions.   Beckey Rutter, DNP, AGNP-C [place] at [facility] [phone] (work cell) [phone] (office)

## 2018-06-28 ENCOUNTER — Inpatient Hospital Stay (HOSPITAL_BASED_OUTPATIENT_CLINIC_OR_DEPARTMENT_OTHER): Payer: Self-pay | Admitting: Oncology

## 2018-06-28 ENCOUNTER — Other Ambulatory Visit: Payer: Self-pay

## 2018-06-28 ENCOUNTER — Telehealth: Payer: Self-pay | Admitting: *Deleted

## 2018-06-28 ENCOUNTER — Encounter: Payer: Self-pay | Admitting: Nurse Practitioner

## 2018-06-28 ENCOUNTER — Inpatient Hospital Stay: Payer: Self-pay

## 2018-06-28 ENCOUNTER — Inpatient Hospital Stay: Payer: Self-pay | Attending: Oncology

## 2018-06-28 VITALS — BP 107/74 | HR 106 | Temp 98.3°F | Resp 18 | Wt 175.0 lb

## 2018-06-28 DIAGNOSIS — D3910 Neoplasm of uncertain behavior of unspecified ovary: Secondary | ICD-10-CM

## 2018-06-28 DIAGNOSIS — R971 Elevated cancer antigen 125 [CA 125]: Secondary | ICD-10-CM

## 2018-06-28 DIAGNOSIS — D649 Anemia, unspecified: Secondary | ICD-10-CM

## 2018-06-28 DIAGNOSIS — Z5111 Encounter for antineoplastic chemotherapy: Secondary | ICD-10-CM | POA: Insufficient documentation

## 2018-06-28 DIAGNOSIS — Z803 Family history of malignant neoplasm of breast: Secondary | ICD-10-CM | POA: Insufficient documentation

## 2018-06-28 DIAGNOSIS — E860 Dehydration: Secondary | ICD-10-CM

## 2018-06-28 DIAGNOSIS — R5383 Other fatigue: Secondary | ICD-10-CM

## 2018-06-28 DIAGNOSIS — C569 Malignant neoplasm of unspecified ovary: Secondary | ICD-10-CM | POA: Insufficient documentation

## 2018-06-28 DIAGNOSIS — R531 Weakness: Secondary | ICD-10-CM | POA: Insufficient documentation

## 2018-06-28 DIAGNOSIS — C786 Secondary malignant neoplasm of retroperitoneum and peritoneum: Secondary | ICD-10-CM | POA: Insufficient documentation

## 2018-06-28 DIAGNOSIS — Z87891 Personal history of nicotine dependence: Secondary | ICD-10-CM

## 2018-06-28 DIAGNOSIS — Z79899 Other long term (current) drug therapy: Secondary | ICD-10-CM | POA: Insufficient documentation

## 2018-06-28 DIAGNOSIS — M79661 Pain in right lower leg: Secondary | ICD-10-CM

## 2018-06-28 DIAGNOSIS — F419 Anxiety disorder, unspecified: Secondary | ICD-10-CM | POA: Insufficient documentation

## 2018-06-28 DIAGNOSIS — Z5189 Encounter for other specified aftercare: Secondary | ICD-10-CM

## 2018-06-28 DIAGNOSIS — G629 Polyneuropathy, unspecified: Secondary | ICD-10-CM | POA: Insufficient documentation

## 2018-06-28 DIAGNOSIS — R110 Nausea: Secondary | ICD-10-CM | POA: Insufficient documentation

## 2018-06-28 DIAGNOSIS — Z8542 Personal history of malignant neoplasm of other parts of uterus: Secondary | ICD-10-CM | POA: Insufficient documentation

## 2018-06-28 DIAGNOSIS — M129 Arthropathy, unspecified: Secondary | ICD-10-CM | POA: Insufficient documentation

## 2018-06-28 DIAGNOSIS — K30 Functional dyspepsia: Secondary | ICD-10-CM | POA: Insufficient documentation

## 2018-06-28 DIAGNOSIS — Z90710 Acquired absence of both cervix and uterus: Secondary | ICD-10-CM

## 2018-06-28 DIAGNOSIS — C778 Secondary and unspecified malignant neoplasm of lymph nodes of multiple regions: Secondary | ICD-10-CM

## 2018-06-28 DIAGNOSIS — Z90722 Acquired absence of ovaries, bilateral: Secondary | ICD-10-CM | POA: Insufficient documentation

## 2018-06-28 DIAGNOSIS — M79662 Pain in left lower leg: Secondary | ICD-10-CM

## 2018-06-28 DIAGNOSIS — J45909 Unspecified asthma, uncomplicated: Secondary | ICD-10-CM | POA: Insufficient documentation

## 2018-06-28 LAB — COMPREHENSIVE METABOLIC PANEL
ALBUMIN: 3.1 g/dL — AB (ref 3.5–5.0)
ALT: 13 U/L (ref 0–44)
AST: 18 U/L (ref 15–41)
Alkaline Phosphatase: 58 U/L (ref 38–126)
Anion gap: 8 (ref 5–15)
BUN: 10 mg/dL (ref 8–23)
CO2: 25 mmol/L (ref 22–32)
Calcium: 8.8 mg/dL — ABNORMAL LOW (ref 8.9–10.3)
Chloride: 105 mmol/L (ref 98–111)
Creatinine, Ser: 0.43 mg/dL — ABNORMAL LOW (ref 0.44–1.00)
GFR calc Af Amer: >60 mL/min (ref >60)
GFR calc non Af Amer: >60 mL/min (ref >60)
Glucose, Bld: 121 mg/dL — ABNORMAL HIGH (ref 70–99)
Potassium: 3.5 mmol/L (ref 3.5–5.1)
Sodium: 138 mmol/L (ref 135–145)
Total Bilirubin: 0.6 mg/dL (ref 0.3–1.2)
Total Protein: 7.7 g/dL (ref 6.5–8.1)

## 2018-06-28 LAB — CBC WITH DIFFERENTIAL/PLATELET
ABS IMMATURE GRANULOCYTES: 0 10*3/uL (ref 0.00–0.07)
BASOS PCT: 0 %
Basophils Absolute: 0 10*3/uL (ref 0.0–0.1)
EOS ABS: 0.1 10*3/uL (ref 0.0–0.5)
Eosinophils Relative: 2 %
HCT: 28.6 % — ABNORMAL LOW (ref 36.0–46.0)
Hemoglobin: 8.5 g/dL — ABNORMAL LOW (ref 12.0–15.0)
IMMATURE GRANULOCYTES: 0 %
Lymphocytes Relative: 44 %
Lymphs Abs: 1 10*3/uL (ref 0.7–4.0)
MCH: 23.2 pg — ABNORMAL LOW (ref 26.0–34.0)
MCHC: 29.7 g/dL — ABNORMAL LOW (ref 30.0–36.0)
MCV: 77.9 fL — ABNORMAL LOW (ref 80.0–100.0)
Monocytes Absolute: 0.3 10*3/uL (ref 0.1–1.0)
Monocytes Relative: 12 %
NEUTROS PCT: 42 %
Neutro Abs: 1 10*3/uL — ABNORMAL LOW (ref 1.7–7.7)
PLATELETS: 280 10*3/uL (ref 150–400)
RBC: 3.67 MIL/uL — ABNORMAL LOW (ref 3.87–5.11)
RDW: 18.8 % — ABNORMAL HIGH (ref 11.5–15.5)
WBC: 2.4 10*3/uL — ABNORMAL LOW (ref 4.0–10.5)
nRBC: 0 % (ref 0.0–0.2)

## 2018-06-28 LAB — FOLATE: Folate: 10.9 ng/mL (ref >5.9)

## 2018-06-28 LAB — VITAMIN B12: Vitamin B-12: 261 pg/mL (ref 180–914)

## 2018-06-28 LAB — MAGNESIUM: Magnesium: 1.7 mg/dL (ref 1.7–2.4)

## 2018-06-28 MED ORDER — DEXAMETHASONE SODIUM PHOSPHATE 10 MG/ML IJ SOLN
10.0000 mg | Freq: Once | INTRAMUSCULAR | Status: AC
  Administered 2018-06-28: 10 mg via INTRAVENOUS
  Filled 2018-06-28: qty 1

## 2018-06-28 MED ORDER — SODIUM CHLORIDE 0.9 % IV SOLN
Freq: Once | INTRAVENOUS | Status: AC
  Administered 2018-06-28: 12:00:00 via INTRAVENOUS
  Filled 2018-06-28: qty 250

## 2018-06-28 NOTE — Patient Instructions (Signed)
We will recheck labs on wednesday before treatment.  Iron levels are slightly low. I have attached some information on how to increase iron in your diet and supplementation.  Call with any questions or concerns.    Iron tablets, capsules, extended-release tablets What is this medicine? IRON (AHY ern) replaces iron that is essential to healthy red blood cells. Iron is used to treat iron deficiency anemia. Anemia may cause problems like tiredness, shortness of breath, or slowed growth in children. Only take iron if your doctor has told you to. Do not treat yourself with iron if you are feeling tired. Most healthy people get enough iron in their diets, particularly if they eat cereals, meat, poultry, and fish. This medicine may be used for other purposes; ask your health care provider or pharmacist if you have questions. COMMON BRAND NAME(S): Cheri Kearns, [place], [place], Feosol Complete, [employer], [place], [place], [place], [place], Ferrimin, Barrister's clerk, Ferrocite, Hemocyte, Nephro-Fer, NovaFerrum, ProFe, Proferrin ES, Slow Fe, Slow Iron, Tandem What should I tell my health care provider before I take this medicine? They need to know if you have any of these conditions: -frequently drink alcohol -bowel disease -hemolytic anemia -iron overload (hemochromatosis, hemosiderosis) -liver disease -problems with swallowing -stomach ulcer or other stomach problems -an unusual or allergic reaction to iron, other medicines, foods, dyes, or preservatives -pregnant or trying to get pregnant -breast-feeding How should I use this medicine? Take this medicine by mouth with a glass of water or fruit juice. Follow the directions on the prescription label. Swallow whole. Do not crush or chew. Take this medicine in an upright or sitting position. Try to take any bedtime doses at least 10 minutes before lying down. You may take this medicine with food. Take your medicine at regular intervals. Do  not take your medicine more often than directed. Do not stop taking except on your doctor's advice. Talk to your pediatrician regarding the use of this medicine in children. While this drug may be prescribed for selected conditions, precautions do apply. Overdosage: If you think you have taken too much of this medicine contact a poison control center or emergency room at once. NOTE: This medicine is only for you. Do not share this medicine with others. What if I miss a dose? If you miss a dose, take it as soon as you can. If it is almost time for your next dose, take only that dose. Do not take double or extra doses. What may interact with this medicine? If you are taking this iron product, you should not take iron in any other medicine or dietary supplement. This medicine may also interact with the following medications: -alendronate -antacids -cefdinir -chloramphenicol -cholestyramine -deferoxamine -dimercaprol -etidronate -medicines for stomach ulcers or other stomach problems -pancreatic enzymes -quinolone antibiotics (examples: Cipro, Floxin, Levaquin, Tequin and others) -risedronate -tetracycline antibiotics (examples: doxycycline, tetracycline, minocycline, and others) -thyroid hormones This list may not describe all possible interactions. Give your health care provider a list of all the medicines, herbs, non-prescription drugs, or dietary supplements you use. Also tell them if you smoke, drink alcohol, or use illegal drugs. Some items may interact with your medicine. What should I watch for while using this medicine? Use iron supplements only as directed by your health care professional. Elizabeth Davenport will need important blood work while you are taking this medicine. It may take 3 to 6 months of therapy to treat low iron levels. Pregnant women should follow the dose and length of iron treatment as directed by  their doctors. Do not use iron longer than prescribed, and do not take a higher dose  than recommended. Long-term use may cause excess iron to build-up in the body. Do not take iron with antacids. If you need to take an antacid, take it 2 hours after a dose of iron. What side effects may I notice from receiving this medicine? Side effects that you should report to your doctor or health care professional as soon as possible: -allergic reactions like skin rash, itching or hives, swelling of the face, lips, or tongue -blue lips, nails, or palms -dark colored stools (this may be due to the iron, but can indicate a more serious condition) -drowsiness -pain with or difficulty swallowing -pale or clammy skin -seizures -stomach pain -unusually weak or tired -vomiting -weak, fast, or irregular heartbeat Side effects that usually do not require medical attention (report to your doctor or health care professional if they continue or are bothersome): -constipation -indigestion -nausea or stomach upset This list may not describe all possible side effects. Call your doctor for medical advice about side effects. You may report side effects to FDA at 1-800-FDA-1088. Where should I keep my medicine? Keep out of the reach of children. Even small amounts of iron can be harmful to a child. Store at room temperature between 15 and 30 degrees C (59 and 86 degrees F). Keep container tightly closed. Throw away any unused medicine after the expiration date. NOTE: This sheet is a summary. It may not cover all possible information. If you have questions about this medicine, talk to your doctor, pharmacist, or health care provider.  2019 Elsevier/Gold Standard (2007-10-29 17:03:41)  Dieta con alto contenido de hierro Iron-Rich Diet  El hierro es un mineral que ayuda al organismo a producir hemoglobina. La hemoglobina es una protena de los glbulos rojos que transporta el oxgeno a los tejidos del cuerpo. Consumir muy poco hierro [employer] se sienta dbil y [place], y aumentar su riesgo de  contraer infecciones. El hierro es un componente natural de muchos alimentos y muchos otros alimentos tienen hierro agregado (alimentos fortificados con hierro). Es posible que deba seguir una dieta con alto contenido de hierro si no tiene suficiente hierro en el cuerpo debido a Actuary. La cantidad de potasio que necesita diariamente depende de su edad, su sexo y las afecciones que pueda [place]. Siga las indicaciones de su mdico o un especialista en alimentacin y nutricin (nutricionista) sobre la cantidad de hierro que debera consumir Armed forces operational officer. Cules son algunos consejos para seguir este plan? Leer las etiquetas de los alimentos  Lea las etiquetas de los alimentos para saber la cantidad de miligramos (mg) de hierro que hay en cada porcin. Al cocinar  Cocine los alimentos en ollas de hierro.  Tome estas medidas para que el cuerpo pueda absorber el hierro de ciertos alimentos con ms facilidad: ? Antes de cocinarlos, remoje los frijoles durante la noche. ? Remoje los cereales integrales durante la noche y culelos antes de usarlos para cocinar. ? Prepare un fermento con las harinas antes del horneado, por ejemplo, usando levadura en la masa del pan. Planificacin de las comidas  Cuando coma alimentos que contengan hierro, debe comerlos con alimentos ricos en vitamina C. Entre ellos, se incluyen las [place], los pimientos, los tomates, las papas y Education officer, environmental. La vitamina C ayuda al organismo a Research scientist (physical sciences). Informacin general  Tome los suplementos de hierro solamente como se lo haya indicado el mdico. La sobredosis de hierro  puede ser potencialmente mortal. Si le recetan suplementos de hierro, tmelos con jugo de naranja o un suplemento de vitaminaC.  Cuando coma alimentos fortificados con hierro o tome un suplemento de hierro, tambin debe consumir alimentos que contengan hierro naturalmente, como carne, aves y pescado. Consumir alimentos ricos en hierro naturalmente  ayuda al organismo a absorber el hierro que se aade a otros alimentos o que contiene un suplemento.  Ciertos alimentos y bebidas impiden que el cuerpo absorba el hierro adecuadamente. No consuma estos alimentos en la misma comida que aquellos con alto contenido de hierro o con suplementos de Sport and exercise psychologist. Estos alimentos incluyen: ? Caf, t negro y vino tinto. ? Leche, productos lcteos y alimentos con alto contenido de calcio. ? Porotos y soja. ? Cereales integrales. Qu tipos de alimentos debo consumir? Frutas Ciruelas pasas. Pasas de uva. Consuma frutas con alto contenido de vitamina C, por ejemplo, naranjas, pomelos y fresas, junto con alimentos ricos en hierro. Verduras [place]spin[place] (cocida). Guisantes. Brcoli. Verduras fermentadas. Consuma verduras ricas en vitamina C, como las verduras de [place], las papas, los morrones y los tomates, junto con los alimentos ricos en hierro. Cereales Cereales para el desayuno fortificados con hierro. Pan de trigo integral fortificado con hierro. Arroz enriquecido. Granos germinados. Carnes y otras protenas Hgado de res. Ostras. Carne de vaca. Camarones. Pavo. Pollo. Atn. Sardinas. Garbanzos. Frutos secos. Tofu. Semillas de calabaza. Bebidas Jugo de tomate. Jugo de naranja recin exprimido. Jugo de ciruelas. T de hibisco. Batidos instantneos fortificados para el desayuno. Dulces y Contractor. Alios y condimentos Tahini. Salsa de soja fermentada. Otros alimentos Germen de trigo. Es posible que los productos mencionados arriba no formen una lista completa de las bebidas o los alimentos recomendados. Comunquese con un nutricionista para obtener ms informacin. Qu alimentos debo evitar? Cereales Cereales integrales. Cereal de salvado. Harina de salvado. Avena. Carnes y otras protenas Soja. Productos elaborados a base de protena de la soja. Frijoles negros. Lentejas. Frijoles mungo. Guisantes secos. Lcteos Leche. Crema. Queso. Yogur.  Requesn. Bebidas Caf. T negro. Vino tinto. Dulces y [employer]. Chocolate. Helados. Otros alimentos [place]. Organo. Grandes cantidades de perejil. Es posible que los productos que se enumeran ms arriba no sean una lista completa de los alimentos y las bebidas que se Higher education careers adviser. Comunquese con un nutricionista para obtener ms informacin. Resumen  El hierro es un mineral que ayuda al organismo a producir hemoglobina. La hemoglobina es una protena de los glbulos rojos que transporta el oxgeno a los tejidos del cuerpo.  El hierro es un componente natural de muchos alimentos y muchos otros alimentos tienen hierro agregado (alimentos fortificados con hierro).  Cuando coma alimentos que contengan hierro, debe comerlos con alimentos ricos en vitamina C. La vitamina C ayuda al organismo a Research scientist (physical sciences).  Ciertos alimentos y bebidas impiden que el cuerpo absorba el hierro adecuadamente, como los cereales integrales y los productos lcteos. Debe evitar consumir estos alimentos en la misma comida que aquellos con alto contenido de hierro o con suplementos de hierro. Esta informacin no tiene Marine scientist el consejo del mdico. Asegrese de hacerle al mdico cualquier pregunta que tenga. Document Released: 03/29/2006 Document Revised: 08/21/2017 Document Reviewed: 08/21/2017 Elsevier Interactive Patient Education  2019 [employer].  Deshidratacin en los adultos Dehydration, Adult  La deshidratacin es un cuadro clnico que se caracteriza por la cantidad insuficiente de lquido o agua en el organismo. Esto ocurre cuando se pierden ms lquidos de los que se ingieren. Los [employer] Corporation, [place]  los riones, el cerebro y el corazn, no pueden funcionar sin la cantidad [place] de lquidos. Cualquier prdida de lquidos del organismo puede causar deshidratacin. La deshidratacin puede ser leve o grave. Este cuadro clnico se debe tratar de inmediato para evitar que se  agrave. Cules son las causas? Esta afeccin puede ser causada por lo siguiente:  Vmitos.  Diarrea.  Sudoracin excesiva, por ejemplo, por la exposicin al calor o por la actividad fsica.  Ingesta insuficiente de lquido, especialmente en estos casos: ? Cuando se est enfermo. ? Mientras se realizan actividades que exigen mucha [place].  Miccin excesiva.  Cristy Hilts.  Una infeccin.  Determinados medicamentos, como aquellos que estimulan al cuerpo a eliminar el exceso de lquido (diurticos).  Imposibilidad de acceder a agua potable segura.  Disminucin de la capacidad fsica para obtener el agua y los alimentos [place]. Qu incrementa el riesgo? Es ms probable que Orthoptist en las personas que:  Tienen una enfermedad prolongada (crnica) que no est bien tratada, como diabetes, enfermedades cardacas o renales.  Son [place] de [place].  Tienen alguna discapacidad.  Viven a gran altitud.  Practican deportes de resistencia. Cules son los signos o los sntomas? Los sntomas de la deshidratacin leve pueden incluir los siguientes:  Sed.  Labios secos.  Sequedad leve en la boca.  Piel seca y caliente.  Mareos. Los sntomas de la deshidratacin moderada pueden incluir los siguientes:  [place].  Calambres musculares.  Orina de color oscuro. La orina puede ser color t.  Disminucin de la produccin de [place].  Disminucin en la produccin de lgrimas.  Latido cardaco irregular o ms rpido que lo normal (palpitaciones).  Dolor de [place].  Sensacin de desvanecimiento, especialmente al ponerse de pie.  Desmayos (sncope). Los sntomas de deshidratacin grave pueden incluir los siguientes:  Cambios en la piel, por ejemplo: ? Piel fra y hmeda. ? Piel manchada (moteada) o plida. ? Piel que no vuelve rpidamente a la normalidad cuando se la suelta luego de Nutritional therapist (persistencia del pliegue  cutneo).  Cambios en los lquidos corporales, por ejemplo: ? Mucha sed. ? Ausencia de la produccin de lgrimas. ? Incapacidad de transpirar cuando la temperatura corporal es alta, por ejemplo, cuando hace calor. ? Produccin muy escasa de [place].  Cambios en los signos vitales, por ejemplo: ? Pulso dbil. ? Pulso que supera los 100latidos por minuto cuando se est sentado quieto. ? Respiracin rpida. ? Presin arterial baja.  Otros cambios, por ejemplo: ? Ojos hundidos. ? Manos y pies fros. ? Confusin. ? Falta de energa Engineer, manufacturing). ? Dificultad para despertarse. ? Prdida de peso a Control and instrumentation engineer. ? Prdida del conocimiento. Cmo se diagnostica? Esta afeccin se diagnostica en funcin de los sntomas y de un examen fsico. Pueden hacerle anlisis de sangre y de orina para confirmar el diagnstico. Cmo se trata esta afeccin? El tratamiento de este cuadro clnico depende de la gravedad. La deshidratacin leve o moderada a menudo puede tratarse en la casa. El tratamiento se debe comenzar de inmediato. No espere hasta que la deshidratacin sea grave. La deshidratacin grave es Engineer, maintenance (IT) y debe tratarse en un hospital. El tratamiento de la deshidratacin leve puede incluir lo siguiente:  Beber ms lquidos.  Reemplazar las sales y [place] (electrolitos) que se pueden haber perdido. El tratamiento de la deshidratacin moderada puede incluir lo siguiente:  Valentino Saxon solucin de rehidratacin oral (SRO). Se trata de una bebida que lo ayuda a [employer] lquidos y  los electrolitos (rehidratarse). Se la puede encontrar en farmacias y tiendas minoristas. El tratamiento de la deshidratacin grave puede incluir lo siguiente:  Recibir lquidos a travs de un tubo (catter) intravenoso.  Recibir una solucin de electrolitos a travs de una sonda de alimentacin que se coloca a travs de la nariz hasta el estmago (sonda nasogstrica o sonda NG).  Corregir las  [facility].  Tratar la causa preexistente de la deshidratacin. Siga estas indicaciones en su casa:  Si el mdico se lo indic, beba una SRO: ? Para preparar una SRO, siga las indicaciones del envase. ? Comience por beber pequeas cantidades, aproximadamente taza (160ml) cada 5 a 68minutos. ? Aumente lentamente la cantidad que bebe hasta que haya ingerido la cantidad recomendada por el mdico.  Beba suficiente lquido transparente para mantener la orina clara o de color amarillo plido. Si le indicaron que beba una SRO, termine primero la solucin y luego empiece a beber lentamente otros lquidos transparentes. Beba lquidos, por ejemplo: ? [place]. No beba solo agua. Esto puede derivar en la escasez de sal (sodio) en el organismo (hiponatremia). ? Cubitos de hielo. ? Jugo de frutas con agregado de agua (jugo de frutas diluido). ? Bebidas deportivas de bajas caloras.  Evite lo siguiente: ? Alcohol. ? Bebidas con mucha azcar. Entre estas se incluyen las bebidas deportivas ricas en caloras, el jugo de frutas sin diluir y los refrescos o gaseosas. ? Cafena. ? Alimentos muy grasos o azucarados.  Tome los medicamentos de venta libre y los recetados solamente como se lo haya indicado el mdico.  No tome comprimidos de [place]. Esto puede aumentar la concentracin de sodio en el cuerpo (hipernatremia).  Coma alimentos con un equilibrio sano de electrolitos, como bananas, naranjas, papas, tomates y espinaca.  Concurra a todas las visitas de control como se lo haya indicado el mdico. Esto es importante. Comunquese con un mdico si:  Siente dolor abdominal que: ? [place]peora. ? Se siente en una sola zona (localizado).  Tiene una erupcin cutnea.  Presenta rigidez en el cuello.  Se siente ms irritable o sensible de lo habitual.  Tiene ms sueo o le cuesta ms despertarse de lo habitual.  Se siente dbil o mareado.  Tiene mucha sed.  Solo ha orinado una  cantidad pequea de [place] de color muy oscuro en el trmino de 6 a 8horas. Solicite ayuda de inmediato si:  Tiene sntomas de deshidratacin grave.  No puede beber lquidos sin vomitar.  Los sntomas empeoran con Dispensing optician.  Tiene fiebre.  Tiene un dolor de cabeza intenso.  Tiene vmitos o diarrea que: ? Empeoran. ? No desaparecen.  Observa sangre o una sustancia verde (bilis) en el vmito.  Lollie Marrow en la materia fecal. Esto puede hacer que la materia fecal sea negra y de aspecto alquitranado.  No ha orinado en el trmino de 6 a 8horas.  Se desmaya.  La frecuencia cardaca mientras est sentado quieto supera los 100latidos por minuto.  Tiene dificultad para respirar. Esta informacin no tiene Marine scientist el consejo del mdico. Asegrese de hacerle al mdico cualquier pregunta que tenga. Document Released: 06/12/2005 Document Revised: 09/14/2016 Document Reviewed: 08/06/2015 Elsevier Interactive Patient Education  2019 [employer].

## 2018-06-28 NOTE — Telephone Encounter (Signed)
Daughter called asking that patient come in for a check up and labs prior to next chemotherapy appointment,  she is lethargic. Please advise if you want her seen in [facility] or just a lab visit.

## 2018-06-28 NOTE — Telephone Encounter (Signed)
Appointment accepted for 1015 for lab then see NP

## 2018-06-28 NOTE — Progress Notes (Signed)
[place]  [phone] [phone]) [phone]  ID: Elizabeth Davenport OB: 08/02/1955  MR#: [id number]  CWC#:[id number]  Patient Care Team: Letta Median, MD as PCP - General (Family Medicine) Clent Jacks, RN as Registered Nurse Rico Junker, RN as Registered Nurse Theodore Demark, RN as Registered Nurse  CHIEF COMPLAINT: Progressive low-grade ovarian malignancy  INTERVAL HISTORY: Patient returns to clinic today for further evaluation and consideration of cycle 2 of carboplatin and Taxol.  She had difficulty with cycle 1 with increased nausea, poor appetite, and decreased performance status.  Currently she feels well and back to her baseline.  She has chronic weakness and fatigue. She does not complain of any further abdominal pain or bloating. She has no neurologic complaints.  She denies any fevers.  She has a fair appetite and denies weight loss.  She has no chest pain or shortness of breath. She denies any nausea, vomiting, constipation, or diarrhea.  She has no urinary complaints.  Patient offers no further specific complaints today.  REVIEW OF SYSTEMS:   Review of Systems  Constitutional: Positive for malaise/fatigue. Negative for fever and weight loss.  Respiratory: Negative.  Negative for cough and shortness of breath.   Cardiovascular: Negative.  Negative for chest pain and leg swelling.  Gastrointestinal: Negative.  Negative for abdominal pain, constipation, diarrhea, nausea and vomiting.  Genitourinary: Negative.  Negative for dysuria.  Musculoskeletal: Negative.  Negative for back pain.  Skin: Negative.  Negative for rash.  Neurological: Positive for weakness. Negative for dizziness, sensory change, focal weakness and headaches.  Psychiatric/Behavioral: Negative.  The patient is not nervous/anxious.     As per HPI. Otherwise, a complete review of systems is negative.  PAST MEDICAL HISTORY: Past Medical History:  Diagnosis Date  .  Arthritis    left knee  . Asthma   . Endometrial cancer ([place]) 11/04/14  . PONV (postoperative nausea and vomiting)     PAST SURGICAL HISTORY: Past Surgical History:  Procedure Laterality Date  . ABDOMINAL HYSTERECTOMY    . LAPAROSCOPIC BILATERAL SALPINGO OOPHERECTOMY Bilateral 11/04/2014   Procedure: LAPAROSCOPIC BILATERAL SALPINGO OOPHORECTOMY;  Surgeon: Mellody Drown, MD;  Location: [facility] ORS;  Service: Gynecology;  Laterality: Bilateral;  . LAPAROSCOPIC HYSTERECTOMY N/A 11/04/2014   Procedure: HYSTERECTOMY TOTAL LAPAROSCOPIC;  Surgeon: Mellody Drown, MD;  Location: [facility] ORS;  Service: Gynecology;  Laterality: N/A;  . removed mass from thyriod      FAMILY HISTORY: Family History  Problem Relation Age of Onset  . Breast cancer Sister 18  . Asthma Mother   . Coronary artery disease Mother   . Diabetes Brother   . Diabetes Sister   . Diabetes Sister   . Diabetes Brother     ADVANCED DIRECTIVES (Y/N):  N  HEALTH MAINTENANCE: Social History   Tobacco Use  . Smoking status: Former Smoker    Packs/day: 0.25    Years: 8.00    Pack years: 2.00    Types: Cigarettes    Last attempt to quit: 06/29/1990    Years since quitting: 28.0  . Smokeless tobacco: Never Used  . Tobacco comment: Smoking History Cigarettes 1 cig per day; quit 22 years ago; smoked for 8 years  Substance Use Topics  . Alcohol use: No    Alcohol/week: 0.0 standard drinks    Comment: occasional   . Drug use: No     Colonoscopy:  PAP:  Bone density:  Lipid panel:  No Known Allergies  Current Outpatient Medications  Medication  Sig Dispense Refill  . acetaminophen (TYLENOL) 500 MG tablet Take 500 mg by mouth every 6 (six) hours as needed.    Marland Kitchen albuterol (PROVENTIL HFA;VENTOLIN HFA) 108 (90 BASE) MCG/ACT inhaler Inhale 2 puffs into the lungs every 6 (six) hours as needed for wheezing or shortness of breath.    . exemestane (AROMASIN) 25 MG tablet Take 1 tablet (25 mg total) by mouth daily after  breakfast. (Patient not taking: Reported on 06/21/2018) 30 tablet 3  . fluticasone (FLOVENT HFA) 220 MCG/ACT inhaler Take by mouth.    . gabapentin (NEURONTIN) 300 MG capsule Take 1 capsule (300 mg total) by mouth 2 (two) times daily. Neuropathic pain 30 capsule 0  . ondansetron (ZOFRAN) 8 MG tablet Take 1 tablet (8 mg total) by mouth 2 (two) times daily as needed for refractory nausea / vomiting. 30 tablet 2  . pantoprazole (PROTONIX) 40 MG tablet Take 1 tablet (40 mg total) by mouth daily. For acid reflux 90 tablet 3  . polyethylene glycol (MIRALAX) packet Take 17 g by mouth daily. 14 each 0  . prochlorperazine (COMPAZINE) 10 MG tablet Take 1 tablet (10 mg total) by mouth every 6 (six) hours as needed (Nausea or vomiting). 60 tablet 2  . senna-docusate (SENNA S) 8.6-50 MG tablet Take 1 tablet by mouth daily. 30 tablet 0  . traMADol (ULTRAM) 50 MG tablet Take 1 tablet (50 mg total) by mouth every 6 (six) hours as needed for moderate pain or severe pain. 5 tablet 0   No current facility-administered medications for this visit.     OBJECTIVE: Vitals:   07/03/18 0923  BP: 122/82  Pulse: 93  Temp: 98.6 F (37 C)     Body mass index is 32.99 kg/m.    ECOG FS:0 - Asymptomatic  General: Well-developed, well-nourished, no acute distress. Eyes: Pink conjunctiva, anicteric sclera. HEENT: Normocephalic, moist mucous membranes. Lungs: Clear to auscultation bilaterally. Heart: Regular rate and rhythm. No rubs, murmurs, or gallops. Abdomen: Soft, nontender, nondistended. No organomegaly noted, normoactive bowel sounds. Musculoskeletal: No edema, cyanosis, or clubbing. Neuro: Alert, answering all questions appropriately. Cranial nerves grossly intact. Skin: No rashes or petechiae noted. Psych: Normal affect.  LAB RESULTS:  Lab Results  Component Value Date   NA 139 07/03/2018   K 3.9 07/03/2018   CL 104 07/03/2018   CO2 27 07/03/2018   GLUCOSE 106 (H) 07/03/2018   BUN 10 07/03/2018    CREATININE 0.51 07/03/2018   CALCIUM 8.8 (L) 07/03/2018   PROT 7.7 07/03/2018   ALBUMIN 3.2 (L) 07/03/2018   AST 15 07/03/2018   ALT 15 07/03/2018   ALKPHOS 57 07/03/2018   BILITOT 0.5 07/03/2018   GFRNONAA >60 07/03/2018   GFRAA >60 07/03/2018    Lab Results  Component Value Date   WBC 4.9 07/03/2018   NEUTROABS 3.1 07/03/2018   HGB 9.1 (L) 07/03/2018   HCT 30.5 (L) 07/03/2018   MCV 78.4 (L) 07/03/2018   PLT 347 07/03/2018     STUDIES: Ct Chest W Contrast  Result Date: 06/06/2018 CLINICAL DATA:  63 year old with personal history of grade 1 endometrial cancer diagnosed in 2016 for which the patient underwent TAH-BSO; 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 She had omental recurrence in May, 2019 for which she is being treated with anastrozole. She presents now with rising CA-125 levels. EXAM: CT CHEST, ABDOMEN, AND PELVIS WITH CONTRAST TECHNIQUE: Multidetector CT imaging of the chest, abdomen and pelvis was performed following the standard protocol during  bolus administration of intravenous contrast. CONTRAST:  176mL ISOVUE-300 IOPAMIDOL INJECTION 61% IV. Oral contrast was also administered. COMPARISON:  CT abdomen and pelvis 04/05/2018, 10/31/2017, 09/22/2014. No prior chest CT. FINDINGS: CT CHEST FINDINGS Cardiovascular: Normal heart size. No pericardial effusion. No visible coronary atherosclerosis. No visible atherosclerosis involving the thoracic aorta or the proximal great vessels. Mediastinum/Nodes: LOWER mediastinal lymph nodes in the paracardiac fat adjacent to the RIGHT side of the heart, at least 2 of which have increased slightly in size since the 04/05/2018 CT. No pathologically enlarged lymph nodes elsewhere in the mediastinum, hila or axilla. No mediastinal masses. Normal-appearing esophagus. Visualized thyroid gland normal in appearance. Lungs/Pleura: Minimal linear scarring in the lower lobes and in the inferior right upper lobe.  Pulmonary parenchyma otherwise clear without localized airspace consolidation, interstitial disease, or parenchymal nodules or masses. Central airways patent without significant bronchial wall thickening. No pleural effusions. No pleural plaques or masses. Musculoskeletal: Regional skeleton unremarkable without acute or significant osseous abnormality. No evidence of osseous metastatic disease. Other: Approximate 1.4 cm subcutaneous mass involving the upper back visible on the initial image. CT ABDOMEN PELVIS FINDINGS Hepatobiliary: Multiple hepatic cysts as noted on prior examinations. No new or suspicious hepatic masses. Gallbladder normal in appearance without calcified gallstones. No biliary ductal dilation. Pancreas: Normal in appearance without evidence of mass, ductal dilation, or inflammation. Spleen: Normal in size and appearance. Adrenals/Urinary Tract: Normal appearing adrenal glands. Approximate 4 cm simple cyst arising from the mid RIGHT kidney as noted previously. No significant parenchymal abnormality involving either kidney. No hydronephrosis. No urinary tract calculi. Normal appearing decompressed urinary bladder. Stomach/Bowel: Stomach normal in appearance for the degree of distention. Normal-appearing small bowel. Descending colon, sigmoid colon and rectum completely decompressed. Sigmoid colon diverticulosis without evidence of acute diverticulitis. Remainder of the colon normal in appearance with moderate stool burden in the ascending and transverse colon. Normal appearing gas-filled appendix in the RIGHT UPPER pelvis extending to the midline. Vascular/Lymphatic: Mild aortic atherosclerosis without evidence of aneurysm. Retroaortic LEFT renal vein again noted. Normal-appearing portal venous and systemic venous systems. Mildly enlarged LEFT periaortic lymph node at the level of the renal hila measuring approximately 1.7 cm, increased in size since the 04/05/2018 CT. Numerous small lymph nodes  elsewhere in the abdomen including the gastrohepatic ligament and the porta hepatis. Reproductive: Surgically absent uterus and ovaries. No visible adnexal masses. Other: Moderate amount of ascites, increased since the 04/05/2018 CT, including interloop ascites in the mesentery. Progression of omental, peritoneal and mesenteric metastatic disease since the 04/05/2018 CT. Musculoskeletal: Regional skeleton unremarkable without acute or significant osseous abnormality. No evidence of osseous metastatic disease. IMPRESSION: 1. Mildly enlarged lymph nodes in the LOWER mediastinum in the RIGHT paracardiac location, some of which which have slightly increased in size since the CT 04/05/2018. 2. No acute cardiopulmonary disease. 3. Moderate amount of ascites, increased since the CT 04/05/2018. 4. Mildly enlarged LEFT periaortic lymph node at the level of the renal hila, measured above. Numerous small lymph nodes elsewhere in the abdomen including the gastrohepatic ligament and porta hepatis. These nodes have increased in size since the CT 04/05/2018 and indicate metastatic disease. 5. Progression of omental, peritoneal and mesenteric metastatic disease since the CT 04/05/2018. Electronically Signed   By: Evangeline Dakin M.D.   On: 06/06/2018 12:08   Ct Abdomen Pelvis W Contrast  Result Date: 06/06/2018 CLINICAL DATA:  63 year old with personal history of grade 1 endometrial cancer diagnosed in 2016 for which the patient underwent TAH-BSO; pathology of  the ovaries revealed BILATERAL ovarian serous adenocarcinoma. She had omental recurrence in May, 2019 for which she is being treated with anastrozole. She presents now with rising CA-125 levels. EXAM: CT CHEST, ABDOMEN, AND PELVIS WITH CONTRAST TECHNIQUE: Multidetector CT imaging of the chest, abdomen and pelvis was performed following the standard protocol during bolus administration of intravenous contrast. CONTRAST:  140mL ISOVUE-300 IOPAMIDOL INJECTION 61% IV. Oral  contrast was also administered. COMPARISON:  CT abdomen and pelvis 04/05/2018, 10/31/2017, 09/22/2014. No prior chest CT. FINDINGS: CT CHEST FINDINGS Cardiovascular: Normal heart size. No pericardial effusion. No visible coronary atherosclerosis. No visible atherosclerosis involving the thoracic aorta or the proximal great vessels. Mediastinum/Nodes: LOWER mediastinal lymph nodes in the paracardiac fat adjacent to the RIGHT side of the heart, at least 2 of which have increased slightly in size since the 04/05/2018 CT. No pathologically enlarged lymph nodes elsewhere in the mediastinum, hila or axilla. No mediastinal masses. Normal-appearing esophagus. Visualized thyroid gland normal in appearance. Lungs/Pleura: Minimal linear scarring in the lower lobes and in the inferior right upper lobe. Pulmonary parenchyma otherwise clear without localized airspace consolidation, interstitial disease, or parenchymal nodules or masses. Central airways patent without significant bronchial wall thickening. No pleural effusions. No pleural plaques or masses. Musculoskeletal: Regional skeleton unremarkable without acute or significant osseous abnormality. No evidence of osseous metastatic disease. Other: Approximate 1.4 cm subcutaneous mass involving the upper back visible on the initial image. CT ABDOMEN PELVIS FINDINGS Hepatobiliary: Multiple hepatic cysts as noted on prior examinations. No new or suspicious hepatic masses. Gallbladder normal in appearance without calcified gallstones. No biliary ductal dilation. Pancreas: Normal in appearance without evidence of mass, ductal dilation, or inflammation. Spleen: Normal in size and appearance. Adrenals/Urinary Tract: Normal appearing adrenal glands. Approximate 4 cm simple cyst arising from the mid RIGHT kidney as noted previously. No significant parenchymal abnormality involving either kidney. No hydronephrosis. No urinary tract calculi. Normal appearing decompressed urinary  bladder. Stomach/Bowel: Stomach normal in appearance for the degree of distention. Normal-appearing small bowel. Descending colon, sigmoid colon and rectum completely decompressed. Sigmoid colon diverticulosis without evidence of acute diverticulitis. Remainder of the colon normal in appearance with moderate stool burden in the ascending and transverse colon. Normal appearing gas-filled appendix in the RIGHT UPPER pelvis extending to the midline. Vascular/Lymphatic: Mild aortic atherosclerosis without evidence of aneurysm. Retroaortic LEFT renal vein again noted. Normal-appearing portal venous and systemic venous systems. Mildly enlarged LEFT periaortic lymph node at the level of the renal hila measuring approximately 1.7 cm, increased in size since the 04/05/2018 CT. Numerous small lymph nodes elsewhere in the abdomen including the gastrohepatic ligament and the porta hepatis. Reproductive: Surgically absent uterus and ovaries. No visible adnexal masses. Other: Moderate amount of ascites, increased since the 04/05/2018 CT, including interloop ascites in the mesentery. Progression of omental, peritoneal and mesenteric metastatic disease since the 04/05/2018 CT. Musculoskeletal: Regional skeleton unremarkable without acute or significant osseous abnormality. No evidence of osseous metastatic disease. IMPRESSION: 1. Mildly enlarged lymph nodes in the LOWER mediastinum in the RIGHT paracardiac location, some of which which have slightly increased in size since the CT 04/05/2018. 2. No acute cardiopulmonary disease. 3. Moderate amount of ascites, increased since the CT 04/05/2018. 4. Mildly enlarged LEFT periaortic lymph node at the level of the renal hila, measured above. Numerous small lymph nodes elsewhere in the abdomen including the gastrohepatic ligament and porta hepatis. These nodes have increased in size since the CT 04/05/2018 and indicate metastatic disease. 5. Progression of omental, peritoneal and  mesenteric metastatic disease since the CT 04/05/2018. Electronically Signed   By: Evangeline Dakin M.D.   On: 06/06/2018 12:08    ASSESSMENT: Progressive low-grade ovarian malignancy. PLAN:   1.  Progressive low-grade ovarian malignancy: CT scan results from June 06, 2018 reviewed independently and reported as above with clear progression of disease.  Patient's CA-125 is also increased at 134.0, today's result is pending.  Finally, fluid from paracentesis was positive for malignant cells.  Previously, patient underwent optimal surgical debulking and then completed 3 cycles of adjuvant carboplatinum and Taxol on February 26, 2018.  Patient was initially placed on maintenance letrozole, but could not tolerate treatment and this was switched to Aromasin.  Hospice and end-of-life care were previously discussed, but patient declined and wished to additional treatment.  Proceed with cycle 2 of carboplatinum and Taxol today.  Return to clinic in 3 weeks for further evaluation and consideration of cycle 3.   2.  History of endometrial cancer: Patient had total hysterectomy.  Imaging as above. 3.  Anemia: Hemoglobin is decreased, but stable at 9.1.  She was noted to have iron deficiency, therefore she will receive 510 mg IV Feraheme today.  4.  Leukopenia: Resolved.   5.  Anxiety: Continue Xanax as needed.  The entire visit was done in the presence of an interpreter.  Patient expressed understanding and was in agreement with this plan. She also understands that She can call clinic at any time with any questions, concerns, or complaints.   Cancer Staging Borderline epithelial neoplasm of ovary Staging form: Ovary, Fallopian Tube, and Primary Peritoneal Carcinoma, AJCC 8th Edition - Clinical stage from 01/11/2018: FIGO Stage III (cT3, cN0, cM0) - Signed by Lloyd Huger, MD on 01/11/2018  Endometrial cancer [facility]) Staging form: Corpus Uteri - Carcinoma, AJCC 7th Edition - Clinical: Stage I (T1,  N0, M0) - Unsigned  Ovarian carcinoma, unspecified laterality ([place]) Staging form: Ovary, AJCC 7th Edition - Clinical: Stage Unknown (T1c, NX, M0) - Signed by Forest Gleason, MD on 11/27/2014   Lloyd Huger, MD   07/05/2018 12:47 PM

## 2018-06-28 NOTE — Telephone Encounter (Signed)
I can see her. Labs and visit please. CBC, CMP and Mag

## 2018-07-01 NOTE — Progress Notes (Signed)
Symptom Management Consult note [facility]  Telephone:([phone] [phone]  Patient Care Team: Letta Median, MD as PCP - General (Family Medicine) Clent Jacks, RN as Registered Nurse Rico Junker, RN as Registered Nurse Theodore Demark, RN as Registered Nurse   Name of the patient: Elizabeth Davenport  [id number]  1956/02/15   Date of visit: 06/28/2018  Diagnosis: low grade serous endometrial cancer   Chief Complaint: Fatigue/Weakness   Current Treatment: S/p cycle 4 carbo/taxol  Oncology History: Patient was last seen in [facility] clinic on 06/21/2018 by Beckey Rutter, NP for acid reflux and leg weakness.  Symptoms present for past several days and has gradually worsened.  Weakness thought to be due to deconditioning and acid reflux due to steroids.  She was started on Protonix.  She was previously seen on 06/18/2018 in [facility] for bilateral leg pain/burning and tingling and constipation.  Thought to be peripheral neuropathy from recent chemo; carbo/Taxol.  She was given IV fluids and antiemetics and clinic.  Labs were ordered.  She was started on low-dose gabapentin.  Denied bowel regimen.  Started on MiraLAX with the addition of Senokot.  She was last seen by primary oncologist Dr. Grayland Ormond on 06/07/2018 for discussion of imaging and treatment planning.  Imaging revealed clear progression of disease with rising Ca1 25.  Recent paracentesis was positive for malignant cells.  Recommended reinitiation of chemo with carbo/Taxol.  They also discussed end-of-life care.  Reinitiated carbo/Taxol on 06/14/2018.  Oncology History   Elizabeth Davenport, presented as a 63 year old G2, P2 Hispanic female who developed light vaginal bleeding in early 2016.  Reports menopause in 2014.  Dr. Enzo Bi did a Pap that was normal.  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  Focally ER PR negative.  CT scan on 09/22/2014 abdomen/pelvis  negative for metastatic disease.  She was referred to gynecologic and exam of cervix was normal.  Slides were reviewed at [facility] and read as probable endometrial primary.  She underwent TLH-BSO on 11/04/2014 at [facility].  Right ovarian excresence noted at surgery.  Survey of the rest of the abdomen, including omentum, and right diaphragm was negative.  Further staging was not performed due to patient's refusal to accept blood products that she is a Jehovah's Witness.  Pathology at [facility] showed a 2.6 cm grade 1 endometrial cancer without LVSI or cervical involvement.  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  No spread noted but washings were positive.  In view of the path report, suggestive of two primaries, pathology was reviewed at [facility].  The review confirmed that the uterine tumor is in early grade 1 endometrial adenocarcinoma.  However the ovarian excresences (were read as low-grade serous carcinoma) were interpreted as serous borderline tumors by [facility] pathologists (multiple).  The recommendation was for surveillance.  CA 125 05/05/2015 8.1 11/03/2015 12.7 05/10/2016 16.2 11/08/2016 15.4 05/23/2017 30.6 07/09/2017 25.2  On 10/31/2017 she presented to [facility] complaining of left lower quadrant abdominal pain which showed interval development of omental and peritoneal implants/metastatic disease and small amount of ascites with multiple small nodularities in the left upper quadrant including the left hemidiaphragm consistent with peritoneal implants.  Mildly rounded nodular density/lymph nodes at the cardiophrenic angles new and concerning for metastatic disease.  CT-guided biopsy performed. Pathology: A.  Omentum, left anterior abdomen-positive for involvement by epithelial neoplasm of gynecologic origin  11/14/2017 204.4  12/13/2017-underwent diagnostic laparoscopy, omentectomy, optimally debulked, for  removal of mass.  Pathology-low-grade serous carcinoma in background of  implants of serous borderline tumor.  Positive cytology ' suspicious for malignancy'.  Recommended to start chemotherapy followed by hormonal therapy.    01/22/2018 322.0 02/05/2018 314.0 02/26/2018 75.7  She completed 3 cycles of adjuvant carboplatin-Taxol on 02/26/2018.  Was started on letrozole, but on 04/01/2018 call the clinic and refused to take more due to bone and low back pain.  Plan was to washout and start anastrozole.  Trametinib was discussed if progressive disease.   03/19/2018 22.1  Presented to ER on 04/05/2018 complaining of low back pain and urinary frequency.  She was treated for possible UTI.  CT scan showed diffuse nodular infiltration throughout the mesentery and omentum consistent with peritoneal carcinomatosis. Started anastrozole.   04/10/2018 93.8 06/03/2018 134.0  Seen in symptom management clinic for abdominal swelling and discomfort.  Underwent paracentesis which yielded 1.9 L of fluid on 06/04/2018.Marland Kitchen  Fluid positive for malignancy.  CT chest/abdomen/pelvis on 06/06/2018 showed progression.  CT chest/abdomen/pelvis scan 06/06/2018 showed progression.  Patient required paracentesis.  Fluid positive for malignant cells.     Ovarian carcinoma, unspecified laterality ([place])   11/25/2014 Initial Diagnosis    Ovarian cancer, bilateral     Subjective Data:  ECOG: 2 - Symptomatic, <50% confined to bed   Subjective:     Elizabeth Davenport is a 63 y.o. female who presents for evaluation of dehydration and weakness.  It has been present for 4 week and progressive.  Associated signs & symptoms: Intermittent mild abdominal pain, constipation, weakness.  Chronic problem since reinitiating chemotherapy.  History of worsening ovarian cancer.  Had recent paracentesis where 1.9 L of fluid was removed and found to be positive cytology for malignancy.  The following portions of the patient's history were reviewed and updated as appropriate: allergies, current medications, past family  history, past medical history, past social history, past surgical history and problem list. Review of Systems A comprehensive review of systems was negative except for: Constitutional: positive for fatigue Respiratory: positive for dyspnea on exertion Cardiovascular: positive for fatigue Gastrointestinal: positive for abdominal pain, constipation and nausea Musculoskeletal: positive for arthralgias, back pain, muscle weakness and myalgias Neurological: positive for weakness       Objective:    BP 107/74 (BP Location: Left Arm, Patient Position: Sitting) Comment: 121/87 standing  Pulse (!) 106 Comment: 116 standing  Temp 98.3 F (36.8 C) (Tympanic)   Resp 18   Wt 175 lb (79.4 kg)   LMP 11/04/2011   BMI 33.07 kg/m  General appearance: alert, cooperative, fatigued, no distress, mildly obese and pale Lungs: clear to auscultation bilaterally Heart: regular rate and rhythm, S1, S2 normal, no murmur, click, rub or gallop and Mild tachycardia resolved with fluids Abdomen: abnormal findings:  distended and obese Skin: Skin color, texture, turgor normal. No rashes or lesions Neurologic: Grossly normal    Assessment:     Weakness/fatigue d/t anemia and recent chemotherapy.  Plan:   Recurrent low-grade serous ovarian carcinoma: s/p debulking and 3 cycles of adjuvant carbo/Taxol.  Unable to tolerate letrozole.  Progressed on anastrozole.  Currently reinitiated carbo/Taxol with plans to image after 3 cycles.  Scheduled to return to clinic for cycle 2 on 07/03/2018.  Weakness/fatigue: Multifactorial.  Deconditioning and anemia.  Patient's hemoglobin is 8.5 d/t chemo.  Patient is Jehovah witness and would not accept blood products.  Is agreeable to iron should she need it.  Recent iron studies revealed elevated ferritin but a decreased iron saturation  ratio and TIBC.  Will check B12 and folate.   Tachycardia: Likely due to dehydration/hypovolemia.  Heart rate as high as 106.  Peripheral  neuropathy: Continue gabapentin BID.  Has scheduled acupuncture for early next week.  Constipation: Resolved.  Continue Senokot and MiraLAX as needed.  Plan: Give 1 L NaCl. Give 10 mg Decadron IV. Recheck blood pressure after fluids.  Tachycardia resolved and is 90. Stat labs.  Anemia.  8.5.  ANC 1.  Folate and B12 normal. Provided educational materials on how to increase iron in her diet and supplementation via iron tablets.  Patient is requesting a dose reduction of her chemotherapy.  Will relay a message to Dr. Grayland Ormond.  Greater than 50% was spent in counseling and coordination of care with this patient including but not limited to discussion of the relevant topics above (See A&P) including, but not limited to diagnosis and management of acute and chronic medical conditions.   Faythe Casa, NP 07/01/2018 3:26 PM

## 2018-07-03 ENCOUNTER — Inpatient Hospital Stay (HOSPITAL_BASED_OUTPATIENT_CLINIC_OR_DEPARTMENT_OTHER): Payer: Self-pay | Admitting: Oncology

## 2018-07-03 ENCOUNTER — Inpatient Hospital Stay (HOSPITAL_BASED_OUTPATIENT_CLINIC_OR_DEPARTMENT_OTHER): Payer: Self-pay | Admitting: Obstetrics and Gynecology

## 2018-07-03 ENCOUNTER — Inpatient Hospital Stay: Payer: Self-pay

## 2018-07-03 ENCOUNTER — Other Ambulatory Visit: Payer: Self-pay

## 2018-07-03 ENCOUNTER — Other Ambulatory Visit: Payer: Self-pay | Admitting: *Deleted

## 2018-07-03 VITALS — BP 122/82 | HR 93 | Temp 98.6°F | Resp 16 | Ht 61.0 in | Wt 174.6 lb

## 2018-07-03 VITALS — BP 122/82 | HR 93 | Temp 98.6°F | Ht 61.0 in | Wt 174.6 lb

## 2018-07-03 VITALS — BP 107/72 | HR 84 | Temp 98.4°F | Resp 18

## 2018-07-03 DIAGNOSIS — R971 Elevated cancer antigen 125 [CA 125]: Secondary | ICD-10-CM

## 2018-07-03 DIAGNOSIS — Z90722 Acquired absence of ovaries, bilateral: Secondary | ICD-10-CM

## 2018-07-03 DIAGNOSIS — Z87891 Personal history of nicotine dependence: Secondary | ICD-10-CM

## 2018-07-03 DIAGNOSIS — D3910 Neoplasm of uncertain behavior of unspecified ovary: Secondary | ICD-10-CM

## 2018-07-03 DIAGNOSIS — C786 Secondary malignant neoplasm of retroperitoneum and peritoneum: Secondary | ICD-10-CM

## 2018-07-03 DIAGNOSIS — C778 Secondary and unspecified malignant neoplasm of lymph nodes of multiple regions: Secondary | ICD-10-CM

## 2018-07-03 DIAGNOSIS — C569 Malignant neoplasm of unspecified ovary: Secondary | ICD-10-CM

## 2018-07-03 DIAGNOSIS — Z5111 Encounter for antineoplastic chemotherapy: Secondary | ICD-10-CM

## 2018-07-03 DIAGNOSIS — Z90710 Acquired absence of both cervix and uterus: Secondary | ICD-10-CM

## 2018-07-03 LAB — CBC WITH DIFFERENTIAL/PLATELET
Abs Immature Granulocytes: 0.01 10*3/uL (ref 0.00–0.07)
Basophils Absolute: 0 10*3/uL (ref 0.0–0.1)
Basophils Relative: 0 %
EOS ABS: 0.1 10*3/uL (ref 0.0–0.5)
Eosinophils Relative: 1 %
HCT: 30.5 % — ABNORMAL LOW (ref 36.0–46.0)
Hemoglobin: 9.1 g/dL — ABNORMAL LOW (ref 12.0–15.0)
Immature Granulocytes: 0 %
Lymphocytes Relative: 25 %
Lymphs Abs: 1.2 10*3/uL (ref 0.7–4.0)
MCH: 23.4 pg — ABNORMAL LOW (ref 26.0–34.0)
MCHC: 29.8 g/dL — ABNORMAL LOW (ref 30.0–36.0)
MCV: 78.4 fL — AB (ref 80.0–100.0)
Monocytes Absolute: 0.5 10*3/uL (ref 0.1–1.0)
Monocytes Relative: 10 %
Neutro Abs: 3.1 10*3/uL (ref 1.7–7.7)
Neutrophils Relative %: 64 %
Platelets: 347 10*3/uL (ref 150–400)
RBC: 3.89 MIL/uL (ref 3.87–5.11)
RDW: 19.9 % — ABNORMAL HIGH (ref 11.5–15.5)
WBC: 4.9 10*3/uL (ref 4.0–10.5)
nRBC: 0 % (ref 0.0–0.2)

## 2018-07-03 LAB — COMPREHENSIVE METABOLIC PANEL
ALT: 15 U/L (ref 0–44)
AST: 15 U/L (ref 15–41)
Albumin: 3.2 g/dL — ABNORMAL LOW (ref 3.5–5.0)
Alkaline Phosphatase: 57 U/L (ref 38–126)
Anion gap: 8 (ref 5–15)
BILIRUBIN TOTAL: 0.5 mg/dL (ref 0.3–1.2)
BUN: 10 mg/dL (ref 8–23)
CO2: 27 mmol/L (ref 22–32)
Calcium: 8.8 mg/dL — ABNORMAL LOW (ref 8.9–10.3)
Chloride: 104 mmol/L (ref 98–111)
Creatinine, Ser: 0.51 mg/dL (ref 0.44–1.00)
GFR calc Af Amer: >60 mL/min (ref >60)
Glucose, Bld: 106 mg/dL — ABNORMAL HIGH (ref 70–99)
Potassium: 3.9 mmol/L (ref 3.5–5.1)
Sodium: 139 mmol/L (ref 135–145)
TOTAL PROTEIN: 7.7 g/dL (ref 6.5–8.1)

## 2018-07-03 MED ORDER — SODIUM CHLORIDE 0.9 % IV SOLN
641.5000 mg | Freq: Once | INTRAVENOUS | Status: AC
  Administered 2018-07-03: 640 mg via INTRAVENOUS
  Filled 2018-07-03: qty 64

## 2018-07-03 MED ORDER — SODIUM CHLORIDE 0.9 % IV SOLN
Freq: Once | INTRAVENOUS | Status: AC
  Filled 2018-07-03: qty 250

## 2018-07-03 MED ORDER — DEXAMETHASONE SODIUM PHOSPHATE 10 MG/ML IJ SOLN
10.0000 mg | Freq: Once | INTRAMUSCULAR | Status: AC
  Administered 2018-07-03: 10 mg via INTRAVENOUS
  Filled 2018-07-03: qty 1

## 2018-07-03 MED ORDER — DIPHENHYDRAMINE HCL 50 MG/ML IJ SOLN
25.0000 mg | Freq: Once | INTRAMUSCULAR | Status: AC
  Administered 2018-07-03: 25 mg via INTRAVENOUS
  Filled 2018-07-03: qty 1

## 2018-07-03 MED ORDER — SODIUM CHLORIDE 0.9 % IV SOLN
155.0000 mg/m2 | Freq: Once | INTRAVENOUS | Status: AC
  Administered 2018-07-03: 300 mg via INTRAVENOUS
  Filled 2018-07-03: qty 50

## 2018-07-03 MED ORDER — FAMOTIDINE IN NACL 20-0.9 MG/50ML-% IV SOLN
20.0000 mg | Freq: Once | INTRAVENOUS | Status: AC
  Administered 2018-07-03: 20 mg via INTRAVENOUS
  Filled 2018-07-03: qty 50

## 2018-07-03 MED ORDER — PALONOSETRON HCL INJECTION 0.25 MG/5ML
0.2500 mg | Freq: Once | INTRAVENOUS | Status: AC
  Administered 2018-07-03: 0.25 mg via INTRAVENOUS
  Filled 2018-07-03: qty 5

## 2018-07-03 MED ORDER — SODIUM CHLORIDE 0.9 % IV SOLN
Freq: Once | INTRAVENOUS | Status: AC
  Administered 2018-07-03: 11:00:00 via INTRAVENOUS
  Filled 2018-07-03: qty 250

## 2018-07-03 MED ORDER — SODIUM CHLORIDE 0.9 % IV SOLN
510.0000 mg | Freq: Once | INTRAVENOUS | Status: AC
  Administered 2018-07-03: 510 mg via INTRAVENOUS
  Filled 2018-07-03: qty 17

## 2018-07-03 NOTE — Progress Notes (Signed)
Patient is here today to follow up on her ovarian cancer. Patient stated that her acid reflux had been under control at this time. Therefore, she had not started them. Patient stated that she stays tired and fatigued. Patient denied vomiting, diarrhea, constipation, fever or chills. Patient stated that she was nauseous this AM but took her medication and started to fill better.

## 2018-07-03 NOTE — Progress Notes (Signed)
Gynecologic Oncology Interval Note  Chief Complaint: Recurrent low grade serous ovarian cancer  Subjective:  Elizabeth Davenport, 63 year old female, diagnosed with recurrent low grade serous ovarian cancer, who returns to clinic today for pelvic exam. She completed 3 cycles of adjuvant carboplatin-Taxol on 02/26/18.  She was unable to tolerate letrozole and was switched to anastrozole.    Imaging on 06/06/2018 showed progression.  CT C/A/P W Contrast- 06/06/18 1. Mildly enlarged lymph nodes in the LOWER mediastinum in the RIGHT paracardiac location, some of which which have slightly increased in size since the CT 04/05/2018. 2. No acute cardiopulmonary disease. 3. Moderate amount of ascites, increased since the CT 04/05/2018. 4. Mildly enlarged LEFT periaortic lymph node at the level of the renal hila, measured above. Numerous small lymph nodes elsewhere in the abdomen including the gastrohepatic ligament and porta hepatis. These nodes have increased in size since the CT 04/05/2018 and indicate metastatic disease. 5. Progression of omental, peritoneal and mesenteric metastatic disease since the CT 04/05/2018.  CA 125 06/03/2018 134.0  She reinitiated adjuvant carboplatin-Taxol on 06/14/2018. Treatment complicated by weakness and anemia. 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 She has been agreeable to iron. She has seen Dr. Grayland Ormond today as well and plan is to proceed with cycle 2 today. She continues to report fatigue, weakness. She is on gabapentin for peripheral neuropathy.    Oncology History: Elizabeth Davenport is a 63 y.o. G2P2 woman who developed light vaginal bleeding in early 2016 after undergoing LMP/menopause in 2014. Dr DeFrancesco did PAP that was normal.  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  CT scan 09/22/14 abd/pelvis negative for metastatic disease.  She was referred to [place] and on exam the cervix  was normal.  Slides were reviewed at [facility] and read as probable endometrial primary.  She underwent TLH[facility]BSO Nov 04, 2014 at [facility].  Right ovarian excresence noted at surgery.  Survey of the rest of the abdomen, including the omentum and right diaphragm was negative.  Further staging not performed due to patient's refusal to accept blood products as she is a Jehovah's witness.  Path at [facility] showed 2.6 cm grade 1 endometrial cancer without LVSI or cervical involvement.  The ovaries both were found to have low grade serous cancers of 7 and 8 mm.  No spread noted, but washings were positive.   In view of the path report suggesting primary endometrial and uterine cancers I has the pathology reviewed at [facility].  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  However, the ovarian excresences that were read as low grade serous carcinoma were interpreted as serous borderline tumors by the [facility] Pathologists (several of them reviewed the case). The recommendation was for surveillance.   CA125 has been followed:  05/05/15 8.1 11/03/15 12.7 05/10/16 16.2 11/08/2016 15.4 05/23/17 30.6 07/09/2017 25.2 11/14/2017 204.4 01/22/2018 322.0 02/05/2018 314.0 02/26/2018 75.7 03/19/2018 22.1 04/10/2018 93.8  On 10/31/17 she presented to [facility] ED complaining of left lower quadrant abdominal pain.  10/31/17- CT Abdomen Pelvis W Contrast- 1. Interval development of omental and peritoneal implants/metastatic disease and small amount of ascites. Multiple small nodularities in the left upper quadrant under the left hemidiaphragm (series 2, image 23 consistent with peritoneal implants.2. Mildly rounded nodular densities/lymph nodes at the cardiophrenic angles, new compared to prior CT and concerning for metastatic disease. 3. Sigmoid diverticulosis. No bowel obstruction or active inflammation.  Normal appendix.  Diagnosis: A.  Omentum, left anterior abdomen; CT-guided biopsy: -Positive for  involvement by epithelial neoplasm of gynecologic origin  12/13/2017 she underwent diagnostic laparoscopy, omentectomy for removal of mass. Pathology Low grade serous carcinoma in a background of implants of serous borderline tumor. Positive cytology "SUSPICIOUS FOR MALIGNANCY". Recommended to start chemotherapy carbo/taxol followed by hormonal therapy which she did under the care of Dr Grayland Ormond.   Recently she went to the ER on 04/05/2018 for pain lower back, abdominal bloating, and frequent urination. She was diagnosed with possible UTI and treated with cephalexin. CT was also ordered.   CT scan 04/05/2018  IMPRESSION: 1. No renal or ureteral stone or obstruction. 2. No evidence of bowel obstruction or inflammation. 3. Diffuse nodular infiltration throughout the mesentery and omentum consistent with peritoneal carcinomatosis. Increased density in the pelvis likely represents free fluid related to metastatic disease, possibly with hemorrhage or proteinaceous material. 4. Prominence of retrocrural and right cardiophrenic angle lymph nodes, similar to prior study, likely metastatic. 5. Probable hepatic cysts.  She completed chemotherapy with Dr. Grayland Ormond with 3 cycles of adjuvant carboplatinum and Taxol on February 26, 2018. She was started letrozole but on 04/01/2018 called the clinic and refused to take more due to bone pain and low back pain. The plan was to "wash out" the drug and then start another anastrozole.  Problem List: Patient Active Problem List   Diagnosis Date Noted  . Refusal of blood transfusions as patient is Jehovah's Witness 11/30/2017  . 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 History of endometrial cancer 11/08/2016  . Serous cystadenoma with borderline malignant features ([place]) 11/08/2016  . Ovarian carcinoma, unspecified laterality ([place]) 11/25/2014  . Endometrial cancer ([place]) 11/04/2014  . S/P laparoscopic hysterectomy 11/04/2014    Past Medical  History: Past Medical History:  Diagnosis Date  . Arthritis    left knee  . Asthma   . Endometrial cancer ([place]) 11/04/14  . PONV (postoperative nausea and vomiting)     Past Surgical History: Past Surgical History:  Procedure Laterality Date  . ABDOMINAL HYSTERECTOMY    . LAPAROSCOPIC BILATERAL SALPINGO OOPHERECTOMY Bilateral 11/04/2014   Procedure: LAPAROSCOPIC BILATERAL SALPINGO OOPHORECTOMY;  Surgeon: Mellody Drown, MD;  Location: [facility] ORS;  Service: Gynecology;  Laterality: Bilateral;  . LAPAROSCOPIC HYSTERECTOMY N/A 11/04/2014   Procedure: HYSTERECTOMY TOTAL LAPAROSCOPIC;  Surgeon: Mellody Drown, MD;  Location: [facility] ORS;  Service: Gynecology;  Laterality: N/A;  . removed mass from thyriod      Family History: Family History  Problem Relation Age of Onset  . Breast cancer Sister 63  . Asthma Mother   . Coronary artery disease Mother   . Diabetes Brother   . Diabetes Sister   . Diabetes Sister   . Diabetes Brother     Social History: Social History   Socioeconomic History  . Marital status: Married    Spouse name: Not on file  . Number of children: Not on file  . Years of education: Not on file  . Highest education level: Not on file  Occupational History  . Not on file  Social Needs  . Financial resource strain: Not on file  . Food insecurity:    Worry: Not on file    Inability: Not on file  . Transportation needs:    Medical: Not on file    Non-medical: Not on file  Tobacco Use  . Smoking status: Former Smoker    Packs/day: 0.25    Years: 8.00  Pack years: 2.00    Types: Cigarettes    Last attempt to quit: 06/29/1990    Years since quitting: 28.0  . Smokeless tobacco: Never Used  . Tobacco comment: Smoking History Cigarettes 1 cig per day; quit 22 years ago; smoked for 8 years  Substance and Sexual Activity  . Alcohol use: No    Alcohol/week: 0.0 standard drinks    Comment: occasional   . Drug use: No  . Sexual activity: Yes    Partners: Male   Lifestyle  . Physical activity:    Days per week: Not on file    Minutes per session: Not on file  . Stress: Not on file  Relationships  . Social connections:    Talks on phone: Not on file    Gets together: Not on file    Attends religious service: Not on file    Active member of club or organization: Not on file    Attends meetings of clubs or organizations: Not on file    Relationship status: Not on file  . Intimate partner violence:    Fear of current or ex partner: Not on file    Emotionally abused: Not on file    Physically abused: Not on file    Forced sexual activity: Not on file  Other Topics Concern  . Not on file  Social History Narrative  . Not on file    Allergies: No Known Allergies  Current Medications: Current Outpatient Medications  Medication Sig Dispense Refill  . acetaminophen (TYLENOL) 500 MG tablet Take 500 mg by mouth every 6 (six) hours as needed.    Marland Kitchen albuterol (PROVENTIL HFA;VENTOLIN HFA) 108 (90 BASE) MCG/ACT inhaler Inhale 2 puffs into the lungs every 6 (six) hours as needed for wheezing or shortness of breath.    . exemestane (AROMASIN) 25 MG tablet Take 1 tablet (25 mg total) by mouth daily after breakfast. (Patient not taking: Reported on 06/21/2018) 30 tablet 3  . fluticasone (FLOVENT HFA) 220 MCG/ACT inhaler Take by mouth.    . gabapentin (NEURONTIN) 300 MG capsule Take 1 capsule (300 mg total) by mouth 2 (two) times daily. Neuropathic pain 30 capsule 0  . ondansetron (ZOFRAN) 8 MG tablet Take 1 tablet (8 mg total) by mouth 2 (two) times daily as needed for refractory nausea / vomiting. 30 tablet 2  . pantoprazole (PROTONIX) 40 MG tablet Take 1 tablet (40 mg total) by mouth daily. For acid reflux 90 tablet 3  . polyethylene glycol (MIRALAX) packet Take 17 g by mouth daily. 14 each 0  . prochlorperazine (COMPAZINE) 10 MG tablet Take 1 tablet (10 mg total) by mouth every 6 (six) hours as needed (Nausea or vomiting). 60 tablet 2  . senna-docusate  (SENNA S) 8.6-50 MG tablet Take 1 tablet by mouth daily. 30 tablet 0  . traMADol (ULTRAM) 50 MG tablet Take 1 tablet (50 mg total) by mouth every 6 (six) hours as needed for moderate pain or severe pain. 5 tablet 0   No current facility-administered medications for this visit.     Review of Systems General: Fatigue, weakness, decreased appetite Skin: no complaints Eyes: no complaints HEENT: no complaints Breasts: no complaints Pulmonary: Cough Cardiac: no complaints Gastrointestinal: Nausea Genitourinary/Sexual: no complaints Ob/Gyn: no complaints Musculoskeletal: low back pain (chronic and changed) Hematology: no complaints Neurologic/Psych: Peripheral neuropathy   Objective:  Physical Examination:  Today's Vitals   07/03/18 0955  BP: 122/82  Pulse: 93  Resp: 16  Temp: 98.6 F (  37 C)  TempSrc: Tympanic  Weight: 174 lb 9.6 oz (79.2 kg)  Height: 5\' 1"  (1.549 m)  PainSc: 0-No pain   Body mass index is 32.99 kg/m.  LMP 11/04/2011   ECOG Performance Status: 1- symptomatic but ambulatory  GENERAL: Patient is a well appearing female in no acute distress HEENT:  PERRL, neck supple with midline trachea. Thyroid without masses.  NODES:  No cervical, supraclavicular, axillary, or inguinal lymphadenopathy palpated.  LUNGS:  Clear to auscultation bilaterally.  No wheezes or rhonchi. HEART:  Regular rate and rhythm. No murmur appreciated. ABDOMEN:  Soft, nontender.  No organomegaly palpated.  Incisions well-healed.  No hernia.  No ascites.  No hepatosplenomegaly.  Port sites negative for mets or masses.  MSK:  No focal spinal tenderness to palpation. Full range of motion bilaterally in the upper extremities. EXTREMITIES:  No peripheral edema.   SKIN:  Clear with no obvious rashes or skin changes. No nail dyscrasia. NEURO:  Nonfocal. Well oriented.  Appropriate affect.  Pelvic: Exam Chaperoned by RN Vulva: normal appearing vulva with no masses, tenderness or lesions; Vagina:  normal vagina; Adnexa: surgically absent; Uterus: surgically absent, vaginal cuff well healed; Cervix: absent; Rectal: not indicated   Assessment:  Elizabeth Davenport is a 63 y.o. 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 She was managed expectantly.  Diagnosed with low-grade serous cancer s/p laparoscopic omentectomy 12/13/2017 with optimal debulking surgery followed by 3 cycles of carbo-taxol and letrozole (switched to anastrozole d/t side effects). Imaging on 06/06/18 consistent with progression. CA 125 was elevated also. She re-initiated carbo-taxol on 06/14/18 since she had responded well earlier in the year and is currently s/p 1 cycle.   Medical cormorbities: Obesity.   Jehovah's witness.  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    If she has progressive disease on carbo/taxol, trametinib is another therapeutic option that has been shown to have efficacy and is now FDA approved.   She will follow up with Dr. Grayland Ormond for chemo and return to our clinic in 3-4 months.   A total of 25 minutes were spent with the patient/family today; >50% was spent in education, counseling and coordination of care for low grade serous ovarian cancer. The visit was performed with Lockie Mola, the interpreter.   Beckey Rutter, DNP, AGNP-C [place] at [facility] [phone] (work cell) [phone] (office)  I personally interviewed and examined the patient. Agreed with the above/below plan of care. Patient/family questions were answered.  Mellody Drown, MD   CC:  Dr. Rubie Maid

## 2018-07-03 NOTE — Progress Notes (Signed)
Pt states using maritza as interpreter that she is not having any spotting or bleeding from vaginal area. She does not have pain, itching or dryness either from GYn standpoint. She does state that she is fatigued and weak and ccurrently on chemo

## 2018-07-04 ENCOUNTER — Encounter: Payer: Self-pay | Admitting: Pharmacy Technician

## 2018-07-04 NOTE — Progress Notes (Signed)
Patient has been approved for drug assistance by [employer] for Feraheme. The enrollment period is from 07/03/18-07/05/19 based on selfpay. First DOS covered is 07/03/18.

## 2018-07-10 ENCOUNTER — Ambulatory Visit: Payer: Self-pay

## 2018-07-10 ENCOUNTER — Ambulatory Visit: Payer: Self-pay | Admitting: Oncology

## 2018-07-10 ENCOUNTER — Other Ambulatory Visit: Payer: Self-pay

## 2018-07-21 NOTE — Progress Notes (Signed)
Elizabeth Davenport  [phone] [phone]) [phone]  ID: Elizabeth Davenport OB: [birth year]  MR#: [id number]  MWU#:[id number]  Patient Care Team: Letta Median, MD as PCP - General (Family Medicine) Clent Jacks, RN as Registered Nurse Rico Junker, RN as Registered Nurse Theodore Demark, RN as Registered Nurse  CHIEF COMPLAINT: Progressive low-grade ovarian malignancy  INTERVAL HISTORY: Patient returns to clinic today for further evaluation and consideration of cycle 3 of carboplatinum and Taxol.  She recently was having increasing abdominal discomfort and had paracentesis removing about 400 mL of fluid.  She continues to have occasional nausea and indigestion. She otherwise feels well.  She has chronic weakness and fatigue. She has no neurologic complaints.  She denies any fevers.  She has a fair appetite and denies weight loss.  She has no chest pain or shortness of breath. She denies any vomiting, constipation, or diarrhea.  She has no urinary complaints.  Patient offers no further specific complaints today.  REVIEW OF SYSTEMS:   Review of Systems  Constitutional: Positive for malaise/fatigue. Negative for fever and weight loss.  Respiratory: Negative.  Negative for cough and shortness of breath.   Cardiovascular: Negative.  Negative for chest pain and leg swelling.  Gastrointestinal: Positive for heartburn and nausea. Negative for abdominal pain, constipation, diarrhea and vomiting.  Genitourinary: Negative.  Negative for dysuria.  Musculoskeletal: Negative.  Negative for back pain.  Skin: Negative.  Negative for rash.  Neurological: Positive for weakness. Negative for dizziness, sensory change, focal weakness and headaches.  Psychiatric/Behavioral: Negative.  The patient is not nervous/anxious.     As per HPI. Otherwise, a complete review of systems is negative.  PAST MEDICAL HISTORY: Past Medical History:  Diagnosis Date  . Arthritis    left  knee  . Asthma   . Endometrial cancer ([place]) 11/04/14  . PONV (postoperative nausea and vomiting)     PAST SURGICAL HISTORY: Past Surgical History:  Procedure Laterality Date  . ABDOMINAL HYSTERECTOMY    . LAPAROSCOPIC BILATERAL SALPINGO OOPHERECTOMY Bilateral 11/04/2014   Procedure: LAPAROSCOPIC BILATERAL SALPINGO OOPHORECTOMY;  Surgeon: Mellody Drown, MD;  Location: [facility] ORS;  Service: Gynecology;  Laterality: Bilateral;  . LAPAROSCOPIC HYSTERECTOMY N/A 11/04/2014   Procedure: HYSTERECTOMY TOTAL LAPAROSCOPIC;  Surgeon: Mellody Drown, MD;  Location: [facility] ORS;  Service: Gynecology;  Laterality: N/A;  . removed mass from thyriod      FAMILY HISTORY: Family History  Problem Relation Age of Onset  . Breast cancer Sister 92  . Asthma Mother   . Coronary artery disease Mother   . Diabetes Brother   . Diabetes Sister   . Diabetes Sister   . Diabetes Brother     ADVANCED DIRECTIVES (Y/N):  N  HEALTH MAINTENANCE: Social History   Tobacco Use  . Smoking status: Former Smoker    Packs/day: 0.25    Years: 8.00    Pack years: 2.00    Types: Cigarettes    Last attempt to quit: 06/29/1990    Years since quitting: 28.0  . Smokeless tobacco: Never Used  . Tobacco comment: Smoking History Cigarettes 1 cig per day; quit 22 years ago; smoked for 8 years  Substance Use Topics  . Alcohol use: No    Alcohol/week: 0.0 standard drinks    Comment: occasional   . Drug use: No     Colonoscopy:  PAP:  Bone density:  Lipid panel:  No Known Allergies  Current Outpatient Medications  Medication Sig Dispense Refill  . acetaminophen (  TYLENOL) 500 MG tablet Take 500 mg by mouth every 6 (six) hours as needed.    Marland Kitchen albuterol (PROVENTIL HFA;VENTOLIN HFA) 108 (90 BASE) MCG/ACT inhaler Inhale 2 puffs into the lungs every 6 (six) hours as needed for wheezing or shortness of breath.    . exemestane (AROMASIN) 25 MG tablet Take 1 tablet (25 mg total) by mouth daily after breakfast. 30 tablet 3  .  fluticasone (FLOVENT HFA) 220 MCG/ACT inhaler Take by mouth.    . gabapentin (NEURONTIN) 300 MG capsule Take 1 capsule (300 mg total) by mouth 2 (two) times daily. Neuropathic pain 30 capsule 0  . ondansetron (ZOFRAN) 8 MG tablet Take 1 tablet (8 mg total) by mouth 2 (two) times daily as needed for refractory nausea / vomiting. 30 tablet 2  . pantoprazole (PROTONIX) 40 MG tablet Take 1 tablet (40 mg total) by mouth daily. For acid reflux 90 tablet 3  . polyethylene glycol (MIRALAX) packet Take 17 g by mouth daily. 14 each 0  . prochlorperazine (COMPAZINE) 10 MG tablet Take 1 tablet (10 mg total) by mouth every 6 (six) hours as needed (Nausea or vomiting). 60 tablet 2  . senna-docusate (SENNA S) 8.6-50 MG tablet Take 1 tablet by mouth daily. 30 tablet 0  . traMADol (ULTRAM) 50 MG tablet Take 1 tablet (50 mg total) by mouth every 6 (six) hours as needed for moderate pain or severe pain. 5 tablet 0  . ALPRAZolam (XANAX) 0.25 MG tablet Take 1 tablet (0.25 mg total) by mouth 2 (two) times daily as needed for anxiety. 30 tablet 0   No current facility-administered medications for this visit.     OBJECTIVE: Vitals:   07/24/18 0905 07/24/18 0907  BP:  112/79  Pulse:  (!) 111  Temp: 98 F (36.7 C)      Body mass index is 31.97 kg/m.    ECOG FS:0 - Asymptomatic  General: Well-developed, well-nourished, no acute distress. Eyes: Pink conjunctiva, anicteric sclera. HEENT: Normocephalic, moist mucous membranes. Lungs: Clear to auscultation bilaterally. Heart: Regular rate and rhythm. No rubs, murmurs, or gallops. Abdomen: Mildly distended, mild left-sided tenderness on palpation. Musculoskeletal: No edema, cyanosis, or clubbing. Neuro: Alert, answering all questions appropriately. Cranial nerves grossly intact. Skin: No rashes or petechiae noted. Psych: Normal affect.  LAB RESULTS:  Lab Results  Component Value Date   NA 134 (L) 07/24/2018   K 3.6 07/24/2018   CL 101 07/24/2018   CO2 23  07/24/2018   GLUCOSE 142 (H) 07/24/2018   BUN 7 (L) 07/24/2018   CREATININE 0.46 07/24/2018   CALCIUM 8.5 (L) 07/24/2018   PROT 7.9 07/24/2018   ALBUMIN 3.0 (L) 07/24/2018   AST 16 07/24/2018   ALT 12 07/24/2018   ALKPHOS 63 07/24/2018   BILITOT 0.6 07/24/2018   GFRNONAA >60 07/24/2018   GFRAA >60 07/24/2018    Lab Results  Component Value Date   WBC 4.5 07/24/2018   NEUTROABS 3.1 07/24/2018   HGB 9.0 (L) 07/24/2018   HCT 29.7 (L) 07/24/2018   MCV 79.0 (L) 07/24/2018   PLT 368 07/24/2018     STUDIES: US Paracentesis  Result Date: 07/23/2018 CLINICAL DATA:  Ovarian and endometrial carcinoma. Abdominal ascites. EXAM: ULTRASOUND GUIDED PARACENTESIS TECHNIQUE: The procedure, risks (including but not limited to bleeding, infection, organ damage ), benefits, and alternatives were explained to the patient with the aid of interpreter. Questions regarding the procedure were encouraged and answered. The patient understands and consents to the procedure. Survey ultrasound of  the abdomen was performed and an appropriate skin entry site in the right lateral abdomen was selected. Skin site was marked, prepped with chlorhexadine, draped in usual sterile fashion, and infiltrated locally with 1% lidocaine. A 80F multisidehole Yueh sheath needle was advanced into the peritoneal space under real-time ultrasound guidance, until fluid could be aspirated. The sheath was advanced and the needle removed. 410 mL of yellow ascites were aspirated. The patient tolerated the procedure well. COMPLICATIONS: COMPLICATIONS none IMPRESSION: Technically successful ultrasound guided paracentesis, removing 410 mL yellow ascites, sent for the requested laboratory studies. Electronically Signed   By: Lucrezia Europe M.D.   On: 07/23/2018 14:21    ASSESSMENT: Progressive low-grade ovarian malignancy. PLAN:   1.  Progressive low-grade ovarian malignancy: CT scan results from June 06, 2018 reviewed independently with clear  progression of disease.  Patient CA-125 is trending down and is now 95.6.  Previously, patient underwent optimal surgical debulking and then completed 3 cycles of adjuvant carboplatinum and Taxol on February 26, 2018.  Patient was initially placed on maintenance letrozole, but could not tolerate treatment and this was switched to Aromasin. Hospice and end-of-life care were previously discussed, but patient declined and wished to additional treatment.  Proceed with cycle 3 of carboplatin and Taxol today.  Return to clinic in 3 weeks for further evaluation and consideration of cycle 4.     2.  History of endometrial cancer: Patient had total hysterectomy.  Imaging as above. 3.  Anemia: Patient's hemoglobin is decreased, but stable at 9.0.  She received 1 dose of 510 mg IV Feraheme on July 03, 2018. 4.  Leukopenia: Resolved.   5.  Anxiety: Continue Xanax as needed.  Patient was given a refill today. 6.  Indigestion: Continue Protonix as prescribed.  The entire visit was done in the presence of an interpreter.  Patient expressed understanding and was in agreement with this plan. She also understands that She can call clinic at any time with any questions, concerns, or complaints.   Cancer Staging Borderline epithelial neoplasm of ovary Staging form: Ovary, Fallopian Tube, and Primary Peritoneal Carcinoma, AJCC 8th Edition - Clinical stage from 01/11/2018: FIGO Stage III (cT3, cN0, cM0) - Signed by Lloyd Huger, MD on 01/11/2018  Endometrial cancer [facility]) Staging form: Corpus Uteri - Carcinoma, AJCC 7th Edition - Clinical: Stage I (T1, N0, M0) - Unsigned  Ovarian carcinoma, unspecified laterality ([place]) Staging form: Ovary, AJCC 7th Edition - Clinical: Stage Unknown (T1c, NX, M0) - Signed by Forest Gleason, MD on 11/27/2014   Lloyd Huger, MD   07/25/2018 6:31 AM

## 2018-07-22 ENCOUNTER — Inpatient Hospital Stay: Payer: Self-pay

## 2018-07-22 ENCOUNTER — Encounter: Payer: Self-pay | Admitting: Oncology

## 2018-07-22 ENCOUNTER — Telehealth: Payer: Self-pay | Admitting: *Deleted

## 2018-07-22 ENCOUNTER — Inpatient Hospital Stay (HOSPITAL_BASED_OUTPATIENT_CLINIC_OR_DEPARTMENT_OTHER): Payer: Self-pay | Admitting: Oncology

## 2018-07-22 VITALS — BP 123/85 | HR 119 | Temp 98.7°F | Resp 18 | Wt 169.0 lb

## 2018-07-22 DIAGNOSIS — R531 Weakness: Secondary | ICD-10-CM

## 2018-07-22 DIAGNOSIS — R971 Elevated cancer antigen 125 [CA 125]: Secondary | ICD-10-CM

## 2018-07-22 DIAGNOSIS — C778 Secondary and unspecified malignant neoplasm of lymph nodes of multiple regions: Secondary | ICD-10-CM

## 2018-07-22 DIAGNOSIS — G629 Polyneuropathy, unspecified: Secondary | ICD-10-CM

## 2018-07-22 DIAGNOSIS — Z87891 Personal history of nicotine dependence: Secondary | ICD-10-CM

## 2018-07-22 DIAGNOSIS — J45909 Unspecified asthma, uncomplicated: Secondary | ICD-10-CM

## 2018-07-22 DIAGNOSIS — Z5111 Encounter for antineoplastic chemotherapy: Secondary | ICD-10-CM

## 2018-07-22 DIAGNOSIS — C786 Secondary malignant neoplasm of retroperitoneum and peritoneum: Secondary | ICD-10-CM

## 2018-07-22 DIAGNOSIS — E860 Dehydration: Secondary | ICD-10-CM

## 2018-07-22 DIAGNOSIS — Z79899 Other long term (current) drug therapy: Secondary | ICD-10-CM

## 2018-07-22 DIAGNOSIS — C569 Malignant neoplasm of unspecified ovary: Secondary | ICD-10-CM

## 2018-07-22 DIAGNOSIS — R1084 Generalized abdominal pain: Secondary | ICD-10-CM

## 2018-07-22 DIAGNOSIS — Z90710 Acquired absence of both cervix and uterus: Secondary | ICD-10-CM

## 2018-07-22 DIAGNOSIS — R109 Unspecified abdominal pain: Secondary | ICD-10-CM

## 2018-07-22 DIAGNOSIS — Z5189 Encounter for other specified aftercare: Secondary | ICD-10-CM

## 2018-07-22 DIAGNOSIS — C541 Malignant neoplasm of endometrium: Secondary | ICD-10-CM

## 2018-07-22 DIAGNOSIS — Z90722 Acquired absence of ovaries, bilateral: Secondary | ICD-10-CM

## 2018-07-22 DIAGNOSIS — M129 Arthropathy, unspecified: Secondary | ICD-10-CM

## 2018-07-22 LAB — CBC WITH DIFFERENTIAL/PLATELET
Abs Immature Granulocytes: 0.01 10*3/uL (ref 0.00–0.07)
Basophils Absolute: 0 10*3/uL (ref 0.0–0.1)
Basophils Relative: 0 %
Eosinophils Absolute: 0 10*3/uL (ref 0.0–0.5)
Eosinophils Relative: 1 %
HCT: 31.4 % — ABNORMAL LOW (ref 36.0–46.0)
Hemoglobin: 9.4 g/dL — ABNORMAL LOW (ref 12.0–15.0)
Immature Granulocytes: 0 %
LYMPHS ABS: 0.9 10*3/uL (ref 0.7–4.0)
Lymphocytes Relative: 20 %
MCH: 23.7 pg — ABNORMAL LOW (ref 26.0–34.0)
MCHC: 29.9 g/dL — ABNORMAL LOW (ref 30.0–36.0)
MCV: 79.3 fL — AB (ref 80.0–100.0)
MONOS PCT: 11 %
Monocytes Absolute: 0.5 10*3/uL (ref 0.1–1.0)
Neutro Abs: 3.1 10*3/uL (ref 1.7–7.7)
Neutrophils Relative %: 68 %
Platelets: 358 10*3/uL (ref 150–400)
RBC: 3.96 MIL/uL (ref 3.87–5.11)
RDW: 22.2 % — ABNORMAL HIGH (ref 11.5–15.5)
WBC: 4.6 10*3/uL (ref 4.0–10.5)
nRBC: 0 % (ref 0.0–0.2)

## 2018-07-22 LAB — COMPREHENSIVE METABOLIC PANEL
ALT: 11 U/L (ref 0–44)
AST: 14 U/L — ABNORMAL LOW (ref 15–41)
Albumin: 3.1 g/dL — ABNORMAL LOW (ref 3.5–5.0)
Alkaline Phosphatase: 56 U/L (ref 38–126)
Anion gap: 10 (ref 5–15)
BUN: 11 mg/dL (ref 8–23)
CO2: 22 mmol/L (ref 22–32)
Calcium: 8.6 mg/dL — ABNORMAL LOW (ref 8.9–10.3)
Chloride: 101 mmol/L (ref 98–111)
Creatinine, Ser: 0.48 mg/dL (ref 0.44–1.00)
GFR calc Af Amer: >60 mL/min (ref >60)
GFR calc non Af Amer: >60 mL/min (ref >60)
Glucose, Bld: 130 mg/dL — ABNORMAL HIGH (ref 70–99)
Potassium: 3.7 mmol/L (ref 3.5–5.1)
Sodium: 133 mmol/L — ABNORMAL LOW (ref 135–145)
Total Bilirubin: 0.5 mg/dL (ref 0.3–1.2)
Total Protein: 8.2 g/dL — ABNORMAL HIGH (ref 6.5–8.1)

## 2018-07-22 MED ORDER — KETOROLAC TROMETHAMINE 15 MG/ML IJ SOLN
INTRAMUSCULAR | Status: AC
  Filled 2018-07-22: qty 2

## 2018-07-22 MED ORDER — KETOROLAC TROMETHAMINE 15 MG/ML IJ SOLN
30.0000 mg | Freq: Once | INTRAMUSCULAR | Status: AC
  Administered 2018-07-22: 30 mg via INTRAVENOUS

## 2018-07-22 MED ORDER — SODIUM CHLORIDE 0.9 % IV SOLN
Freq: Once | INTRAVENOUS | Status: AC
  Administered 2018-07-22: 15:00:00 via INTRAVENOUS
  Filled 2018-07-22: qty 250

## 2018-07-22 NOTE — Progress Notes (Signed)
Symptom Management Consult note [facility]  [phone] [phone]  Patient Care Team: Letta Median, MD as PCP - General (Family Medicine) Clent Jacks, RN as Registered Nurse Rico Junker, RN as Registered Nurse Theodore Demark, RN as Registered Nurse   Name of the patient: Elizabeth Davenport  [id number]  [birth year]   Date of visit: 07/22/2018   Diagnosis-recurrent low-grade serous ovarian cancer  Chief complaint/ Reason for visit-abdominal pain  Heme/Onc history: S/p 5 cycles dose reduced carbo/Taxol.  Last given on 07/03/2018.  Interval history-patient was last seen by primary oncologist Dr. Grayland Ormond on 07/03/2018 prior to dose reduced [place] Taxol cycle 5.  That visit continued to have a decreased performance status, increased nausea and poor appetite.  He complained of chronic fatigue and weakness denied any further abdominal pain and or bloating.  Labs were adequate for treatment and they proceeded with cycle 5 carbo/Taxol.  She was scheduled to return to clinic in 3 weeks for further evaluation and consideration of cycle 6.  In the interim she was seen by Beckey Rutter, NP with gynecology oncology.  At that visit, she reported fatigue, weakness and peripheral neuropathy.  On gabapentin.  They discussed trametinib as additional therapeutic options should she progress on carbo/Taxol.  Scheduled to follow-up with gynecology oncology in 3 to 4 months.  Imaging from 06/06/2018 was consistent with progression.  Ca1 25 is elevated.  Reinitiated carbo/Taxol on 06/14/2018.  Good response to initial cycles of carbo/Taxol.  She is a Jehovah witness and does not accept blood products.  Agreeable to iron.   She has been seen several times in [facility] for complaints of abdominal bloating (requiring paracentesis), peripheral neuropathy, electrolyte imbalances and generalized weakness.  Today, patient presents to [facility] with complaints of abdominal  pain. The pain is described as aching, pressure-like and shooting, and is 8/10 in intensity. Pain is located in the epigastric, LLQ with radiation to perineum. Onset was 3 days ago. Symptoms have been gradually worsening since. Aggravating factors: none.  Alleviating factors: none. Associated symptoms: belching and GERD symptoms. The patient denies chills, diarrhea, fever, headache and vomiting.  ECOG FS:2 - Symptomatic, <50% confined to bed  Review of systems- Review of Systems  Constitutional: Positive for malaise/fatigue. Negative for chills, fever and weight loss.  HENT: Negative for congestion, ear pain and tinnitus.   Eyes: Negative.  Negative for blurred vision and double vision.  Respiratory: Negative.  Negative for cough, sputum production and shortness of breath.   Cardiovascular: Negative.  Negative for chest pain, palpitations and leg swelling.  Gastrointestinal: Positive for abdominal pain, constipation and nausea. Negative for diarrhea and vomiting.  Genitourinary: Negative for dysuria, frequency and urgency.  Musculoskeletal: Negative for back pain and falls.  Skin: Negative.  Negative for rash.  Neurological: Positive for weakness. Negative for headaches.  Endo/Heme/Allergies: Negative.  Does not bruise/bleed easily.  Psychiatric/Behavioral: Negative.  Negative for depression. The patient is not nervous/anxious and does not have insomnia.      Current treatment- S/p cycle 5 dose reduced carbo/Taxol.  Last given on 07/03/2018.  No Known Allergies   Past Medical History:  Diagnosis Date  . Arthritis    left knee  . Asthma   . Endometrial cancer ([place]) 11/04/14  . PONV (postoperative nausea and vomiting)      Past Surgical History:  Procedure Laterality Date  . ABDOMINAL HYSTERECTOMY    . LAPAROSCOPIC BILATERAL SALPINGO OOPHERECTOMY Bilateral 11/04/2014   Procedure: LAPAROSCOPIC  BILATERAL SALPINGO OOPHORECTOMY;  Surgeon: Mellody Drown, MD;  Location: [facility] ORS;   Service: Gynecology;  Laterality: Bilateral;  . LAPAROSCOPIC HYSTERECTOMY N/A 11/04/2014   Procedure: HYSTERECTOMY TOTAL LAPAROSCOPIC;  Surgeon: Mellody Drown, MD;  Location: [facility] ORS;  Service: Gynecology;  Laterality: N/A;  . removed mass from thyriod      Social History   Socioeconomic History  . Marital status: Married    Spouse name: Not on file  . Number of children: Not on file  . Years of education: Not on file  . Highest education level: Not on file  Occupational History  . Not on file  Social Needs  . Financial resource strain: Not on file  . Food insecurity:    Worry: Not on file    Inability: Not on file  . Transportation needs:    Medical: Not on file    Non-medical: Not on file  Tobacco Use  . Smoking status: Former Smoker    Packs/day: 0.25    Years: 8.00    Pack years: 2.00    Types: Cigarettes    Last attempt to quit: 06/29/1990    Years since quitting: 28.0  . Smokeless tobacco: Never Used  . Tobacco comment: Smoking History Cigarettes 1 cig per day; quit 22 years ago; smoked for 8 years  Substance and Sexual Activity  . Alcohol use: No    Alcohol/week: 0.0 standard drinks    Comment: occasional   . Drug use: No  . Sexual activity: Yes    Partners: Male  Lifestyle  . Physical activity:    Days per week: Not on file    Minutes per session: Not on file  . Stress: Not on file  Relationships  . Social connections:    Talks on phone: Not on file    Gets together: Not on file    Attends religious service: Not on file    Active member of club or organization: Not on file    Attends meetings of clubs or organizations: Not on file    Relationship status: Not on file  . Intimate partner violence:    Fear of current or ex partner: Not on file    Emotionally abused: Not on file    Physically abused: Not on file    Forced sexual activity: Not on file  Other Topics Concern  . Not on file  Social History Narrative  . Not on file    Family History    Problem Relation Age of Onset  . Breast cancer Sister 36  . Asthma Mother   . Coronary artery disease Mother   . Diabetes Brother   . Diabetes Sister   . Diabetes Sister   . Diabetes Brother      Current Outpatient Medications:  .  acetaminophen (TYLENOL) 500 MG tablet, Take 500 mg by mouth every 6 (six) hours as needed., Disp: , Rfl:  .  albuterol (PROVENTIL HFA;VENTOLIN HFA) 108 (90 BASE) MCG/ACT inhaler, Inhale 2 puffs into the lungs every 6 (six) hours as needed for wheezing or shortness of breath., Disp: , Rfl:  .  fluticasone (FLOVENT HFA) 220 MCG/ACT inhaler, Take by mouth., Disp: , Rfl:  .  gabapentin (NEURONTIN) 300 MG capsule, Take 1 capsule (300 mg total) by mouth 2 (two) times daily. Neuropathic pain, Disp: 30 capsule, Rfl: 0 .  ondansetron (ZOFRAN) 8 MG tablet, Take 1 tablet (8 mg total) by mouth 2 (two) times daily as needed for refractory nausea / vomiting., Disp: 30  tablet, Rfl: 2 .  pantoprazole (PROTONIX) 40 MG tablet, Take 1 tablet (40 mg total) by mouth daily. For acid reflux, Disp: 90 tablet, Rfl: 3 .  polyethylene glycol (MIRALAX) packet, Take 17 g by mouth daily., Disp: 14 each, Rfl: 0 .  prochlorperazine (COMPAZINE) 10 MG tablet, Take 1 tablet (10 mg total) by mouth every 6 (six) hours as needed (Nausea or vomiting)., Disp: 60 tablet, Rfl: 2 .  senna-docusate (SENNA S) 8.6-50 MG tablet, Take 1 tablet by mouth daily., Disp: 30 tablet, Rfl: 0 .  exemestane (AROMASIN) 25 MG tablet, Take 1 tablet (25 mg total) by mouth daily after breakfast. (Patient not taking: Reported on 06/21/2018), Disp: 30 tablet, Rfl: 3 .  traMADol (ULTRAM) 50 MG tablet, Take 1 tablet (50 mg total) by mouth every 6 (six) hours as needed for moderate pain or severe pain. (Patient not taking: Reported on 07/22/2018), Disp: 5 tablet, Rfl: 0  Physical exam:  Vitals:   07/22/18 1422  BP: 123/85  Pulse: (!) 119  Resp: 18  Temp: 98.7 F (37.1 C)  TempSrc: Tympanic  SpO2: 98%  Weight: 169 lb  (76.7 kg)   Physical Exam Constitutional:      Appearance: Normal appearance. She is obese. She is ill-appearing.  HENT:     Head: Normocephalic and atraumatic.  Eyes:     Pupils: Pupils are equal, round, and reactive to light.  Neck:     Musculoskeletal: Normal range of motion.  Cardiovascular:     Rate and Rhythm: Normal rate and regular rhythm.     Heart sounds: Normal heart sounds. No murmur.  Pulmonary:     Effort: Pulmonary effort is normal.     Breath sounds: Normal breath sounds. No wheezing.  Abdominal:     General: Bowel sounds are decreased. There is no distension.     Palpations: Abdomen is soft.     Tenderness: There is abdominal tenderness in the epigastric area, suprapubic area and left lower quadrant.  Musculoskeletal: Normal range of motion.  Skin:    General: Skin is warm and dry.     Coloration: Skin is pale.     Findings: No rash.  Neurological:     Mental Status: She is alert and oriented to person, place, and time.  Psychiatric:        Judgment: Judgment normal.      CMP Latest Ref Rng & Units 07/22/2018  Glucose 70 - 99 mg/dL 130(H)  BUN 8 - 23 mg/dL 11  Creatinine 0.44 - 1.00 mg/dL 0.48  Sodium 135 - 145 mmol/L 133(L)  Potassium 3.5 - 5.1 mmol/L 3.7  Chloride 98 - 111 mmol/L 101  CO2 22 - 32 mmol/L 22  Calcium 8.9 - 10.3 mg/dL 8.6(L)  Total Protein 6.5 - 8.1 g/dL 8.2(H)  Total Bilirubin 0.3 - 1.2 mg/dL 0.5  Alkaline Phos 38 - 126 U/L 56  AST 15 - 41 U/L 14(L)  ALT 0 - 44 U/L 11   CBC Latest Ref Rng & Units 07/22/2018  WBC 4.0 - 10.5 K/uL 4.6  Hemoglobin 12.0 - 15.0 g/dL 9.4(L)  Hematocrit 36.0 - 46.0 % 31.4(L)  Platelets 150 - 400 K/uL 358    No images are attached to the encounter.  No results found.   Assessment and plan- Patient is a 63 y.o. female who presents to [facility] clinic for evaluation of persistent abdominal bloating and tenderness.  Present for 3 days.  Stage Ia grade 1 endometrial cancer: S/p 5 cycles of  carbotaxol.  Last  given on 07/03/2018. cycle 5 dose reduced d/t intolerance; decreasing performance status.  Diagnosed with low-grade serous cancer s/p laparoscopic omentectomy on 12/13/2017 with optimal debulking surgery.  Had 3 cycles of adjuvant carbo/Taxol completing on 02/26/2018.  Started on letrozole in October 2019 and switched to anastrozole due to progressive bony and low back pain.  Presented to [facility] on 06/03/2018 for abdominal bloating and distention. Had ultrasound guided paracentesis where 1900 mL's of fluid was removed positive for malignancy.  T abdomen/pelvis imaging completed on 06/06/2018 revealing progression of disease with a rising Ca 125.   Recurrent abdominal bloating/pain: On assessment, patient's abdomen is distended.  Reproducible 8/10 pain to left lower upper and epigastric area.  Hypoactive bowel sounds.  No organomegaly.  Has had prior paracentesis where 1.9 L of fluid were removed; positive for malignant cells (06/06/2018).  Patient refuses to take tramadol for pain control due to possible dependency.  Attempted to educate patient and family members on appropriate use of narcotic pain medication.  Spoke of other options that include Tylenol and/or ibuprofen as needed.  Patient would like to try Tylenol.  Plan: Stat ultrasound of abdomen with possible paracentesis.  Scheduled for 07/23/2018 at 1 PM. Repeat Ca 125.  95.6-improving Stat labs; mild hyponatremia (133), hypoalbuminemia (3.1), anemia (9.4) Give 1 L NaCl. Give 30 mg Toradol in clinic. RTC as scheduled on 07/24/2018 labs MD assessment and consideration of cycle 6 dose reduced carbo/Taxol.     Visit Diagnosis 1. Ovarian carcinoma, unspecified laterality ([place])   2. Weakness   3. Dehydration     Patient expressed understanding and was in agreement with this plan. She also understands that She can call clinic at any time with any questions, concerns, or complaints.   Greater than 50% was spent in counseling and coordination of care with  this patient including but not limited to discussion of the relevant topics above (See A&P) including, but not limited to diagnosis and management of acute and chronic medical conditions.   Thank you for allowing me to participate in the care of this very pleasant patient.    Jacquelin Hawking, NP [place] [facility] Cell - [phone] Pager- [phone] 07/23/2018 12:52 PM

## 2018-07-22 NOTE — Patient Instructions (Addendum)
Today you received IV fluids and 30 mg Torodol for abdominal pain.   We will get you set up for an ultrasound of your abdomen soon as possible.  We will let you know when your appointment is.  We will draw your Ca125 (tumor marker) today.  I will call you with your her last Ca1 25 was on 06/03/2018 134.0.  Normal range is > 38.   Please call us if you have any other concerns.  Triage line is [id number] option #3.  Faythe Casa, NP 07/22/2018 4:06 PM    Ketorolac injection What is this medicine? KETOROLAC (kee toe ROLE ak) is a non-steroidal anti-inflammatory drug (NSAID). It is used to treat moderate to severe pain for up to 5 days. It is commonly used after surgery. This medicine should not be used for more than 5 days. This medicine may be used for other purposes; ask your health care provider or pharmacist if you have questions. COMMON BRAND NAME(S): Toradol What should I tell my health care provider before I take this medicine? They need to know if you have any of these conditions: -asthma, especially aspirin-sensitive asthma -bleeding problems -coronary artery bypass graft (CABG) surgery within the past 2 weeks -kidney disease -stomach bleed, ulcer, or other problem -taking aspirin, other NSAID, or probenecid -an unusual or allergic reaction to ketorolac, tromethamine, aspirin, other NSAIDs, other medicines, foods, dyes or preservatives -pregnant or trying to get pregnant -breast-feeding How should I use this medicine? This medicine is for injection into a muscle or into a vein. It is given by a health care professional in a hospital or clinic setting. Talk to your pediatrician regarding the use of this medicine in children. Special care may be needed. Patients over 60 years old may have a stronger reaction and need a smaller dose. Overdosage: If you think you have taken too much of this medicine contact a poison control center or emergency room at once. NOTE: This medicine is  only for you. Do not share this medicine with others. What if I miss a dose? This does not apply. What may interact with this medicine? Do not take this medicine with any of the following medications: -aspirin and aspirin-like medicines -cidofovir -methotrexate -NSAIDs, medicines for pain and inflammation, like ibuprofen or naproxen -pentoxifylline -probenecid This medicine may also interact with the following medications: -alcohol -alendronate -alprazolam -carbamazepine -diuretics -flavocoxid -fluoxetine -ginkgo -lithium -medicines for blood pressure like enalapril -medicines that affect platelets like pentoxifylline -medicines that treat or prevent blood clots like heparin, warfarin -muscle relaxants -pemetrexed -phenytoin -thiothixene This list may not describe all possible interactions. Give your health care provider a list of all the medicines, herbs, non-prescription drugs, or dietary supplements you use. Also tell them if you smoke, drink alcohol, or use illegal drugs. Some items may interact with your medicine. What should I watch for while using this medicine? Tell your doctor or healthcare professional if your symptoms do not start to get better or if they get worse. This medicine does not prevent heart attack or stroke. In fact, this medicine may increase the chance of a heart attack or stroke. The chance may increase with longer use of this medicine and in people who have heart disease. If you take aspirin to prevent heart attack or stroke, talk with your doctor or health care professional. Do not take medicines such as ibuprofen and naproxen with this medicine. Side effects such as stomach upset, nausea, or ulcers may be more likely to occur. Many  medicines available without a prescription should not be taken with this medicine. This medicine can cause ulcers and bleeding in the stomach and intestines at any time during treatment. Do not smoke cigarettes or drink  alcohol. These increase irritation to your stomach and can make it more susceptible to damage from this medicine. Ulcers and bleeding can happen without warning symptoms and can cause death. This medicine can cause you to bleed more easily. Try to avoid damage to your teeth and gums when you brush or floss your teeth. What side effects may I notice from receiving this medicine? Side effects that you should report to your doctor or health care professional as soon as possible: -allergic reactions like skin rash, itching or hives, swelling of the face, lips, or tongue -breathing problems -high blood pressure -nausea, vomiting -redness, blistering, peeling or loosening of the skin, including inside the mouth -severe stomach pain -signs and symptoms of bleeding such as bloody or black, tarry stools; red or dark-brown urine; spitting up blood or brown material that looks like coffee grounds; red spots on the skin; unusual bruising or bleeding from the eye, gums, or nose -signs and symptoms of a blood clot changes in vision; chest pain; severe, sudden headache; trouble speaking; sudden numbness or weakness of the face, arm, or leg -trouble passing urine or change in the amount of urine -unexplained weight gain or swelling -unusually weak or tired -yellowing of eyes or skin Side effects that usually do not require medical attention (report to your doctor or health care professional if they continue or are bothersome): -diarrhea -dizziness -headache -heartburn This list may not describe all possible side effects. Call your doctor for medical advice about side effects. You may report side effects to FDA at 1-800-FDA-1088. Where should I keep my medicine? This drug is given in a hospital or clinic and will not be stored at home. NOTE: This sheet is a summary. It may not cover all possible information. If you have questions about this medicine, talk to your doctor, pharmacist, or health care  provider.  2019 Elsevier/Gold Standard (2017-02-14 15:19:19)   Paracentesis Paracentesis La paracentesis es un procedimiento que se utiliza para retirar el exceso de lquido (ascitis) del vientre (abdomen). La ascitis puede ser el resultado de ciertos trastornos, como una infeccin, inflamacin, lesin abdominal, insuficiencia cardaca, cicatrizacin crnica del hgado (cirrosis) o cncer. La ascitis se retira usando una aguja que se inserta en el abdomen a travs de la piel y los tejidos. Este procedimiento se Optometrist para:  Determinar la causa de la ascitis.  Aliviar los sntomas provocados por la ascitis, como dolor y falta de [place].  Ver si hay hemorragia luego de una lesin abdominal. Informe al mdico acerca de lo siguiente:  Cualquier alergia que tenga.  Todos los [employer], incluidos vitaminas, hierbas, gotas oftlmicas, cremas y medicamentos de venta libre.  Cualquier problema que usted o sus familiares hayan tenido con anestsicos.  Cualquier enfermedad de la sangre que tenga.  Cirugas a las que se someti.  Cualquier afeccin mdica que tenga.  Si est embarazada o podra estarlo. Cules son los riesgos? En general, se trata de un procedimiento seguro. Sin embargo, pueden ocurrir complicaciones, por ejemplo:  Infeccin.  Hemorragia.  Lesiones en un rgano abdominal, como el intestino (intestino grueso), el hgado, el bazo o la vejiga.  Presin arterial baja (hipotensin).  Diseminacin del cncer, si hay clulas cancerosas en el lquido abdominal.  El estado mental cambia en las personas que  tienen enfermedad heptica. Las Doctor, hospital equilibrio de los lquidos y los minerales (Brewing technologist) del organismo provocaran estos cambios. Qu ocurre antes del procedimiento?  Consulte al mdico si debe hacer o no lo siguiente: ? Cambiar o suspender los medicamentos que toma habitualmente. Esto es muy importante si toma medicamentos  para la diabetes o anticoagulantes. ? Tomar medicamentos como aspirina e ibuprofeno. Estos medicamentos pueden tener un efecto anticoagulante en la [place]. No tome estos medicamentos antes del procedimiento si el mdico le indica que no lo haga.  Le tomarn Truddie Coco de sangre para determinar el tiempo de coagulacin.  Se le pedir que orine. Qu ocurre durante el procedimiento?   Se le puede pedir que se recueste sobre la espalda con la cabeza levantada (elevada).  Para disminuir el riesgo de contraer una infeccin: ? El equipo mdico se lavar o se Transport planner. ? Le lavarn la piel con jabn.  Le administrarn un medicamento para adormecer la zona (anestesia local).  Se le har una puncin en la piel del abdomen con una aguja o escalpelo.  Se insertar un tubo de drenaje a travs del lugar de la puncin. Se drenar el lquido a travs del tubo hacia un recipiente.  Luego de que se haya extrado suficiente lquido, se quitar el tubo.  Se enviar una muestra del lquido para ser examinado.  Se colocar una venda (vendaje) sobre TEFL teacher de la puncin. Este procedimiento puede variar segn el mdico y el hospital. Sander Nephew ocurre despus del procedimiento?  Es su responsabilidad retirar el resultado del [place]. Consulte a su mdico o en el departamento donde se realice el estudio cundo estarn [employer]. Esta informacin no tiene Marine scientist el consejo del mdico. Asegrese de hacerle al mdico cualquier pregunta que tenga. Document Released: 09/28/2008 Document Revised: 04/11/2017 Document Reviewed: 08/25/2014 Elsevier Interactive Patient Education  2019 Elsevier Inc. Tramadol extended release tablets or capsules What is this medicine? TRAMADOL (TRA ma dole) is a pain reliever. It is used to treat moderate to severe pain in adults. This medicine may be used for other purposes; ask your health care provider or pharmacist if you have  questions. COMMON BRAND NAME(S): ConZip, [place], Ultram ER What should I tell my health care provider before I take this medicine? They need to know if you have any of these conditions: -brain tumor -drink more than 3 alcohol-containing drinks per day -drug abuse or addiction -head injury -kidney disease or problems going to the bathroom -liver disease -lung disease, asthma, or breathing problems -seizures or epilepsy -an unusual or allergic reaction to tramadol, codeine, other medicines, foods, dyes, or preservatives -pregnant or trying to get pregnant -breast-feeding How should I use this medicine? Take this medicine by mouth with a glass of water. Follow the directions on the prescription label. Do not cut, crush or chew this medicine. Take this medicine the same way each day, either with or without food. If it upsets your stomach, take it with food. Take your medicine at regular intervals. Do not take it more often than directed. Do not stop taking except on your doctor's advice. A special MedGuide will be given to you by the pharmacist with each prescription and refill. Be sure to read this information carefully each time. Talk to your pediatrician regarding the use of this medicine in children. Special care may be needed. This medicine is not for use in children less than 87 years of age. Do not give this medicine to a  child younger than 43 years of age after surgery to remove the tonsils and/or adenoids. Overdosage: If you think you have taken too much of this medicine contact a poison control center or emergency room at once. NOTE: This medicine is only for you. Do not share this medicine with others. What if I miss a dose? If you miss a dose, take it as soon as you can. If it is almost time for your next dose, take only that dose. Do not take double or extra doses. What may interact with this medicine? Do not take this medication with any of the following medicines: -MAOIs like  Carbex, Eldepryl, Marplan, Nardil, and Parnate This medicine may also interact with the following medications: -alcohol -antihistamines for allergy, cough and cold -certain medicines for anxiety or sleep -certain medicines for depression like amitriptyline, fluoxetine, sertraline -certain medicines for migraine headache like almotriptan, eletriptan, frovatriptan, naratriptan, rizatriptan, sumatriptan, zolmitriptan -certain medicines for seizures like carbamazepine, oxcarbazepine, phenobarbital, primidone -certain medicines that treat or prevent blood clots like warfarin -digoxin -furazolidone -general anesthetics like halothane, isoflurane, methoxyflurane, propofol -linezolid -local anesthetics like lidocaine, pramoxine, tetracaine -medicines that relax muscles for surgery -other narcotic medicines for pain or cough -phenothiazines like chlorpromazine, mesoridazine, prochlorperazine, thioridazine -procarbazine This list may not describe all possible interactions. Give your health care provider a list of all the medicines, herbs, non-prescription drugs, or dietary supplements you use. Also tell them if you smoke, drink alcohol, or use illegal drugs. Some items may interact with your medicine. What should I watch for while using this medicine? Tell your doctor or health care professional if your pain does not go away, if it gets worse, or if you have new or a different type of pain. You may develop tolerance to the medicine. Tolerance means that you will need a higher dose of the medicine for pain relief. Tolerance is normal and is expected if you take this medicine for a long time. Do not suddenly stop taking your medicine because you may develop a severe reaction. Your body becomes used to the medicine. This does NOT mean you are addicted. Addiction is a behavior related to getting and using a drug for a non-medical reason. If you have pain, you have a medical reason to take pain medicine. Your  doctor will tell you how much medicine to take. If your doctor wants you to stop the medicine, the dose will be slowly lowered over time to avoid any side effects. There are different types of narcotic medicines (opiates). If you take more than one type at the same time or if you are taking another medicine that also causes drowsiness, you may have more side effects. Give your health care provider a list of all medicines you use. Your doctor will tell you how much medicine to take. Do not take more medicine than directed. Call emergency for help if you have problems breathing or unusual sleepiness. You may get drowsy or dizzy. Do not drive, use machinery, or do anything that needs mental alertness until you know how this medicine affects you. Do not stand or sit up quickly, especially if you are an older patient. This reduces the risk of dizzy or fainting spells. Alcohol can increase or decrease the effects of this medicine. Avoid alcoholic drinks. You may have constipation. Try to have a bowel movement at least every 2 to 3 days. If you do not have a bowel movement for 3 days, call your doctor or health care professional. Your mouth may  get dry. Chewing sugarless gum or sucking hard candy, and drinking plenty of water may help. Contact your doctor if the problem does not go away or is severe. What side effects may I notice from receiving this medicine? Side effects that you should report to your doctor or health care professional as soon as possible: -allergic reactions like skin rash, itching or hives, swelling of the face, lips, or tongue -breathing problems -confusion -seizures -signs and symptoms of low blood pressure like dizziness; feeling faint or lightheaded, falls; unusually weak or tired -trouble passing urine or change in the amount of urine Side effects that usually do not require medical attention (report to your doctor or health care professional if they continue or are  bothersome): -constipation -dry mouth -nausea, vomiting -tiredness This list may not describe all possible side effects. Call your doctor for medical advice about side effects. You may report side effects to FDA at 1-800-FDA-1088. Where should I keep my medicine? Keep out of the reach of children. This medicine may cause accidental overdose and death if it taken by other adults, children, or pets. Mix any unused medicine with a substance like cat litter or coffee grounds. Then throw the medicine away in a sealed container like a sealed bag or a coffee can with a lid. Do not use the medicine after the expiration date. Store at room temperature between 15 and 30 degrees C (59 and 86 degrees F). Keep container tightly closed. NOTE: This sheet is a summary. It may not cover all possible information. If you have questions about this medicine, talk to your doctor, pharmacist, or health care provider.  2019 Elsevier/Gold Standard (2016-07-26 13:18:26)

## 2018-07-22 NOTE — Telephone Encounter (Signed)
Daughter called reporting that patient has been in pain all weekend and is asking to be seen today Abdominal pain rated at 8/10. Please advise

## 2018-07-22 NOTE — Telephone Encounter (Signed)
Ppt accepted for 2PM lab, then see NP

## 2018-07-23 ENCOUNTER — Ambulatory Visit
Admission: RE | Admit: 2018-07-23 | Discharge: 2018-07-23 | Disposition: A | Discharge status: Home / Self Care | Payer: Self-pay | Source: Ambulatory Visit | Attending: Oncology | Admitting: Oncology

## 2018-07-23 DIAGNOSIS — C569 Malignant neoplasm of unspecified ovary: Secondary | ICD-10-CM | POA: Insufficient documentation

## 2018-07-23 LAB — CA 125: Cancer Antigen (CA) 125: 95.6 U/mL — ABNORMAL HIGH (ref 0.0–38.1)

## 2018-07-24 ENCOUNTER — Inpatient Hospital Stay: Payer: Self-pay

## 2018-07-24 ENCOUNTER — Other Ambulatory Visit: Payer: Self-pay

## 2018-07-24 ENCOUNTER — Inpatient Hospital Stay (HOSPITAL_BASED_OUTPATIENT_CLINIC_OR_DEPARTMENT_OTHER): Payer: Self-pay | Admitting: Oncology

## 2018-07-24 ENCOUNTER — Other Ambulatory Visit: Payer: Self-pay | Admitting: *Deleted

## 2018-07-24 VITALS — HR 105

## 2018-07-24 VITALS — BP 112/79 | HR 111 | Temp 98.0°F | Ht 61.0 in | Wt 169.2 lb

## 2018-07-24 DIAGNOSIS — G629 Polyneuropathy, unspecified: Secondary | ICD-10-CM

## 2018-07-24 DIAGNOSIS — Z90722 Acquired absence of ovaries, bilateral: Secondary | ICD-10-CM

## 2018-07-24 DIAGNOSIS — Z79899 Other long term (current) drug therapy: Secondary | ICD-10-CM

## 2018-07-24 DIAGNOSIS — C778 Secondary and unspecified malignant neoplasm of lymph nodes of multiple regions: Secondary | ICD-10-CM

## 2018-07-24 DIAGNOSIS — R531 Weakness: Secondary | ICD-10-CM

## 2018-07-24 DIAGNOSIS — F419 Anxiety disorder, unspecified: Secondary | ICD-10-CM

## 2018-07-24 DIAGNOSIS — C786 Secondary malignant neoplasm of retroperitoneum and peritoneum: Secondary | ICD-10-CM

## 2018-07-24 DIAGNOSIS — K30 Functional dyspepsia: Secondary | ICD-10-CM

## 2018-07-24 DIAGNOSIS — D3910 Neoplasm of uncertain behavior of unspecified ovary: Secondary | ICD-10-CM

## 2018-07-24 DIAGNOSIS — Z5111 Encounter for antineoplastic chemotherapy: Secondary | ICD-10-CM

## 2018-07-24 DIAGNOSIS — R971 Elevated cancer antigen 125 [CA 125]: Secondary | ICD-10-CM

## 2018-07-24 DIAGNOSIS — R110 Nausea: Secondary | ICD-10-CM

## 2018-07-24 DIAGNOSIS — J45909 Unspecified asthma, uncomplicated: Secondary | ICD-10-CM

## 2018-07-24 DIAGNOSIS — M129 Arthropathy, unspecified: Secondary | ICD-10-CM

## 2018-07-24 DIAGNOSIS — C569 Malignant neoplasm of unspecified ovary: Secondary | ICD-10-CM

## 2018-07-24 DIAGNOSIS — Z803 Family history of malignant neoplasm of breast: Secondary | ICD-10-CM

## 2018-07-24 DIAGNOSIS — Z8542 Personal history of malignant neoplasm of other parts of uterus: Secondary | ICD-10-CM

## 2018-07-24 DIAGNOSIS — Z87891 Personal history of nicotine dependence: Secondary | ICD-10-CM

## 2018-07-24 DIAGNOSIS — E860 Dehydration: Secondary | ICD-10-CM

## 2018-07-24 DIAGNOSIS — Z90710 Acquired absence of both cervix and uterus: Secondary | ICD-10-CM

## 2018-07-24 LAB — CBC WITH DIFFERENTIAL/PLATELET
ABS IMMATURE GRANULOCYTES: 0.02 10*3/uL (ref 0.00–0.07)
Basophils Absolute: 0 10*3/uL (ref 0.0–0.1)
Basophils Relative: 0 %
Eosinophils Absolute: 0 10*3/uL (ref 0.0–0.5)
Eosinophils Relative: 1 %
HCT: 29.7 % — ABNORMAL LOW (ref 36.0–46.0)
Hemoglobin: 9 g/dL — ABNORMAL LOW (ref 12.0–15.0)
Immature Granulocytes: 0 %
Lymphocytes Relative: 19 %
Lymphs Abs: 0.9 10*3/uL (ref 0.7–4.0)
MCH: 23.9 pg — ABNORMAL LOW (ref 26.0–34.0)
MCHC: 30.3 g/dL (ref 30.0–36.0)
MCV: 79 fL — ABNORMAL LOW (ref 80.0–100.0)
MONO ABS: 0.5 10*3/uL (ref 0.1–1.0)
Monocytes Relative: 11 %
Neutro Abs: 3.1 10*3/uL (ref 1.7–7.7)
Neutrophils Relative %: 69 %
PLATELETS: 368 10*3/uL (ref 150–400)
RBC: 3.76 MIL/uL — ABNORMAL LOW (ref 3.87–5.11)
RDW: 22.1 % — ABNORMAL HIGH (ref 11.5–15.5)
WBC: 4.5 10*3/uL (ref 4.0–10.5)
nRBC: 0 % (ref 0.0–0.2)

## 2018-07-24 LAB — CYTOLOGY - NON PAP

## 2018-07-24 LAB — COMPREHENSIVE METABOLIC PANEL
ALT: 12 U/L (ref 0–44)
AST: 16 U/L (ref 15–41)
Albumin: 3 g/dL — ABNORMAL LOW (ref 3.5–5.0)
Alkaline Phosphatase: 63 U/L (ref 38–126)
Anion gap: 10 (ref 5–15)
BUN: 7 mg/dL — ABNORMAL LOW (ref 8–23)
CO2: 23 mmol/L (ref 22–32)
Calcium: 8.5 mg/dL — ABNORMAL LOW (ref 8.9–10.3)
Chloride: 101 mmol/L (ref 98–111)
Creatinine, Ser: 0.46 mg/dL (ref 0.44–1.00)
GFR calc Af Amer: >60 mL/min (ref >60)
GFR calc non Af Amer: >60 mL/min (ref >60)
Glucose, Bld: 142 mg/dL — ABNORMAL HIGH (ref 70–99)
Potassium: 3.6 mmol/L (ref 3.5–5.1)
Sodium: 134 mmol/L — ABNORMAL LOW (ref 135–145)
Total Bilirubin: 0.6 mg/dL (ref 0.3–1.2)
Total Protein: 7.9 g/dL (ref 6.5–8.1)

## 2018-07-24 MED ORDER — SODIUM CHLORIDE 0.9 % IV SOLN
Freq: Once | INTRAVENOUS | Status: AC
  Administered 2018-07-24: 10:00:00 via INTRAVENOUS
  Filled 2018-07-24: qty 250

## 2018-07-24 MED ORDER — SODIUM CHLORIDE 0.9 % IV SOLN
641.5000 mg | Freq: Once | INTRAVENOUS | Status: AC
  Administered 2018-07-24: 640 mg via INTRAVENOUS
  Filled 2018-07-24: qty 64

## 2018-07-24 MED ORDER — FAMOTIDINE IN NACL 20-0.9 MG/50ML-% IV SOLN
20.0000 mg | Freq: Once | INTRAVENOUS | Status: AC
  Administered 2018-07-24: 20 mg via INTRAVENOUS
  Filled 2018-07-24: qty 50

## 2018-07-24 MED ORDER — DEXAMETHASONE SODIUM PHOSPHATE 10 MG/ML IJ SOLN
10.0000 mg | Freq: Once | INTRAMUSCULAR | Status: AC
  Administered 2018-07-24: 10 mg via INTRAVENOUS
  Filled 2018-07-24: qty 1

## 2018-07-24 MED ORDER — ALPRAZOLAM 0.25 MG PO TABS
0.2500 mg | ORAL_TABLET | Freq: Once | ORAL | Status: AC
  Administered 2018-07-24: 0.25 mg via ORAL
  Filled 2018-07-24: qty 1

## 2018-07-24 MED ORDER — SODIUM CHLORIDE 0.9 % IV SOLN
155.0000 mg/m2 | Freq: Once | INTRAVENOUS | Status: AC
  Administered 2018-07-24: 300 mg via INTRAVENOUS
  Filled 2018-07-24: qty 50

## 2018-07-24 MED ORDER — ALPRAZOLAM 0.25 MG PO TABS: 0.2500 mg | ORAL_TABLET | Freq: Two times a day (BID) | ORAL | 0 refills | Status: DC | PRN

## 2018-07-24 MED ORDER — DIPHENHYDRAMINE HCL 50 MG/ML IJ SOLN
25.0000 mg | Freq: Once | INTRAMUSCULAR | Status: AC
  Administered 2018-07-24: 25 mg via INTRAVENOUS
  Filled 2018-07-24: qty 1

## 2018-07-24 MED ORDER — PALONOSETRON HCL INJECTION 0.25 MG/5ML
0.2500 mg | Freq: Once | INTRAVENOUS | Status: AC
  Administered 2018-07-24: 0.25 mg via INTRAVENOUS
  Filled 2018-07-24: qty 5

## 2018-07-24 NOTE — Progress Notes (Signed)
Calculated carbo dose at ~570mg  (weight change).  Per MD do not change current dose of 640mg , keep at same dose.

## 2018-07-24 NOTE — Progress Notes (Signed)
HR 105, ok to proceed per md 

## 2018-07-24 NOTE — Progress Notes (Signed)
Patient is here today to follow up with her ovarian carcinoma. Patient stated that she has had acid reflux all the time and worse after she eats. Patient stated that she had been feeling a bit better but not quiet herself. Patient denied fever, chills, vomiting, constipation and diarrhea. Patient had an US paracentesis yesterday and she stated that she felt better after the fact.

## 2018-08-01 ENCOUNTER — Inpatient Hospital Stay: Payer: Self-pay | Attending: Oncology

## 2018-08-01 ENCOUNTER — Other Ambulatory Visit: Payer: Self-pay | Admitting: *Deleted

## 2018-08-01 ENCOUNTER — Inpatient Hospital Stay (HOSPITAL_BASED_OUTPATIENT_CLINIC_OR_DEPARTMENT_OTHER): Payer: Self-pay | Admitting: Oncology

## 2018-08-01 ENCOUNTER — Inpatient Hospital Stay: Payer: Self-pay

## 2018-08-01 ENCOUNTER — Telehealth: Payer: Self-pay | Admitting: *Deleted

## 2018-08-01 VITALS — Temp 98.8°F | Resp 16

## 2018-08-01 VITALS — Resp 16

## 2018-08-01 DIAGNOSIS — Z90722 Acquired absence of ovaries, bilateral: Secondary | ICD-10-CM | POA: Insufficient documentation

## 2018-08-01 DIAGNOSIS — M129 Arthropathy, unspecified: Secondary | ICD-10-CM | POA: Insufficient documentation

## 2018-08-01 DIAGNOSIS — Z87891 Personal history of nicotine dependence: Secondary | ICD-10-CM | POA: Insufficient documentation

## 2018-08-01 DIAGNOSIS — R112 Nausea with vomiting, unspecified: Secondary | ICD-10-CM

## 2018-08-01 DIAGNOSIS — C541 Malignant neoplasm of endometrium: Secondary | ICD-10-CM

## 2018-08-01 DIAGNOSIS — R202 Paresthesia of skin: Secondary | ICD-10-CM | POA: Insufficient documentation

## 2018-08-01 DIAGNOSIS — K219 Gastro-esophageal reflux disease without esophagitis: Secondary | ICD-10-CM | POA: Insufficient documentation

## 2018-08-01 DIAGNOSIS — M79605 Pain in left leg: Secondary | ICD-10-CM

## 2018-08-01 DIAGNOSIS — M79604 Pain in right leg: Secondary | ICD-10-CM

## 2018-08-01 DIAGNOSIS — K30 Functional dyspepsia: Secondary | ICD-10-CM | POA: Insufficient documentation

## 2018-08-01 DIAGNOSIS — J45909 Unspecified asthma, uncomplicated: Secondary | ICD-10-CM | POA: Insufficient documentation

## 2018-08-01 DIAGNOSIS — Z90710 Acquired absence of both cervix and uterus: Secondary | ICD-10-CM | POA: Insufficient documentation

## 2018-08-01 DIAGNOSIS — K5900 Constipation, unspecified: Secondary | ICD-10-CM

## 2018-08-01 DIAGNOSIS — D649 Anemia, unspecified: Secondary | ICD-10-CM | POA: Insufficient documentation

## 2018-08-01 DIAGNOSIS — R531 Weakness: Secondary | ICD-10-CM

## 2018-08-01 DIAGNOSIS — R5383 Other fatigue: Secondary | ICD-10-CM

## 2018-08-01 DIAGNOSIS — Z79899 Other long term (current) drug therapy: Secondary | ICD-10-CM

## 2018-08-01 DIAGNOSIS — Z79811 Long term (current) use of aromatase inhibitors: Secondary | ICD-10-CM

## 2018-08-01 DIAGNOSIS — R7989 Other specified abnormal findings of blood chemistry: Secondary | ICD-10-CM | POA: Insufficient documentation

## 2018-08-01 DIAGNOSIS — F419 Anxiety disorder, unspecified: Secondary | ICD-10-CM | POA: Insufficient documentation

## 2018-08-01 DIAGNOSIS — E860 Dehydration: Secondary | ICD-10-CM

## 2018-08-01 LAB — CBC WITH DIFFERENTIAL/PLATELET
Abs Immature Granulocytes: 0 10*3/uL (ref 0.00–0.07)
Basophils Absolute: 0 10*3/uL (ref 0.0–0.1)
Basophils Relative: 0 %
EOS PCT: 1 %
Eosinophils Absolute: 0 10*3/uL (ref 0.0–0.5)
HCT: 27.7 % — ABNORMAL LOW (ref 36.0–46.0)
Hemoglobin: 8.4 g/dL — ABNORMAL LOW (ref 12.0–15.0)
Immature Granulocytes: 0 %
Lymphocytes Relative: 31 %
Lymphs Abs: 0.9 10*3/uL (ref 0.7–4.0)
MCH: 23.9 pg — ABNORMAL LOW (ref 26.0–34.0)
MCHC: 30.3 g/dL (ref 30.0–36.0)
MCV: 78.9 fL — AB (ref 80.0–100.0)
Monocytes Absolute: 0.2 10*3/uL (ref 0.1–1.0)
Monocytes Relative: 7 %
Neutro Abs: 1.7 10*3/uL (ref 1.7–7.7)
Neutrophils Relative %: 61 %
PLATELETS: 361 10*3/uL (ref 150–400)
RBC: 3.51 MIL/uL — ABNORMAL LOW (ref 3.87–5.11)
RDW: 21.5 % — ABNORMAL HIGH (ref 11.5–15.5)
WBC: 2.8 10*3/uL — ABNORMAL LOW (ref 4.0–10.5)
nRBC: 0 % (ref 0.0–0.2)

## 2018-08-01 LAB — COMPREHENSIVE METABOLIC PANEL
ALT: 18 U/L (ref 0–44)
AST: 22 U/L (ref 15–41)
Albumin: 3 g/dL — ABNORMAL LOW (ref 3.5–5.0)
Alkaline Phosphatase: 74 U/L (ref 38–126)
Anion gap: 9 (ref 5–15)
BUN: 11 mg/dL (ref 8–23)
CHLORIDE: 98 mmol/L (ref 98–111)
CO2: 26 mmol/L (ref 22–32)
CREATININE: 0.39 mg/dL — AB (ref 0.44–1.00)
Calcium: 8.6 mg/dL — ABNORMAL LOW (ref 8.9–10.3)
GFR calc Af Amer: >60 mL/min (ref >60)
Glucose, Bld: 130 mg/dL — ABNORMAL HIGH (ref 70–99)
POTASSIUM: 3.7 mmol/L (ref 3.5–5.1)
Sodium: 133 mmol/L — ABNORMAL LOW (ref 135–145)
Total Bilirubin: 0.3 mg/dL (ref 0.3–1.2)
Total Protein: 8.2 g/dL — ABNORMAL HIGH (ref 6.5–8.1)

## 2018-08-01 MED ORDER — FAMOTIDINE IN NACL 20-0.9 MG/50ML-% IV SOLN
20.0000 mg | Freq: Once | INTRAVENOUS | Status: AC
  Administered 2018-08-01: 20 mg via INTRAVENOUS
  Filled 2018-08-01: qty 50

## 2018-08-01 MED ORDER — SODIUM CHLORIDE 0.9 % IV SOLN
Freq: Once | INTRAVENOUS | Status: AC
  Administered 2018-08-01: 11:00:00 via INTRAVENOUS
  Filled 2018-08-01: qty 250

## 2018-08-01 MED ORDER — ONDANSETRON HCL 4 MG/2ML IJ SOLN
8.0000 mg | Freq: Once | INTRAMUSCULAR | Status: AC
  Administered 2018-08-01: 8 mg via INTRAVENOUS
  Filled 2018-08-01: qty 4

## 2018-08-01 NOTE — Progress Notes (Signed)
Symptom Management Consult note [facility]  [phone] [phone]  Patient Care Team: Letta Median, MD as PCP - General (Family Medicine) Clent Jacks, RN as Registered Nurse Rico Junker, RN as Registered Nurse Theodore Demark, RN as Registered Nurse   Name of the patient: Elizabeth Davenport  [id number]  Jan 10, 1956   Date of visit: 08/01/2018  Diagnosis:  Recurrent low grade serous ovarian cancer   Chief Complaint: weakness, vomiting, GERD, constipation  Current Treatment: S/p 6 cycles dose reduced carbo/Taxol.  Last given on 07/24/2018.  Oncology History: Patient evaluated last by primary oncologist Dr. Grayland Ormond on 07/24/2018 prior to cycle 6.  She complained of intermittent abdominal discomfort, nausea and indigestion.  Patient has tolerated treatments fair to moderate, requiring IV fluids/medications and two paracentesis since initiating treatment.   She was evaluated in [facility] on several occasion for various complaints:  07/22/2018 for abdominal pain requiring ultrasound with possible paracentesis.  400 mL of fluid removed.  Received fluids and pain medication while in clinic. 07/03/2018 seen in Gyn-Onc for follow-up.  Trametinib was discussed as an additional therapeutic option should she progress on carbo/Taxol. 06/28/2018 for fatigue and weakness.  Given IV fluids. 06/21/2018 acid reflux and leg weakness.  Given IV fluids and started on gabapentin. 06/18/2018 for bilateral leg pain/burning, tingling and constipation.  Given IV fluids and started on bowel regimen. 06/03/2018 for acute onset abdominal swelling/discomfort and neck pain.  Required ultrasound and paracentesis where 1900 ml of fluid was removed.  Noted an increase in Ca1 25.  She is a Jehovah witness and does not accept blood products.  Agreeable to IV iron.  Imaging from 06/06/2018 was consistent with progression.  Anastrozole was discontinued and plan was to  reinitiate carbo/Taxol.  Started on 06/14/2018.  Elevated Ca125.  Oncology History   Elizabeth Davenport, presented as a 63 year old G2, P2 Hispanic female who developed light vaginal bleeding in early 2016.  Reports menopause in 2014.  Dr. Enzo Bi did a Pap that was normal.  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  Focally ER PR negative.  CT scan on 09/22/2014 abdomen/pelvis negative for metastatic disease.  She was referred to gynecologic and exam of cervix was normal.  Slides were reviewed at [facility] and read as probable endometrial primary.  She underwent TLH-BSO on 11/04/2014 at [facility].  Right ovarian excresence noted at surgery.  Survey of the rest of the abdomen, including omentum, and right diaphragm was negative.  Further staging was not performed due to patient's refusal to accept blood products that she is a Jehovah's Witness.  Pathology at [facility] showed a 2.6 cm grade 1 endometrial cancer without LVSI or cervical involvement.  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  No spread noted but washings were positive.  In view of the path report, suggestive of two primaries, pathology was reviewed at [facility].  The review confirmed that the uterine tumor is in early grade 1 endometrial adenocarcinoma.  However the ovarian excresences (were read as low-grade serous carcinoma) were interpreted as serous borderline tumors by [facility] pathologists (multiple).  The recommendation was for surveillance.  CA 125 05/05/2015 8.1 11/03/2015 12.7 05/10/2016 16.2 11/08/2016 15.4 05/23/2017 30.6 07/09/2017 25.2  On 10/31/2017 she presented to [facility] complaining of left lower quadrant abdominal pain which showed interval development of omental and peritoneal implants/metastatic disease and small amount of ascites with multiple small nodularities in the left upper quadrant including  the left hemidiaphragm consistent with peritoneal implants.  Mildly rounded nodular  density/lymph nodes at the cardiophrenic angles new and concerning for metastatic disease.  CT-guided biopsy performed. Pathology: A.  Omentum, left anterior abdomen-positive for involvement by epithelial neoplasm of gynecologic origin  11/14/2017 204.4  12/13/2017-underwent diagnostic laparoscopy, omentectomy, optimally debulked, for removal of mass.  Pathology-low-grade serous carcinoma in background of implants of serous borderline tumor.  Positive cytology ' suspicious for malignancy'.  Recommended to start chemotherapy followed by hormonal therapy.    01/22/2018 322.0 02/05/2018 314.0 02/26/2018 75.7  She completed 3 cycles of adjuvant carboplatin-Taxol on 02/26/2018.  Was started on letrozole, but on 04/01/2018 call the clinic and refused to take more due to bone and low back pain.  Plan was to washout and start anastrozole.  Trametinib was discussed if progressive disease.   03/19/2018 22.1  Presented to ER on 04/05/2018 complaining of low back pain and urinary frequency.  She was treated for possible UTI.  CT scan showed diffuse nodular infiltration throughout the mesentery and omentum consistent with peritoneal carcinomatosis. Started anastrozole.   04/10/2018 93.8 06/03/2018 134.0  Seen in symptom management clinic for abdominal swelling and discomfort.  Underwent paracentesis which yielded 1.9 L of fluid on 06/04/2018.Marland Kitchen  Fluid positive for malignancy.  CT chest/abdomen/pelvis on 06/06/2018 showed progression.  CT chest/abdomen/pelvis scan 06/06/2018 showed progression.  Patient required paracentesis.  Fluid positive for malignant cells.     Ovarian carcinoma, unspecified laterality ([place])   11/25/2014 Initial Diagnosis    Ovarian cancer, bilateral     Subjective Data:  ECOG: 1 - Symptomatic but completely ambulatory   Subjective:     Elizabeth Davenport is an 63 y.o. female who presents for evaluation of worsening heartburn and weakness. This has been associated with abdominal bloating,  bilious reflux, cough, early satiety, fullness after meals and heartburn. She denies chest pain, choking on food, cough, difficulty swallowing, hematemesis, melena, nausea and shortness of breath. Symptoms have been present for 5 weeks. She denies dysphagia. She denies melena, hematochezia, hematemesis, and coffee ground emesis. Medical therapy in the past has included: proton pump inhibitors.  The following portions of the patient's history were reviewed and updated as appropriate: allergies, current medications, past family history, past medical history, past social history, past surgical history and problem list.  Review of Systems Review of Systems  Constitutional: Positive for malaise/fatigue. Negative for chills, fever and weight loss.  HENT: Negative for congestion, ear pain and tinnitus.   Eyes: Negative.  Negative for blurred vision and double vision.  Respiratory: Negative.  Negative for cough, sputum production and shortness of breath.   Cardiovascular: Negative.  Negative for chest pain, palpitations and leg swelling.  Gastrointestinal: Positive for abdominal pain (With palpation), constipation, heartburn and vomiting. Negative for diarrhea and nausea.  Genitourinary: Negative for dysuria, frequency and urgency.  Musculoskeletal: Negative for back pain and falls.  Skin: Negative.  Negative for rash.  Neurological: Positive for weakness. Negative for headaches.  Endo/Heme/Allergies: Negative.  Does not bruise/bleed easily.  Psychiatric/Behavioral: Negative.  Negative for depression. The patient is not nervous/anxious and does not have insomnia.    Objective:       Physical Exam Constitutional:      Appearance: Normal appearance. She is obese.  HENT:     Head: Normocephalic and atraumatic.  Eyes:     Pupils: Pupils are equal, round, and reactive to light.  Neck:     Musculoskeletal: Normal range of motion.  Cardiovascular:  Rate and Rhythm: Regular rhythm. Tachycardia  present.     Heart sounds: Normal heart sounds. No murmur.  Pulmonary:     Effort: Pulmonary effort is normal.     Breath sounds: Normal breath sounds. No wheezing.  Abdominal:     General: Bowel sounds are normal. There is distension.     Palpations: Abdomen is soft.     Tenderness: There is abdominal tenderness.  Musculoskeletal: Normal range of motion.  Skin:    General: Skin is warm and dry.     Findings: No rash.  Neurological:     Mental Status: She is alert and oriented to person, place, and time.     Motor: Weakness present.  Psychiatric:        Judgment: Judgment normal.     Assessment:    Gastroesophageal Reflux Disease, likely due to malignancy.  Ca1 25 appears to be improving with initiation of carbo/Taxol.  Symptoms hopefully will improve.  Plan:    Stage Ia grade 1 endometrial cancer: Status post 6 cycles of carbo/Taxol.  Last given on 07/26/2018.  Cycle 5 dose reduced due to intolerance; decreasing performance status.  Diagnosed with low-grade serous cancer s/p laparoscopic omentectomy on 12/13/2017 with optimal debulking surgery.  Had 3 cycles of adjuvant carbo/Taxol completing on 02/26/2018.  Started him letrozole in October 2019 and switched to anastrozole due to progressive bony and low back pain.  Presented to [facility] 06/03/2017 for abdominal bloating and distention.  Had ultrasound-guided paracentesis where 1900 mL's of fluid was removed positive for malignancy.  Repeat abdomen/pelvis imaging completed on 06/06/2018 revealed progression of disease with a rising Ca 125.  GERD/acid reflux: Chronic problem.  Started on Protonix in December.  Taking 40 mg daily.  Unclear if taking daily or intermittently when needed.  Discussed with Dr. Grayland Ormond, and this could also be related to malignancy.  Given Ca1 25 is trending down, hopefully symptoms will slowly resolve with decrease of disease burden.  Will increase Protonix to 40 mg before breakfast and 40 mg before dinner.  We  discussed medication adherence: Protonix should be given 30 to 60 minutes before breakfast.  If symptoms unrelieved, we can switch to lansoprazole 30 mg once daily.  Up-to-date recommends doubling Protonix for 8 weeks before switching PPIs.  If acid reflux/GERD continues to worsen, GI referral may be necessary.  Other options include adding an H2 receptor antagonist like Zantac at bedtime and or Reglan 3 times daily.  Baclofen can be added as adjunct of treatment for patient's persistent symptoms despite a PPI and H2 RA.  Constipation: Started taking MiraLAX yesterday.  Had 2 hard formed stools yesterday.  Admits to some straining.  Denies bleeding.  Recommend continuing MiraLAX daily.  Can add Senokot if unrelieved with MiraLAX.  Discussed compliance with medications and to maintain bowel regimen throughout her treatment to prevent constipation in the future.  Education printed and given.  Anemia: Continue to decline and is 8.4 today.  History of iron deficiency with iron saturations of 10%.  Patient is a Jehovah witness and does not accept blood products.  Has had and tolerated  IV Feraheme in the past.  Will get her set up for IV iron in the next couple days to prevent further decline of her hemoglobin.  Received IV Feraheme last on 07/03/2018.   Plan: Give 1 L NaClin clinic.  Give IV Pepcid. Give 8 mg Zofran.  Disposition: RTC tomorrow for IV iron Feraheme.  RTC as scheduled for cycle 7  carbotaxol on 08/14/2018.  Greater than 50% was spent in counseling and coordination of care with this patient including but not limited to discussion of the relevant topics above (See A&P) including, but not limited to diagnosis and management of acute and chronic medical conditions.   Faythe Casa, NP 08/01/2018 2:42 PM

## 2018-08-01 NOTE — Telephone Encounter (Signed)
Patient's daughter called to say that Mava is very weak and can't keep anything down; she is also constipated. Offered her an appointment with NP in symptom management clinic this morning and she accepted. Appointment made for labs at 10:15, and to see Beckey Rutter, NP at 10:30.     dhs

## 2018-08-01 NOTE — Patient Instructions (Addendum)
I am sorry you are feeling poorly today.  Today you were given IV Pepcid for indigestion and IV Zofran.  You were also given 1 L of NaCl.  Previously, you were started on Protonix 40 mg daily for acid reflux.  Let us try increasing your Protonix from 40 mg daily to 40 mg twice a day.  This medication must be taken 30 to 60 minutes prior to breakfast and again before dinner.  If this does not improve symptoms, we can add an H2 receptor antagonist like Pepcid 10 mg to your bedtime dosing and or Reglan three times a day.   Given your hemoglobin continues to drop due to chemotherapy and your low iron levels, we should get you set up for an IV infusion within the next few days. Scheduled for Monday at 930.   For your constipation let us try MiraLAX daily.  If this does not result in a bowel movement and/or softer bowel movements we can try adding Senokot.  Let me know if you experience diarrhea.   IV fluids were given in clinic to help hydrate and hopefully reduce effects of constipation.  We discussed the foods that are high in insoluble fiber such as fruits and vegetables can be helpful when experiencing constipation and also discussed the importance of staying well-hydrated.   Estreimiento en los adultos Constipation, Adult Se llama estreimiento cuando:  Tiene deposiciones (defeca) una menor cantidad de veces a la semana de lo normal.  Tiene dificultad para defecar.  Las heces son secas y duras o son ms grandes que lo normal. Siga estas indicaciones en su casa: Comida y bebida   Consuma alimentos con alto contenido de [place], por ejemplo: ? Lambert Mody y verduras frescas. ? Cereales integrales. ? Frijoles.  Consuma una menor cantidad de alimentos ricos en grasas, con bajo contenido de [place] o excesivamente procesados, como: ? Papas fritas. ? Hamburguesas. ? Galletas. ? Caramelos. ? Gaseosas.  Beba suficiente lquido para mantener el pis (orina) claro o de color amarillo  plido. Instrucciones generales  Haga actividad fsica con regularidad o segn las indicaciones del mdico.  Vaya al bao cuando sienta la necesidad de defecar. No se aguante las ganas.  Tome los medicamentos de venta libre y los recetados solamente como se lo haya indicado el mdico. Estos incluyen los suplementos de [place].  Realice ejercicios de reentrenamiento del suelo plvico, como: ? Respirar profundamente mientras relaja la parte inferior del vientre (abdomen). ? Relajar el suelo plvico mientras defeca.  Controle su afeccin para ver si hay cambios.  Concurra a todas las visitas de control como se lo haya indicado el mdico. Esto es importante. Comunquese con un mdico si:  Siente un dolor que empeora.  Tiene fiebre.  No ha defecado por 4das.  Vomita.  No tiene hambre.  Pierde peso.  Tiene una hemorragia en el ano.  Las deposiciones Media planner) son delgadas como un lpiz. Solicite ayuda de inmediato si:  Jaclynn Guarneri, y los sntomas empeoran de repente.  Tiene prdida de materia fecal u observa [employer].  Siente el vientre ms duro o ms grande de lo normal (est hinchado).  Siente un dolor muy intenso en el vientre.  Se siente mareado o se desmaya. Esta informacin no tiene Marine scientist el consejo del mdico. Asegrese de hacerle al mdico cualquier pregunta que tenga. Document Released: 07/15/2010 Document Revised: 09/13/2016 Document Reviewed: 12/01/2015 Elsevier Interactive Patient Education  2019 [place] reflujo gastroesofgico en los  adultos Gastroesophageal Reflux Disease, Adult El reflujo gastroesofgico (RGE) ocurre cuando el cido del estmago sube por el tubo que conecta la boca con el estmago (esfago). Normalmente, la comida baja por el esfago y se mantiene en el estmago, donde se la digiere. Cuando una persona tiene RGE, los alimentos y el cido estomacal suelen volver al esfago. Usted puede tener una  enfermedad llamada enfermedad de reflujo gastroesofgico (ERGE) si el reflujo:  Sucede a menudo.  Causa sntomas frecuentes o muy intensos.  Causa problemas tales como dao en el esfago. Cuando esto ocurre, el esfago duele y se hincha (inflama). Con el tiempo, la ERGE puede ocasionar pequeos agujeros (lceras) en el revestimiento del esfago. Cules son las causas? Esta afeccin se debe a un problema en el msculo que se encuentra entre el esfago y [place]. Cuando este msculo est dbil o no es normal, no se cierra correctamente para impedir que los alimentos y el cido regresen del Paramedic. El msculo puede debilitarse debido a lo siguiente:  El consumo de [place].  [place].  Tener cierto tipo de hernia (hernia de hiato).  Consumo de alcohol.  Ciertos alimentos y bebidas, como caf, chocolate, cebollas y [place]. Qu incrementa el riesgo? Es ms probable que tenga esta afeccin si:  Tiene sobrepeso.  Tiene una enfermedad que afecta el tejido conjuntivo.  [place] antiinflamatorios no esteroideos (AINE). Cules son los signos o los sntomas? Los sntomas de esta afeccin incluyen:  Acidez estomacal.  Dificultad o dolor al tragar.  Sensacin de Best boy un bulto en la garganta.  Sabor amargo en la boca.  Mal aliento.  Tener una gran cantidad de saliva.  Estmago inflamado o con Tree surgeon.  Eructos.  Dolor en el pecho. El dolor de pecho puede deberse a distintas afecciones. Asegrese de Teacher, adult education a su mdico si tiene Tourist information centre manager.  Falta de aire o respiracin ruidosa (sibilancias).  Tos constante (crnica) o durante la noche.  Desgaste de la superficie de los dientes (esmalte dental).  Prdida de peso. Cmo se trata? El tratamiento depender de la gravedad de los sntomas. El mdico puede sugerirle lo siguiente:  Cambios en la dieta.  Medicamentos.  Clementeen Hoof. Siga estas indicaciones en su casa: Comida y bebida   Siga una dieta como se lo  haya indicado el mdico. Es posible que deba evitar alimentos y bebidas, por ejemplo: ? Caf y t (con o sin cafena). ? Bebidas que contengan alcohol. ? Bebidas energticas y deportivas. ? Bebidas gaseosas y refrescos. ? Chocolate y cacao. ? Menta y [place]. ? Ajo y cebolla. ? Rbano picante. ? Alimentos cidos y condimentados. Estos incluyen todos los tipos de pimientos, [place] en polvo, curry en polvo, vinagre, salsas picantes y [employer]. ? Ctricos y sus jugos, por ejemplo, naranjas, limones y limas. ? Alimentos que [employer]. Estos incluyen salsa roja, [place], salsa picante y pizza con salsa de [place]. ? Alimentos fritos y Radio broadcast assistant. Estos incluyen donas, papas fritas, papitas fritas de bolsa y aderezos con alto contenido de [place]. ? Carnes con alto contenido de [place]. Estas incluye los perros calientes, chuletas o costillas, embutidos, jamn y tocino. ? Productos lcteos ricos en grasas, como leche [place], [place] y [place] crema.  Consuma pequeas cantidades de comida con ms frecuencia. Evite consumir porciones abundantes.  Evite beber grandes cantidades de lquidos con las comidas.  Evite comer 2 o 3horas antes de acostarse.  Evite recostarse inmediatamente despus de comer.  No haga ejercicios enseguida despus de comer. Kary Kos  de vida   No consuma ningn producto que contenga nicotina o tabaco. Estos incluyen cigarrillos, cigarrillos electrnicos y tabaco para Higher education careers adviser. Si necesita ayuda para dejar de fumar, consulte al [employer].  Intente reducir [employer] de estrs. Si necesita ayuda para hacer esto, consulte al mdico.  Si tiene sobrepeso, baje una cantidad de peso saludable para usted. Consulte a su mdico para bajar de peso de [employer]. Indicaciones generales  Est atento a cualquier cambio en los sntomas.  Tome los medicamentos de venta libre y los recetados solamente como se lo haya indicado el mdico. No tome aspirina, ibuprofeno ni otros AINE a  menos que el mdico lo autorice.  Use ropa holgada. No use nada apretado alrededor de la cintura.  Levante (eleve) la cabecera de la cama aproximadamente 6pulgadas (15cm).  Evite inclinarse si al hacerlo empeoran los sntomas.  Concurra a todas las visitas de seguimiento como se lo haya indicado el mdico. Esto es importante. Comunquese con un mdico si:  Aparecen nuevos sntomas.  Adelgaza y no sabe por qu.  Tiene problemas para tragar o le duele cuando traga.  Tiene sibilancias o tos persistente.  Los sntomas no mejoran con Dispensing optician.  Tiene la voz ronca. Solicite ayuda inmediatamente si:  [employer], el cuello, la [place], los dientes o la espalda.  Se siente transpirado, mareado o tiene una sensacin de desvanecimiento.  Siente falta de aire o Tourist information centre manager.  Vomita y el vmito tiene un aspecto similar a la sangre o a los posos de caf.  Pierde el conocimiento (se desmaya).  Las deposiciones (heces) son sanguinolentas o negras.  No puede tragar, beber o comer. Resumen  Si una persona tiene enfermedad de reflujo gastroesofgico (ERGE), los alimentos y el cido estomacal suben al esfago y causan sntomas o problemas tales como dao en el esfago.  El tratamiento depender de la gravedad de los sntomas.  Siga una Lexicographer se lo haya indicado el [place] todos los medicamentos solamente como se lo haya indicado el mdico. Esta informacin no tiene Marine scientist el consejo del mdico. Asegrese de hacerle al mdico cualquier pregunta que tenga. Document Released: 07/15/2010 Document Revised: 01/24/2018 Document Reviewed: 01/24/2018 Elsevier Interactive Patient Education  2019 [place] rica en fibra High-Fiber Diet [place], tambin llamada fibra dietaria, es un tipo de carbohidrato que se encuentra en las frutas, las verduras, los cereales integrales y los frijoles. Una dieta rica en fibra puede tener  muchos beneficios para la salud. El mdico puede recomendar una dieta rica en fibra para ayudar a:  Contractor. La fibra puede hacer que defeque con ms frecuencia.  Disminuir el nivel de colesterol.  Aliviar las siguientes afecciones: ? Hinchazn de la venas en el ano (hemorroides). ? Hinchazn e irritacin (inflamacin) de zonas especficas del tracto digestivo (diverticulitis sin complicaciones). ? Un problema del intestino grueso (colon) que, a veces, causa dolor y diarrea (sndrome de colon irritable, SCI).  Evitar comer en exceso como parte de un plan para bajar de peso.  Evitar la enfermedad cardaca, la diabetes tipo 2 y ciertos cnceres. En qu consiste el plan? El consumo diario recomendado de fibra en gramos (g) incluye lo siguiente:  38g para hombres de 64 aos o menos.  30g para los hombres mayores de 50 aos.  25g para mujeres de 50 aos o menos.  21g para las Cendant Corporation de 50 aos. Puede recibir la ingesta diaria recomendada  de fibra dietaria de la siguiente manera:  Coma una variedad de frutas, verduras, granos y frijoles.  Tome un suplemento de [place], si no es posible comer suficiente fibra en su dieta. Qu debo saber acerca de la dieta rica en fibra?  Es mejor obtener fibra directamente de los alimentos en lugar de recibirla de suplementos de [place]. No hay demasiada investigacin acerca de qu tan efectivos son los suplementos.  Verifique siempre el contenido de fibra en la etiqueta de informacin nutricional de los alimentos preenvasados. Busque alimentos que contengan 5g o ms de fibra por porcin.  Hable con un especialista en alimentacin y nutricin (nutricionista) si tiene preguntas sobre alimentos especficos que se recomiendan o no para su afeccin mdica, especialmente si aquellos alimentos no estn detallados a continuacin.  Aumente gradualmente la cantidad de fibra que consume. Si aumenta demasiado rpido el consumo de fibra  dietaria puede provocar distensin abdominal, clicos o gases.  Beba abundante agua. El [place] a Economist. Cules son algunos consejos para seguir este plan?  Consuma una gran variedad de alimentos ricos en fibra.  Asegrese de que al Land[employer] Financial mitad de los granos que ingiere cada da sean cereales integrales.  Coma panes y cereales hechos con harina de cereales integrales en lugar de harina refinada o blanca.  Coma arroz integral, trigo burgol o mijo en lugar de Neurosurgeon con un desayuno rico en fibra, como un cereal que contenga al menos 5g de fibra o ms por porcin.  Use frijoles en lugar de carne en las sopas, ensaladas o platos de pastas.  Coma colaciones ricas en fibra, como frutos rojos, verduras crudas, frutos secos y palomitas de maz.  Elija frutas y verduras Insurance claims handler de formas procesadas como jugo o salsa. Qu alimentos puedo comer?  Frutas Frutos rojos. Peras. Manzanas. Naranjas. [place]. Ciruelas y pasas. Higos secos. Verduras Batatas. [place]. Col rizada. Alc[place]ofas. Repollo. Brcoli. Coliflor. Guisantes. Zanahorias. [place]. Cereales Panes integrales. Cereal multigrano. Avena. Arroz integral. Dwyane Luo. Trigo burgol. Mijo. Quinua. Magdalenas de salvado. Palomitas de maz. Galletas de centeno. Carnes y otras protenas Frijoles blancos, colorados y pintos. Soja. Guisantes secos. Lentejas. Frutos secos y semillas. Lcteos Yogur fortificado con Pharmacist, hospital. Bebidas Leche de soja fortificada con Fredderick [place]. Jugo de naranja fortificado con Fredderick Phenix. Otros alimentos Barras de [place]. Es posible que los productos mencionados arriba no formen una lista completa de las bebidas y los alimentos recomendados. Comunquese con un nutricionista para conocer ms opciones. Qu alimentos no se recomiendan? Frutas Jugo de frutas. Frutas cocidas coladas. Verduras Papas fritas. Verduras enlatadas. Verduras muy cocidas. Cereales Pan blanco. Pastas  hechas con Letitia Neri. Arroz Kerby Moors y otras protenas [place] de carne con grasa. Pollo o pescado fritos. Lcteos Leche. Yogur. Queso crema. Rite Aid. Grasas y [employer]. Bebidas Gaseosas. Otros alimentos Tortas y pasteles. Es posible que los productos que se enumeran ms arriba no sean una lista completa de los alimentos y las bebidas que se Higher education careers adviser. Comunquese con un nutricionista para obtener ms informacin. Resumen  La fibra es un tipo de carbohidrato. Se encuentra en las frutas, las verduras, los cereales integrales y los frijoles.  Una dieta rica en fibra representa muchos beneficios para la salud, como evitar el estreimiento, Management consultant colesterol en la [place], ayudar a perder peso y reducir el riesgo de enfermedad cardaca, diabetes y ciertos tipos de cncer.  Aumente gradualmente la ingesta de fibra. El aumento demasiado rpido puede provocar clicos, distensin abdominal y  gases. Karma Greaser agua a la vez que aumenta la [place].  Las mejores fuentes de fibra incluyen frutas y verduras enteras, granos integrales, frutos secos, semillas y frijoles. Esta informacin no tiene Marine scientist el consejo del mdico. Asegrese de hacerle al mdico cualquier pregunta que tenga. Document Released: 06/12/2005 Document Revised: 06/01/2017 Document Reviewed: 06/01/2017 Elsevier Interactive Patient Education  2019 [employer].

## 2018-08-02 ENCOUNTER — Inpatient Hospital Stay: Payer: Self-pay

## 2018-08-02 VITALS — BP 113/72 | HR 87 | Temp 98.0°F | Resp 16

## 2018-08-02 DIAGNOSIS — D509 Iron deficiency anemia, unspecified: Secondary | ICD-10-CM

## 2018-08-02 DIAGNOSIS — C569 Malignant neoplasm of unspecified ovary: Secondary | ICD-10-CM

## 2018-08-02 MED ORDER — SODIUM CHLORIDE 0.9 % IV SOLN
510.0000 mg | Freq: Once | INTRAVENOUS | Status: AC
  Administered 2018-08-02: 510 mg via INTRAVENOUS
  Filled 2018-08-02: qty 17

## 2018-08-02 MED ORDER — SODIUM CHLORIDE 0.9 % IV SOLN
Freq: Once | INTRAVENOUS | Status: AC
  Administered 2018-08-02: 11:00:00 via INTRAVENOUS
  Filled 2018-08-02: qty 250

## 2018-08-05 ENCOUNTER — Inpatient Hospital Stay: Payer: Self-pay

## 2018-08-11 NOTE — Progress Notes (Signed)
[employer]  [phone] [phone]  ID: Elizabeth Davenport OB: 1955-10-10  MR#: [id number]  XBM#:[id number]  Patient Care Team: Letta Median, MD as PCP - General (Family Medicine) Clent Jacks, RN as Registered Nurse Rico Junker, RN as Registered Nurse Theodore Demark, RN as Registered Nurse  CHIEF COMPLAINT: Progressive low-grade ovarian malignancy  INTERVAL HISTORY: Patient returns to clinic today for further evaluation and consideration of cycle 4 of carboplatinum and Taxol.  She had mild abdominal pain yesterday, but this is since resolved.  She currently feels well and is asymptomatic.  She has chronic weakness and fatigue. She has no neurologic complaints.  She denies any fevers.  She has a fair appetite and denies weight loss.  She has no chest pain or shortness of breath.  She denies any nausea, vomiting, constipation, or diarrhea.  She has no urinary complaints.  Patient offers no further specific complaints today.  REVIEW OF SYSTEMS:   Review of Systems  Constitutional: Positive for malaise/fatigue. Negative for fever and weight loss.  Respiratory: Negative.  Negative for cough and shortness of breath.   Cardiovascular: Negative.  Negative for chest pain and leg swelling.  Gastrointestinal: Negative.  Negative for abdominal pain, constipation, diarrhea, heartburn, nausea and vomiting.  Genitourinary: Negative.  Negative for dysuria.  Musculoskeletal: Negative.  Negative for back pain.  Skin: Negative.  Negative for rash.  Neurological: Positive for weakness. Negative for dizziness, sensory change, focal weakness and headaches.  Psychiatric/Behavioral: Negative.  The patient is not nervous/anxious.     As per HPI. Otherwise, a complete review of systems is negative.  PAST MEDICAL HISTORY: Past Medical History:  Diagnosis Date  . Arthritis    left knee  . Asthma   . Endometrial cancer ([place]) 11/04/14  . PONV  (postoperative nausea and vomiting)     PAST SURGICAL HISTORY: Past Surgical History:  Procedure Laterality Date  . ABDOMINAL HYSTERECTOMY    . LAPAROSCOPIC BILATERAL SALPINGO OOPHERECTOMY Bilateral 11/04/2014   Procedure: LAPAROSCOPIC BILATERAL SALPINGO OOPHORECTOMY;  Surgeon: Mellody Drown, MD;  Location: [facility] ORS;  Service: Gynecology;  Laterality: Bilateral;  . LAPAROSCOPIC HYSTERECTOMY N/A 11/04/2014   Procedure: HYSTERECTOMY TOTAL LAPAROSCOPIC;  Surgeon: Mellody Drown, MD;  Location: [facility] ORS;  Service: Gynecology;  Laterality: N/A;  . removed mass from thyriod      FAMILY HISTORY: Family History  Problem Relation Age of Onset  . Breast cancer Sister 95  . Asthma Mother   . Coronary artery disease Mother   . Diabetes Brother   . Diabetes Sister   . Diabetes Sister   . Diabetes Brother     ADVANCED DIRECTIVES (Y/N):  N  HEALTH MAINTENANCE: Social History   Tobacco Use  . Smoking status: Former Smoker    Packs/day: 0.25    Years: 8.00    Pack years: 2.00    Types: Cigarettes    Last attempt to quit: 06/29/1990    Years since quitting: 28.1  . Smokeless tobacco: Never Used  . Tobacco comment: Smoking History Cigarettes 1 cig per day; quit 22 years ago; smoked for 8 years  Substance Use Topics  . Alcohol use: No    Alcohol/week: 0.0 standard drinks    Comment: occasional   . Drug use: No     Colonoscopy:  PAP:  Bone density:  Lipid panel:  No Known Allergies  Current Outpatient Medications  Medication Sig Dispense Refill  . acetaminophen (TYLENOL) 500 MG tablet Take 500 mg by mouth  every 6 (six) hours as needed.    Marland Kitchen albuterol (PROVENTIL HFA;VENTOLIN HFA) 108 (90 BASE) MCG/ACT inhaler Inhale 2 puffs into the lungs every 6 (six) hours as needed for wheezing or shortness of breath.    . ALPRAZolam (XANAX) 0.25 MG tablet Take 1 tablet (0.25 mg total) by mouth 2 (two) times daily as needed for anxiety. 30 tablet 0  . gabapentin (NEURONTIN) 300 MG capsule Take  1 capsule (300 mg total) by mouth 2 (two) times daily. Neuropathic pain 30 capsule 0  . pantoprazole (PROTONIX) 40 MG tablet Take 1 tablet (40 mg total) by mouth daily. For acid reflux 90 tablet 3  . polyethylene glycol (MIRALAX) packet Take 17 g by mouth daily. 14 each 0  . exemestane (AROMASIN) 25 MG tablet Take 1 tablet (25 mg total) by mouth daily after breakfast. (Patient not taking: Reported on 08/14/2018) 30 tablet 3  . fluticasone (FLOVENT HFA) 220 MCG/ACT inhaler Take by mouth.    . ondansetron (ZOFRAN) 8 MG tablet Take 1 tablet (8 mg total) by mouth 2 (two) times daily as needed for refractory nausea / vomiting. (Patient not taking: Reported on 08/14/2018) 30 tablet 2  . prochlorperazine (COMPAZINE) 10 MG tablet Take 1 tablet (10 mg total) by mouth every 6 (six) hours as needed (Nausea or vomiting). (Patient not taking: Reported on 08/14/2018) 60 tablet 2  . senna-docusate (SENNA S) 8.6-50 MG tablet Take 1 tablet by mouth daily. (Patient not taking: Reported on 08/14/2018) 30 tablet 0  . traMADol (ULTRAM) 50 MG tablet Take 1 tablet (50 mg total) by mouth every 6 (six) hours as needed for moderate pain or severe pain. (Patient not taking: Reported on 08/14/2018) 5 tablet 0   No current facility-administered medications for this visit.    Facility-Administered Medications Ordered in Other Visits  Medication Dose Route Frequency Provider Last Rate Last Dose  . CARBOplatin (PARAPLATIN) 560 mg in sodium chloride 0.9 % 250 mL chemo infusion  560 mg Intravenous Once Lloyd Huger, MD 612 mL/hr at 08/14/18 1421 560 mg at 08/14/18 1421    OBJECTIVE: Vitals:   08/14/18 0841  BP: 122/74  Pulse: 80  Temp: 97.8 F (36.6 C)     Body mass index is 30.97 kg/m.    ECOG FS:0 - Asymptomatic  General: Well-developed, well-nourished, no acute distress. Eyes: Pink conjunctiva, anicteric sclera. HEENT: Normocephalic, moist mucous membranes. Lungs: Clear to auscultation bilaterally. Heart: Regular  rate and rhythm. No rubs, murmurs, or gallops. Abdomen: Soft, nontender, nondistended. No organomegaly noted, normoactive bowel sounds. Musculoskeletal: No edema, cyanosis, or clubbing. Neuro: Alert, answering all questions appropriately. Cranial nerves grossly intact. Skin: No rashes or petechiae noted. Psych: Normal affect.  LAB RESULTS:  Lab Results  Component Value Date   NA 135 08/14/2018   K 3.3 (L) 08/14/2018   CL 101 08/14/2018   CO2 23 08/14/2018   GLUCOSE 130 (H) 08/14/2018   BUN 7 (L) 08/14/2018   CREATININE 0.41 (L) 08/14/2018   CALCIUM 8.6 (L) 08/14/2018   PROT 8.2 (H) 08/14/2018   ALBUMIN 3.1 (L) 08/14/2018   AST 15 08/14/2018   ALT 11 08/14/2018   ALKPHOS 57 08/14/2018   BILITOT 0.5 08/14/2018   GFRNONAA >60 08/14/2018   GFRAA >60 08/14/2018    Lab Results  Component Value Date   WBC 4.7 08/14/2018   NEUTROABS 3.0 08/14/2018   HGB 9.0 (L) 08/14/2018   HCT 29.0 (L) 08/14/2018   MCV 81.7 08/14/2018   PLT 281 08/14/2018  STUDIES: US Paracentesis  Result Date: 07/23/2018 CLINICAL DATA:  Ovarian and endometrial carcinoma. Abdominal ascites. EXAM: ULTRASOUND GUIDED PARACENTESIS TECHNIQUE: The procedure, risks (including but not limited to bleeding, infection, organ damage ), benefits, and alternatives were explained to the patient with the aid of interpreter. Questions regarding the procedure were encouraged and answered. The patient understands and consents to the procedure. Survey ultrasound of the abdomen was performed and an appropriate skin entry site in the right lateral abdomen was selected. Skin site was marked, prepped with chlorhexadine, draped in usual sterile fashion, and infiltrated locally with 1% lidocaine. A 1F multisidehole Yueh sheath needle was advanced into the peritoneal space under real-time ultrasound guidance, until fluid could be aspirated. The sheath was advanced and the needle removed. 410 mL of yellow ascites were aspirated. The  patient tolerated the procedure well. COMPLICATIONS: COMPLICATIONS none IMPRESSION: Technically successful ultrasound guided paracentesis, removing 410 mL yellow ascites, sent for the requested laboratory studies. Electronically Signed   By: Lucrezia Europe M.D.   On: 07/23/2018 14:21    ASSESSMENT: Progressive low-grade ovarian malignancy. PLAN:   1.  Progressive low-grade ovarian malignancy: CT scan results from June 06, 2018 reviewed independently with clear progression of disease.  Patient CA-125 is trending down and is now 95.6, today's result is pending.  Previously, patient underwent optimal surgical debulking and then completed 3 cycles of adjuvant carboplatinum and Taxol on February 26, 2018.  Patient was initially placed on maintenance letrozole, but could not tolerate treatment and this was switched to Aromasin. Hospice and end-of-life care were previously discussed, but patient declined and wished to additional treatment.  Because of progressive disease, patient was reinitiated on carboplatinum and Taxol.  Proceed with cycle 4 today.  Return to clinic in 3 weeks for further evaluation and consideration of cycle 5.  Plan to reimage after cycle 6.       2.  History of endometrial cancer: Patient had total hysterectomy.  Imaging as above. 3.  Anemia: Patient's hemoglobin has mildly improved to 9.0.  She continues to have persistently decreased iron stores.  Patient last received IV Feraheme on August 02, 2018.  Consider repeat treatment in the near future. 4.  Leukopenia: Resolved.   5.  Anxiety: Continue Xanax as needed.  Patient was given a refill today. 6.  Indigestion: Continue Protonix as prescribed. 7.  Elevated ferritin: Likely secondary to acute phase reactant secondary to malignancy.  Monitor.  The entire visit was done in the presence of an interpreter.  Patient expressed understanding and was in agreement with this plan. She also understands that She can call clinic at any time  with any questions, concerns, or complaints.   Cancer Staging Borderline epithelial neoplasm of ovary Staging form: Ovary, Fallopian Tube, and Primary Peritoneal Carcinoma, AJCC 8th Edition - Clinical stage from 01/11/2018: FIGO Stage III (cT3, cN0, cM0) - Signed by Lloyd Huger, MD on 01/11/2018  Endometrial cancer [facility]) Staging form: Corpus Uteri - Carcinoma, AJCC 7th Edition - Clinical: Stage I (T1, N0, M0) - Unsigned  Ovarian carcinoma, unspecified laterality ([place]) Staging form: Ovary, AJCC 7th Edition - Clinical: Stage Unknown (T1c, NX, M0) - Signed by Forest Gleason, MD on 11/27/2014   Lloyd Huger, MD   08/14/2018 2:24 PM

## 2018-08-12 ENCOUNTER — Other Ambulatory Visit: Payer: Self-pay | Admitting: Oncology

## 2018-08-14 ENCOUNTER — Other Ambulatory Visit: Payer: Self-pay

## 2018-08-14 ENCOUNTER — Inpatient Hospital Stay: Payer: Self-pay

## 2018-08-14 ENCOUNTER — Inpatient Hospital Stay (HOSPITAL_BASED_OUTPATIENT_CLINIC_OR_DEPARTMENT_OTHER): Payer: Self-pay | Admitting: Oncology

## 2018-08-14 VITALS — BP 122/74 | HR 80 | Temp 97.8°F | Ht 61.0 in | Wt 163.9 lb

## 2018-08-14 DIAGNOSIS — Z90722 Acquired absence of ovaries, bilateral: Secondary | ICD-10-CM

## 2018-08-14 DIAGNOSIS — Z79811 Long term (current) use of aromatase inhibitors: Secondary | ICD-10-CM

## 2018-08-14 DIAGNOSIS — Z87891 Personal history of nicotine dependence: Secondary | ICD-10-CM

## 2018-08-14 DIAGNOSIS — K30 Functional dyspepsia: Secondary | ICD-10-CM

## 2018-08-14 DIAGNOSIS — R202 Paresthesia of skin: Secondary | ICD-10-CM

## 2018-08-14 DIAGNOSIS — M79604 Pain in right leg: Secondary | ICD-10-CM

## 2018-08-14 DIAGNOSIS — Z79899 Other long term (current) drug therapy: Secondary | ICD-10-CM

## 2018-08-14 DIAGNOSIS — C541 Malignant neoplasm of endometrium: Secondary | ICD-10-CM

## 2018-08-14 DIAGNOSIS — M129 Arthropathy, unspecified: Secondary | ICD-10-CM

## 2018-08-14 DIAGNOSIS — M79605 Pain in left leg: Secondary | ICD-10-CM

## 2018-08-14 DIAGNOSIS — K219 Gastro-esophageal reflux disease without esophagitis: Secondary | ICD-10-CM

## 2018-08-14 DIAGNOSIS — K5900 Constipation, unspecified: Secondary | ICD-10-CM

## 2018-08-14 DIAGNOSIS — R112 Nausea with vomiting, unspecified: Secondary | ICD-10-CM

## 2018-08-14 DIAGNOSIS — R531 Weakness: Secondary | ICD-10-CM

## 2018-08-14 DIAGNOSIS — R7989 Other specified abnormal findings of blood chemistry: Secondary | ICD-10-CM

## 2018-08-14 DIAGNOSIS — C569 Malignant neoplasm of unspecified ovary: Secondary | ICD-10-CM

## 2018-08-14 DIAGNOSIS — Z90710 Acquired absence of both cervix and uterus: Secondary | ICD-10-CM

## 2018-08-14 DIAGNOSIS — J45909 Unspecified asthma, uncomplicated: Secondary | ICD-10-CM

## 2018-08-14 DIAGNOSIS — D649 Anemia, unspecified: Secondary | ICD-10-CM

## 2018-08-14 DIAGNOSIS — D3910 Neoplasm of uncertain behavior of unspecified ovary: Secondary | ICD-10-CM

## 2018-08-14 DIAGNOSIS — F419 Anxiety disorder, unspecified: Secondary | ICD-10-CM

## 2018-08-14 DIAGNOSIS — R5383 Other fatigue: Secondary | ICD-10-CM

## 2018-08-14 LAB — COMPREHENSIVE METABOLIC PANEL
ALBUMIN: 3.1 g/dL — AB (ref 3.5–5.0)
ALT: 11 U/L (ref 0–44)
ANION GAP: 11 (ref 5–15)
AST: 15 U/L (ref 15–41)
Alkaline Phosphatase: 57 U/L (ref 38–126)
BUN: 7 mg/dL — AB (ref 8–23)
CO2: 23 mmol/L (ref 22–32)
Calcium: 8.6 mg/dL — ABNORMAL LOW (ref 8.9–10.3)
Chloride: 101 mmol/L (ref 98–111)
Creatinine, Ser: 0.41 mg/dL — ABNORMAL LOW (ref 0.44–1.00)
GFR calc Af Amer: >60 mL/min (ref >60)
GFR calc non Af Amer: >60 mL/min (ref >60)
GLUCOSE: 130 mg/dL — AB (ref 70–99)
Potassium: 3.3 mmol/L — ABNORMAL LOW (ref 3.5–5.1)
Sodium: 135 mmol/L (ref 135–145)
Total Bilirubin: 0.5 mg/dL (ref 0.3–1.2)
Total Protein: 8.2 g/dL — ABNORMAL HIGH (ref 6.5–8.1)

## 2018-08-14 LAB — CBC WITH DIFFERENTIAL/PLATELET
Abs Immature Granulocytes: 0.01 10*3/uL (ref 0.00–0.07)
Basophils Absolute: 0 10*3/uL (ref 0.0–0.1)
Basophils Relative: 0 %
Eosinophils Absolute: 0 10*3/uL (ref 0.0–0.5)
Eosinophils Relative: 1 %
HCT: 29 % — ABNORMAL LOW (ref 36.0–46.0)
Hemoglobin: 9 g/dL — ABNORMAL LOW (ref 12.0–15.0)
IMMATURE GRANULOCYTES: 0 %
Lymphocytes Relative: 25 %
Lymphs Abs: 1.2 10*3/uL (ref 0.7–4.0)
MCH: 25.4 pg — ABNORMAL LOW (ref 26.0–34.0)
MCHC: 31 g/dL (ref 30.0–36.0)
MCV: 81.7 fL (ref 80.0–100.0)
Monocytes Absolute: 0.5 10*3/uL (ref 0.1–1.0)
Monocytes Relative: 10 %
NEUTROS ABS: 3 10*3/uL (ref 1.7–7.7)
Neutrophils Relative %: 64 %
Platelets: 281 10*3/uL (ref 150–400)
RBC: 3.55 MIL/uL — ABNORMAL LOW (ref 3.87–5.11)
RDW: 23.5 % — ABNORMAL HIGH (ref 11.5–15.5)
WBC: 4.7 10*3/uL (ref 4.0–10.5)
nRBC: 0 % (ref 0.0–0.2)

## 2018-08-14 LAB — IRON AND TIBC
Iron: 23 ug/dL — ABNORMAL LOW (ref 28–170)
Saturation Ratios: 12 % (ref 10.4–31.8)
TIBC: 191 ug/dL — ABNORMAL LOW (ref 250–450)
UIBC: 168 ug/dL

## 2018-08-14 LAB — FERRITIN: Ferritin: 1114 ng/mL — ABNORMAL HIGH (ref 11–307)

## 2018-08-14 MED ORDER — SODIUM CHLORIDE 0.9 % IV SOLN
Freq: Once | INTRAVENOUS | Status: AC
  Administered 2018-08-14: 10:00:00 via INTRAVENOUS
  Filled 2018-08-14: qty 250

## 2018-08-14 MED ORDER — ALPRAZOLAM 0.25 MG PO TABS
0.2500 mg | ORAL_TABLET | Freq: Once | ORAL | Status: AC
  Administered 2018-08-14: 0.25 mg via ORAL
  Filled 2018-08-14: qty 1

## 2018-08-14 MED ORDER — FAMOTIDINE IN NACL 20-0.9 MG/50ML-% IV SOLN
20.0000 mg | Freq: Once | INTRAVENOUS | Status: AC
  Administered 2018-08-14: 20 mg via INTRAVENOUS
  Filled 2018-08-14: qty 50

## 2018-08-14 MED ORDER — SODIUM CHLORIDE 0.9 % IV SOLN
560.0000 mg | Freq: Once | INTRAVENOUS | Status: AC
  Administered 2018-08-14: 560 mg via INTRAVENOUS
  Filled 2018-08-14: qty 56

## 2018-08-14 MED ORDER — SODIUM CHLORIDE 0.9 % IV SOLN
155.0000 mg/m2 | Freq: Once | INTRAVENOUS | Status: AC
  Administered 2018-08-14: 300 mg via INTRAVENOUS
  Filled 2018-08-14: qty 50

## 2018-08-14 MED ORDER — DIPHENHYDRAMINE HCL 50 MG/ML IJ SOLN
25.0000 mg | Freq: Once | INTRAMUSCULAR | Status: AC
  Administered 2018-08-14: 25 mg via INTRAVENOUS
  Filled 2018-08-14: qty 1

## 2018-08-14 MED ORDER — SODIUM CHLORIDE 0.9 % IV SOLN: 641.5000 mg | Freq: Once | INTRAVENOUS | Status: DC

## 2018-08-14 MED ORDER — DEXAMETHASONE SODIUM PHOSPHATE 10 MG/ML IJ SOLN
10.0000 mg | Freq: Once | INTRAMUSCULAR | Status: AC
  Administered 2018-08-14: 10 mg via INTRAVENOUS
  Filled 2018-08-14: qty 1

## 2018-08-14 MED ORDER — PALONOSETRON HCL INJECTION 0.25 MG/5ML
0.2500 mg | Freq: Once | INTRAVENOUS | Status: AC
  Administered 2018-08-14: 0.25 mg via INTRAVENOUS
  Filled 2018-08-14: qty 5

## 2018-08-14 NOTE — Progress Notes (Signed)
Patient is here today to follow up on her Borderline epithelial neoplasm of ovary. Patient stated that yesterday she had upper abdominal pain that lasted about 3-4 hours after eating lunch. Patient took Tylenol and stated that it helped her some with the pain. Patient stated that this was the only time that she has had this pain.

## 2018-08-14 NOTE — Progress Notes (Signed)
Weight change since intial dosing of carbo.  Changed to reflect todays dose of carbo per MD

## 2018-08-15 ENCOUNTER — Ambulatory Visit: Payer: Self-pay | Admitting: Oncology

## 2018-08-15 ENCOUNTER — Other Ambulatory Visit: Payer: Self-pay

## 2018-08-15 LAB — CA 125: Cancer Antigen (CA) 125: 81.8 U/mL — ABNORMAL HIGH (ref 0.0–38.1)

## 2018-08-28 ENCOUNTER — Other Ambulatory Visit: Payer: Self-pay | Admitting: *Deleted

## 2018-08-28 MED ORDER — ALPRAZOLAM 0.25 MG PO TABS: 0.2500 mg | ORAL_TABLET | Freq: Two times a day (BID) | ORAL | 1 refills | Status: DC | PRN

## 2018-09-01 ENCOUNTER — Emergency Department: Payer: Self-pay

## 2018-09-01 ENCOUNTER — Other Ambulatory Visit: Payer: Self-pay

## 2018-09-01 ENCOUNTER — Encounter: Payer: Self-pay | Admitting: Emergency Medicine

## 2018-09-01 ENCOUNTER — Emergency Department
Admission: EM | Admit: 2018-09-01 | Discharge: 2018-09-01 | Disposition: A | Discharge status: Home / Self Care | Payer: Self-pay | Attending: Emergency Medicine | Admitting: Emergency Medicine

## 2018-09-01 DIAGNOSIS — Z79899 Other long term (current) drug therapy: Secondary | ICD-10-CM | POA: Insufficient documentation

## 2018-09-01 DIAGNOSIS — Z87891 Personal history of nicotine dependence: Secondary | ICD-10-CM | POA: Insufficient documentation

## 2018-09-01 DIAGNOSIS — C800 Disseminated malignant neoplasm, unspecified: Secondary | ICD-10-CM | POA: Insufficient documentation

## 2018-09-01 DIAGNOSIS — R1030 Lower abdominal pain, unspecified: Secondary | ICD-10-CM | POA: Insufficient documentation

## 2018-09-01 DIAGNOSIS — Z8542 Personal history of malignant neoplasm of other parts of uterus: Secondary | ICD-10-CM | POA: Insufficient documentation

## 2018-09-01 DIAGNOSIS — J45909 Unspecified asthma, uncomplicated: Secondary | ICD-10-CM | POA: Insufficient documentation

## 2018-09-01 DIAGNOSIS — R109 Unspecified abdominal pain: Secondary | ICD-10-CM

## 2018-09-01 LAB — CBC WITH DIFFERENTIAL/PLATELET
Abs Immature Granulocytes: 0.02 10*3/uL (ref 0.00–0.07)
Basophils Absolute: 0 10*3/uL (ref 0.0–0.1)
Basophils Relative: 0 %
Eosinophils Absolute: 0 10*3/uL (ref 0.0–0.5)
Eosinophils Relative: 0 %
HCT: 26.2 % — ABNORMAL LOW (ref 36.0–46.0)
Hemoglobin: 8.1 g/dL — ABNORMAL LOW (ref 12.0–15.0)
Immature Granulocytes: 0 %
Lymphocytes Relative: 23 %
Lymphs Abs: 1.1 10*3/uL (ref 0.7–4.0)
MCH: 25.5 pg — AB (ref 26.0–34.0)
MCHC: 30.9 g/dL (ref 30.0–36.0)
MCV: 82.4 fL (ref 80.0–100.0)
MONO ABS: 0.8 10*3/uL (ref 0.1–1.0)
Monocytes Relative: 17 %
Neutro Abs: 2.8 10*3/uL (ref 1.7–7.7)
Neutrophils Relative %: 60 %
Platelets: 336 10*3/uL (ref 150–400)
RBC: 3.18 MIL/uL — AB (ref 3.87–5.11)
RDW: 21.8 % — ABNORMAL HIGH (ref 11.5–15.5)
Smear Review: NORMAL
WBC: 4.7 10*3/uL (ref 4.0–10.5)
nRBC: 0 % (ref 0.0–0.2)

## 2018-09-01 LAB — COMPREHENSIVE METABOLIC PANEL
ALT: 14 U/L (ref 0–44)
AST: 19 U/L (ref 15–41)
Albumin: 2.9 g/dL — ABNORMAL LOW (ref 3.5–5.0)
Alkaline Phosphatase: 66 U/L (ref 38–126)
Anion gap: 11 (ref 5–15)
BUN: 8 mg/dL (ref 8–23)
CO2: 24 mmol/L (ref 22–32)
Calcium: 8.5 mg/dL — ABNORMAL LOW (ref 8.9–10.3)
Chloride: 97 mmol/L — ABNORMAL LOW (ref 98–111)
Creatinine, Ser: 0.51 mg/dL (ref 0.44–1.00)
GFR calc non Af Amer: >60 mL/min (ref >60)
Glucose, Bld: 120 mg/dL — ABNORMAL HIGH (ref 70–99)
Potassium: 4 mmol/L (ref 3.5–5.1)
Sodium: 132 mmol/L — ABNORMAL LOW (ref 135–145)
Total Bilirubin: 0.6 mg/dL (ref 0.3–1.2)
Total Protein: 8.4 g/dL — ABNORMAL HIGH (ref 6.5–8.1)

## 2018-09-01 LAB — LACTIC ACID, PLASMA: Lactic Acid, Venous: 0.9 mmol/L (ref 0.5–1.9)

## 2018-09-01 LAB — PROTIME-INR
INR: 1.3 — ABNORMAL HIGH (ref 0.8–1.2)
Prothrombin Time: 15.7 seconds — ABNORMAL HIGH (ref 11.4–15.2)

## 2018-09-01 MED ORDER — IOHEXOL 300 MG/ML  SOLN
100.0000 mL | Freq: Once | INTRAMUSCULAR | Status: AC | PRN
  Administered 2018-09-01: 100 mL via INTRAVENOUS

## 2018-09-01 MED ORDER — SODIUM CHLORIDE 0.9% FLUSH: 3.0000 mL | Freq: Once | INTRAVENOUS | Status: DC

## 2018-09-01 NOTE — Discharge Instructions (Addendum)
Please seek medical attention for any high fevers, chest pain, shortness of breath, change in behavior, persistent vomiting, bloody stool or any other new or concerning symptoms.  

## 2018-09-01 NOTE — ED Provider Notes (Signed)
[facility] Emergency Department Provider Note   ____________________________________________   I have reviewed the triage vital signs and the nursing notes.   HISTORY  Chief Complaint Abdominal Pain   History limited by: [facility] Interpreter utilized   HPI Elizabeth Davenport is a 63 y.o. female who presents to the emergency department today because of concerns for abdominal pain.  The patient states that the pain is located in her lower abdomen.  She describes it as pressure sensation.  Pain started yesterday.  It got worse this morning and throughout the day.  Eating makes the pain worse.  She has noticed a slight decrease in her bowel movements although has continued to pass gas.  She has had some decreased appetite but denies any vomiting.  She denies any fever with this pain.  She states she has had similar pain in the past when she has had too much fluid abdomen. Is currently undergoing chemotherapy, states last course was almost three weeks ago.    Per medical record review patient has a history of endometrial cancer.   Past Medical History:  Diagnosis Date  . Arthritis    left knee  . Asthma   . Endometrial cancer ([place]) 11/04/14  . PONV (postoperative nausea and vomiting)     Patient Active Problem List   Diagnosis Date Noted  . Refusal of blood transfusions as patient is Jehovah's Witness 11/30/2017  . 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 History of endometrial cancer 11/08/2016  . 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 Ovarian carcinoma, unspecified laterality ([place]) 11/25/2014  . Endometrial cancer ([place]) 11/04/2014  . S/P laparoscopic hysterectomy 11/04/2014    Past Surgical History:  Procedure Laterality Date  . ABDOMINAL HYSTERECTOMY    . LAPAROSCOPIC BILATERAL SALPINGO OOPHERECTOMY Bilateral 11/04/2014   Procedure: LAPAROSCOPIC BILATERAL SALPINGO OOPHORECTOMY;  Surgeon:  Mellody Drown, MD;  Location: [facility] ORS;  Service: Gynecology;  Laterality: Bilateral;  . LAPAROSCOPIC HYSTERECTOMY N/A 11/04/2014   Procedure: HYSTERECTOMY TOTAL LAPAROSCOPIC;  Surgeon: Mellody Drown, MD;  Location: [facility] ORS;  Service: Gynecology;  Laterality: N/A;  . removed mass from thyriod      Prior to Admission medications   Medication Sig Start Date End Date Taking? Authorizing Provider  acetaminophen (TYLENOL) 500 MG tablet Take 500 mg by mouth every 6 (six) hours as needed.    [provider]  albuterol (PROVENTIL HFA;VENTOLIN HFA) 108 (90 BASE) MCG/ACT inhaler Inhale 2 puffs into the lungs every 6 (six) hours as needed for wheezing or shortness of breath.    [provider]  ALPRAZolam Duanne Moron) 0.25 MG tablet Take 1 tablet (0.25 mg total) by mouth 2 (two) times daily as needed for anxiety. 08/28/18   Lloyd Huger, MD  exemestane (AROMASIN) 25 MG tablet Take 1 tablet (25 mg total) by mouth daily after breakfast. Patient not taking: Reported on 08/14/2018 04/11/18   Lloyd Huger, MD  fluticasone (FLOVENT HFA) 220 MCG/ACT inhaler Take by mouth.    [provider]  gabapentin (NEURONTIN) 300 MG capsule Take 1 capsule (300 mg total) by mouth 2 (two) times daily. Neuropathic pain 06/21/18   Verlon Au, NP  ondansetron (ZOFRAN) 8 MG tablet Take 1 tablet (8 mg total) by mouth 2 (two) times daily as needed for refractory nausea / vomiting. Patient not taking: Reported on 08/14/2018 01/11/18   Lloyd Huger, MD  pantoprazole (PROTONIX) 40 MG tablet Take 1 tablet (40 mg total) by  mouth daily. For acid reflux 06/21/18   Verlon Au, NP  polyethylene glycol [facility]) packet Take 17 g by mouth daily. 06/18/18   Jacquelin Hawking, NP  prochlorperazine (COMPAZINE) 10 MG tablet Take 1 tablet (10 mg total) by mouth every 6 (six) hours as needed (Nausea or vomiting). Patient not taking: Reported on 08/14/2018 01/11/18   Lloyd Huger, MD   senna-docusate (SENNA S) 8.6-50 MG tablet Take 1 tablet by mouth daily. Patient not taking: Reported on 08/14/2018 06/18/18   Jacquelin Hawking, NP  traMADol (ULTRAM) 50 MG tablet Take 1 tablet (50 mg total) by mouth every 6 (six) hours as needed for moderate pain or severe pain. Patient not taking: Reported on 08/14/2018 06/21/18   Verlon Au, NP    Allergies Patient has no known allergies.  Family History  Problem Relation Age of Onset  . Breast cancer Sister 59  . Asthma Mother   . Coronary artery disease Mother   . Diabetes Brother   . Diabetes Sister   . Diabetes Sister   . Diabetes Brother     Social History Social History   Tobacco Use  . Smoking status: Former Smoker    Packs/day: 0.25    Years: 8.00    Pack years: 2.00    Types: Cigarettes    Last attempt to quit: 06/29/1990    Years since quitting: 28.1  . Smokeless tobacco: Never Used  . Tobacco comment: Smoking History Cigarettes 1 cig per day; quit 22 years ago; smoked for 8 years  Substance Use Topics  . Alcohol use: No    Alcohol/week: 0.0 standard drinks    Comment: occasional   . Drug use: No    Review of Systems Constitutional: No fever/chills Eyes: No visual changes. ENT: No sore throat. Cardiovascular: Denies chest pain. Respiratory: Denies shortness of breath. Gastrointestinal: Positive for lower abdominal pain.  Genitourinary: Negative for dysuria. Musculoskeletal: Negative for back pain. Skin: Negative for rash. Neurological: Negative for headaches, focal weakness or numbness.  ____________________________________________   PHYSICAL EXAM:  VITAL SIGNS: ED Triage Vitals  Enc Vitals Group     BP 09/01/18 1727 106/70     Pulse Rate 09/01/18 1727 (!) 115     Resp 09/01/18 1727 20     Temp 09/01/18 1727 99.2 F (37.3 C)     Temp Source 09/01/18 1727 Oral     SpO2 09/01/18 1727 95 %     Weight 09/01/18 1730 168 lb (76.2 kg)     Height 09/01/18 1730 5\' 1"  (1.549 m)     Head  Circumference --      Peak Flow --      Pain Score 09/01/18 1728 5   Constitutional: Alert and oriented.  Eyes: Conjunctivae are normal.  ENT      Head: Normocephalic and atraumatic.      Nose: No congestion/rhinnorhea.      Mouth/Throat: Mucous membranes are moist.      Neck: No stridor. Hematological/Lymphatic/Immunilogical: No cervical lymphadenopathy. Cardiovascular: Normal rate, regular rhythm.  No murmurs, rubs, or gallops. Respiratory: Normal respiratory effort without tachypnea nor retractions. Breath sounds are clear and equal bilaterally. No wheezes/rales/rhonchi. Gastrointestinal: Soft and somewhat diffusely tender to palpation. No rebound. No guarding.  Genitourinary: Deferred Musculoskeletal: Normal range of motion in all extremities. No lower extremity edema. Neurologic:  Normal speech and language. No gross focal neurologic deficits are appreciated.  Skin:  Skin is warm, dry and intact. No rash noted. Psychiatric: Mood  and affect are normal. Speech and behavior are normal. Patient exhibits appropriate insight and judgment.  ____________________________________________    LABS (pertinent positives/negatives)  CBC wbc 4.7, hgb 8.1, plt 336 Lactic 0.9 CMP na 132, k 4.0, cl 97, glu 120, cr 0.51 INR 1.3 ____________________________________________   EKG  None  ____________________________________________    RADIOLOGY  CT abd/pel Free fluid consistent with car  CXR No acute disease ____________________________________________   PROCEDURES  Procedures  ____________________________________________   INITIAL IMPRESSION / ASSESSMENT AND PLAN / ED COURSE  Pertinent labs & imaging results that were available during my care of the patient were reviewed by me and considered in my medical decision making (see chart for details).   Patient presented to the emergency department today because of concerns for abdominal pain.  Patient states she has had similar  pain in the past when she has had fluid collections are seen.  Work-up was somewhat emergently concerning leukocytosis other findings suggestive of significant intra-abdominal infection.  During her stay in the emergency department she stated her pain felt poorly controlled and she had taken tramadol prior to arrival.  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  No other acute concerning findings.  I did discussion with the patient.  She has had this fluid drawn off in the past he states that is helped in the past.  Patient felt comfortable being discharged home with current pain control and contact hematologist tomorrow to potentially arrange further paracentesis.   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 Any transcriptional errors that result from this process are unintentional     Nance Pear, MD 09/01/18 2133

## 2018-09-01 NOTE — Progress Notes (Signed)
[place]  [phone]) [phone] [phone]) [phone]  ID: Elizabeth Davenport OB: 07-28-1955  MR#: [id number]  PHX#:[id number]  Patient Care Team: Letta Median, MD as PCP - General (Family Medicine) Clent Jacks, RN as Registered Nurse Rico Junker, RN as Registered Nurse Theodore Demark, RN as Registered Nurse  CHIEF COMPLAINT: Progressive low-grade ovarian malignancy  INTERVAL HISTORY: Patient returns to clinic today for further evaluation and consideration of cycle 5 of carboplatinum and Taxol.  She was in the ER earlier this week with abdominal pain and bloating.  She underwent CT scan which showed mildly progressive disease as well as increased ascites.  Paracentesis only was able to remove a minimal amount of fluid.  Her pain has improved.  She has chronic weakness and fatigue. She has no neurologic complaints.  She denies any fevers.  She has a fair appetite and denies weight loss.  She has no chest pain or shortness of breath.  She denies any nausea, vomiting, constipation, or diarrhea.  She has no urinary complaints.  Patient offers no further specific complaints today.  REVIEW OF SYSTEMS:   Review of Systems  Constitutional: Positive for malaise/fatigue. Negative for fever and weight loss.  Respiratory: Negative.  Negative for cough and shortness of breath.   Cardiovascular: Negative.  Negative for chest pain and leg swelling.  Gastrointestinal: Positive for abdominal pain. Negative for constipation, diarrhea, heartburn, nausea and vomiting.  Genitourinary: Negative.  Negative for dysuria.  Musculoskeletal: Negative.  Negative for back pain.  Skin: Negative.  Negative for rash.  Neurological: Positive for weakness. Negative for dizziness, sensory change, focal weakness and headaches.  Psychiatric/Behavioral: Negative.  The patient is not nervous/anxious.     As per HPI. Otherwise, a complete review of systems is negative.  PAST MEDICAL  HISTORY: Past Medical History:  Diagnosis Date  . Arthritis    left knee  . Asthma   . Endometrial cancer ([place]) 11/04/14  . PONV (postoperative nausea and vomiting)     PAST SURGICAL HISTORY: Past Surgical History:  Procedure Laterality Date  . ABDOMINAL HYSTERECTOMY    . LAPAROSCOPIC BILATERAL SALPINGO OOPHERECTOMY Bilateral 11/04/2014   Procedure: LAPAROSCOPIC BILATERAL SALPINGO OOPHORECTOMY;  Surgeon: Mellody Drown, MD;  Location: [facility] ORS;  Service: Gynecology;  Laterality: Bilateral;  . LAPAROSCOPIC HYSTERECTOMY N/A 11/04/2014   Procedure: HYSTERECTOMY TOTAL LAPAROSCOPIC;  Surgeon: Mellody Drown, MD;  Location: [facility] ORS;  Service: Gynecology;  Laterality: N/A;  . removed mass from thyriod      FAMILY HISTORY: Family History  Problem Relation Age of Onset  . Breast cancer Sister 41  . Asthma Mother   . Coronary artery disease Mother   . Diabetes Brother   . Diabetes Sister   . Diabetes Sister   . Diabetes Brother     ADVANCED DIRECTIVES (Y/N):  N  HEALTH MAINTENANCE: Social History   Tobacco Use  . Smoking status: Former Smoker    Packs/day: 0.25    Years: 8.00    Pack years: 2.00    Types: Cigarettes    Last attempt to quit: 06/29/1990    Years since quitting: 28.2  . Smokeless tobacco: Never Used  . Tobacco comment: Smoking History Cigarettes 1 cig per day; quit 22 years ago; smoked for 8 years  Substance Use Topics  . Alcohol use: No    Alcohol/week: 0.0 standard drinks    Comment: occasional   . Drug use: No     Colonoscopy:  PAP:  Bone density:  Lipid  panel:  No Known Allergies  Current Outpatient Medications  Medication Sig Dispense Refill  . acetaminophen (TYLENOL) 500 MG tablet Take 500 mg by mouth every 6 (six) hours as needed.    Marland Kitchen albuterol (PROVENTIL HFA;VENTOLIN HFA) 108 (90 BASE) MCG/ACT inhaler Inhale 2 puffs into the lungs every 6 (six) hours as needed for wheezing or shortness of breath.    . ALPRAZolam (XANAX) 0.25 MG tablet Take  1 tablet (0.25 mg total) by mouth 2 (two) times daily as needed for anxiety. 30 tablet 1  . dexamethasone (DECADRON) 4 MG tablet Take 1 tablet (4 mg total) by mouth daily for 5 days. 5 tablet 0  . fluticasone (FLOVENT HFA) 220 MCG/ACT inhaler Take by mouth.    . gabapentin (NEURONTIN) 300 MG capsule Take 1 capsule (300 mg total) by mouth 2 (two) times daily. Neuropathic pain 30 capsule 0  . metoCLOPramide (REGLAN) 10 MG tablet Take 0.5 tablets (5 mg total) by mouth 4 (four) times daily -  before meals and at bedtime. 60 tablet 1  . ondansetron (ZOFRAN) 8 MG tablet Take 1 tablet (8 mg total) by mouth 2 (two) times daily as needed for refractory nausea / vomiting. 30 tablet 2  . oxyCODONE (OXY IR/ROXICODONE) 5 MG immediate release tablet Take 0.5-1 tablets (2.5-5 mg total) by mouth every 4 (four) hours as needed for severe pain or breakthrough pain. 30 tablet 0  . pantoprazole (PROTONIX) 40 MG tablet Take 1 tablet (40 mg total) by mouth daily. For acid reflux 90 tablet 3  . polyethylene glycol (MIRALAX) packet Take 17 g by mouth daily. 14 each 0  . prochlorperazine (COMPAZINE) 10 MG tablet Take 1 tablet (10 mg total) by mouth every 6 (six) hours as needed (Nausea or vomiting). 60 tablet 2  . senna-docusate (SENNA S) 8.6-50 MG tablet Take 1 tablet by mouth daily. 30 tablet 0  . traMADol (ULTRAM) 50 MG tablet Take 1 tablet (50 mg total) by mouth every 6 (six) hours as needed for moderate pain or severe pain. 5 tablet 0  . exemestane (AROMASIN) 25 MG tablet Take 1 tablet (25 mg total) by mouth daily after breakfast. (Patient not taking: Reported on 08/14/2018) 30 tablet 3   No current facility-administered medications for this visit.     OBJECTIVE: Vitals:   09/04/18 0844 09/04/18 0850  BP:  106/77  Pulse:  (!) 103  Resp: 16   Temp:  (!) 97.3 F (36.3 C)     Body mass index is 29.8 kg/m.    ECOG FS:0 - Asymptomatic  General: Well-developed, well-nourished, no acute distress. Eyes: Pink  conjunctiva, anicteric sclera. HEENT: Normocephalic, moist mucous membranes, clear oropharnyx. Lungs: Clear to auscultation bilaterally. Heart: Regular rate and rhythm. No rubs, murmurs, or gallops. Abdomen: Soft, nondistended, mildly tender. Musculoskeletal: No edema, cyanosis, or clubbing. Neuro: Alert, answering all questions appropriately. Cranial nerves grossly intact. Skin: No rashes or petechiae noted. Psych: Normal affect.  LAB RESULTS:  Lab Results  Component Value Date   NA 131 (L) 09/04/2018   K 3.3 (L) 09/04/2018   CL 96 (L) 09/04/2018   CO2 22 09/04/2018   GLUCOSE 156 (H) 09/04/2018   BUN 9 09/04/2018   CREATININE 0.48 09/04/2018   CALCIUM 9.0 09/04/2018   PROT 9.0 (H) 09/04/2018   ALBUMIN 2.9 (L) 09/04/2018   AST 21 09/04/2018   ALT 17 09/04/2018   ALKPHOS 117 09/04/2018   BILITOT 0.8 09/04/2018   GFRNONAA >60 09/04/2018   GFRAA >60 09/04/2018  Lab Results  Component Value Date   WBC 7.8 09/04/2018   NEUTROABS 6.7 09/04/2018   HGB 8.8 (L) 09/04/2018   HCT 28.1 (L) 09/04/2018   MCV 82.2 09/04/2018   PLT 383 09/04/2018     STUDIES: Dg Chest 2 View  Result Date: 09/01/2018 CLINICAL DATA:  Suspected sepsis.  Pain. EXAM: CHEST - 2 VIEW COMPARISON:  Oct 28, 2014 FINDINGS: The heart size and mediastinal contours are within normal limits. Both lungs are clear. The visualized skeletal structures are unremarkable. IMPRESSION: No active cardiopulmonary disease. Electronically Signed   By: Dorise Bullion III M.D   On: 09/01/2018 18:45   Ct Abdomen Pelvis W Contrast  Result Date: 09/01/2018 CLINICAL DATA:  Bilateral lower abdominal pain, worse on the left. History of ovarian cancer on chemotherapy, last 2 weeks ago. Increased pain today. EXAM: CT ABDOMEN AND PELVIS WITH CONTRAST TECHNIQUE: Multidetector CT imaging of the abdomen and pelvis was performed using the standard protocol following bolus administration of intravenous contrast. CONTRAST:  174mL OMNIPAQUE  IOHEXOL 300 MG/ML  SOLN COMPARISON:  06/06/2018 FINDINGS: Lower chest: Lung bases are clear. Right cardiophrenic angle lymph nodes seen on previous study are decreased in size. Hepatobiliary: Several subcentimeter low-attenuation lesions in the liver without change since previous study, probably cysts. Gallbladder and bile ducts are unremarkable. Pancreas: Unremarkable. No pancreatic ductal dilatation or surrounding inflammatory changes. Spleen: Normal in size without focal abnormality. Adrenals/Urinary Tract: No adrenal gland nodules. Right renal cysts without change. Nephrograms are otherwise homogeneous and symmetrical. No hydronephrosis or hydroureter. Bladder wall is not thickened. No bladder filling defects. Stomach/Bowel: Stomach, small bowel, and colon are mostly decompressed. Scattered stool in the colon. No wall thickening or inflammatory changes identified. Vascular/Lymphatic: Scattered calcification of the aorta. No aneurysm. Scattered mesenteric and retroperitoneal lymph nodes are mildly prominent, similar to prior study. Iliac chain pelvic lymph nodes, most prominent on the left. These measure only about 9 mm in short axis dimension but appear to have enlarged since previous study. Possible to represent progression of metastatic disease. Reproductive: Status post hysterectomy. No adnexal masses. Other: Moderate free fluid in the abdomen and pelvis, similar to previous study. No free air. Nodular changes in the mesentery and omentum probably representing peritoneal implants. Musculoskeletal: No destructive bone lesions. IMPRESSION: 1. 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 Similar to previous study. 2. Mild prominence of mesenteric and retroperitoneal lymph nodes, similar to prior study. Increased prominence of pelvic lymph nodes, possibly representing progression of metastatic disease. Right  cardiophrenic angle nodes seen on previous study are decreased in size. 3. No evidence of bowel obstruction or inflammation. Electronically Signed   By: Lucienne Capers M.D.   On: 09/01/2018 20:06   US Paracentesis  Result Date: 09/03/2018 INDICATION: Patient with history of endometrial and ovarian cancers, recurrent malignant ascites. Request made for diagnostic and therapeutic paracentesis. EXAM: ULTRASOUND GUIDED DIAGNOSTIC AND THERAPEUTIC PARACENTESIS MEDICATIONS: None COMPLICATIONS: None immediate. PROCEDURE: Informed written consent was obtained from the patient after a discussion of the risks, benefits and alternatives to treatment. A timeout was performed prior to the initiation of the procedure. Initial ultrasound scanning demonstrates a small amount of ascites within the right mid to lower abdominal quadrant. The right mid to lower abdomen was prepped and draped in the usual sterile fashion. 1% lidocaine was used for local anesthesia. Following this, a 19 gauge, 10-cm, Yueh catheter was introduced. An ultrasound image was saved for  documentation purposes. The paracentesis was performed. The catheter was removed and a dressing was applied. The patient tolerated the procedure well without immediate post procedural complication. FINDINGS: A total of approximately 360 cc of slightly hazy, yellow fluid was removed. Samples were sent to the laboratory as requested by the clinical team. IMPRESSION: Successful ultrasound-guided diagnostic and therapeutic paracentesis yielding 360 cc of peritoneal fluid. Read by: Rowe Robert, PA-C Electronically Signed   By: Sandi Mariscal M.D.   On: 09/03/2018 10:55    ASSESSMENT: Progressive low-grade ovarian malignancy. PLAN:   1.  Progressive low-grade ovarian malignancy: CT scan results from September 01, 2018 reviewed independently and report as above with mild prominence of mesenteric and retroperitoneal lymph nodes unchanged as well as increased prominence of pelvic  lymph nodes possibly representing progression of disease.  Her CA-125 is increased, but stable.  At worst, CT scan is equivocal therefore will continue on with current treatment.  Previously, patient underwent optimal surgical debulking and then completed 3 cycles of adjuvant carboplatinum and Taxol on February 26, 2018.  Patient was initially placed on maintenance letrozole, but could not tolerate treatment and this was switched to Aromasin.  Proceed with cycle 5 of carboplatinum and Taxol today.  Return to clinic in 3 weeks for further evaluation and consideration of cycle 6.  Plan to reimage again at the conclusion of cycle 6.         2.  History of endometrial cancer: Patient had total hysterectomy.  Imaging as above. 3.  Anemia: Patient's hemoglobin has improved to 8.8.  Proceed with IV Feraheme today.   4.  Leukopenia: Resolved.   5.  Anxiety: Continue Xanax as needed.  Patient was given a refill today. 6.  Indigestion: Continue Protonix as prescribed. 7.  Elevated ferritin: Likely secondary to acute phase reactant secondary to malignancy.  Monitor. 8.  Disposition: Hospice and end-of-life care were briefly discussed again today.  Appreciate palliative care input.  We will proceed with 2 additional treatments as above and then readdress after imaging.  The entire visit was done in the presence of an interpreter.  Patient expressed understanding and was in agreement with this plan. She also understands that She can call clinic at any time with any questions, concerns, or complaints.   Cancer Staging Borderline epithelial neoplasm of ovary Staging form: Ovary, Fallopian Tube, and Primary Peritoneal Carcinoma, AJCC 8th Edition - Clinical stage from 01/11/2018: FIGO Stage III (cT3, cN0, cM0) - Signed by Lloyd Huger, MD on 01/11/2018  Endometrial cancer [facility]) Staging form: Corpus Uteri - Carcinoma, AJCC 7th Edition - Clinical: Stage I (T1, N0, M0) - Unsigned  Ovarian carcinoma,  unspecified laterality ([place]) Staging form: Ovary, AJCC 7th Edition - Clinical: Stage Unknown (T1c, NX, M0) - Signed by Forest Gleason, MD on 11/27/2014   Lloyd Huger, MD   09/06/2018 10:03 AM

## 2018-09-01 NOTE — ED Triage Notes (Signed)
Pt to ED via POV c/o bilateral lower abdominal pain. Pt states that pain is worse on the left than right, right side is more of a pressure. Pt has hx/o ovarian cancer and takes chemo, last treatment was 2 weeks ago, pt is scheduled for another treatment on Wednesday. Per family pt has had fluid build up before and she has had to have fluid drained, this last happened about 5 weeks ago. Pt has been having pain for a few days but the pain increased today. Pt is in NAD.

## 2018-09-02 ENCOUNTER — Inpatient Hospital Stay: Payer: Self-pay | Attending: Nurse Practitioner | Admitting: Nurse Practitioner

## 2018-09-02 ENCOUNTER — Inpatient Hospital Stay: Payer: Self-pay

## 2018-09-02 ENCOUNTER — Other Ambulatory Visit: Payer: Self-pay

## 2018-09-02 ENCOUNTER — Telehealth: Payer: Self-pay | Admitting: *Deleted

## 2018-09-02 ENCOUNTER — Other Ambulatory Visit: Payer: Self-pay | Admitting: *Deleted

## 2018-09-02 VITALS — BP 107/75 | HR 114 | Temp 99.3°F | Resp 16 | Wt 160.0 lb

## 2018-09-02 DIAGNOSIS — Z515 Encounter for palliative care: Secondary | ICD-10-CM | POA: Insufficient documentation

## 2018-09-02 DIAGNOSIS — K30 Functional dyspepsia: Secondary | ICD-10-CM | POA: Insufficient documentation

## 2018-09-02 DIAGNOSIS — D3910 Neoplasm of uncertain behavior of unspecified ovary: Secondary | ICD-10-CM

## 2018-09-02 DIAGNOSIS — C786 Secondary malignant neoplasm of retroperitoneum and peritoneum: Secondary | ICD-10-CM | POA: Insufficient documentation

## 2018-09-02 DIAGNOSIS — R180 Malignant ascites: Secondary | ICD-10-CM | POA: Insufficient documentation

## 2018-09-02 DIAGNOSIS — R7989 Other specified abnormal findings of blood chemistry: Secondary | ICD-10-CM | POA: Insufficient documentation

## 2018-09-02 DIAGNOSIS — N281 Cyst of kidney, acquired: Secondary | ICD-10-CM | POA: Insufficient documentation

## 2018-09-02 DIAGNOSIS — Z90722 Acquired absence of ovaries, bilateral: Secondary | ICD-10-CM | POA: Insufficient documentation

## 2018-09-02 DIAGNOSIS — Z90710 Acquired absence of both cervix and uterus: Secondary | ICD-10-CM | POA: Insufficient documentation

## 2018-09-02 DIAGNOSIS — Z79811 Long term (current) use of aromatase inhibitors: Secondary | ICD-10-CM | POA: Insufficient documentation

## 2018-09-02 DIAGNOSIS — Z7189 Other specified counseling: Secondary | ICD-10-CM

## 2018-09-02 DIAGNOSIS — G893 Neoplasm related pain (acute) (chronic): Secondary | ICD-10-CM | POA: Insufficient documentation

## 2018-09-02 DIAGNOSIS — Z8542 Personal history of malignant neoplasm of other parts of uterus: Secondary | ICD-10-CM | POA: Insufficient documentation

## 2018-09-02 DIAGNOSIS — Z5111 Encounter for antineoplastic chemotherapy: Secondary | ICD-10-CM | POA: Insufficient documentation

## 2018-09-02 DIAGNOSIS — Z79899 Other long term (current) drug therapy: Secondary | ICD-10-CM | POA: Insufficient documentation

## 2018-09-02 DIAGNOSIS — F419 Anxiety disorder, unspecified: Secondary | ICD-10-CM | POA: Insufficient documentation

## 2018-09-02 DIAGNOSIS — R109 Unspecified abdominal pain: Secondary | ICD-10-CM

## 2018-09-02 DIAGNOSIS — C569 Malignant neoplasm of unspecified ovary: Secondary | ICD-10-CM | POA: Insufficient documentation

## 2018-09-02 DIAGNOSIS — R634 Abnormal weight loss: Secondary | ICD-10-CM | POA: Insufficient documentation

## 2018-09-02 DIAGNOSIS — D649 Anemia, unspecified: Secondary | ICD-10-CM | POA: Insufficient documentation

## 2018-09-02 DIAGNOSIS — Z87891 Personal history of nicotine dependence: Secondary | ICD-10-CM | POA: Insufficient documentation

## 2018-09-02 MED ORDER — METOCLOPRAMIDE HCL 10 MG PO TABS: 5.0000 mg | ORAL_TABLET | Freq: Three times a day (TID) | ORAL | 1 refills | Status: DC

## 2018-09-02 MED ORDER — DEXAMETHASONE 4 MG PO TABS: 4.0000 mg | ORAL_TABLET | Freq: Every day | ORAL | 0 refills | Status: AC

## 2018-09-02 MED ORDER — OXYCODONE HCL 5 MG PO TABS: 2.5000 mg | ORAL_TABLET | ORAL | 0 refills | Status: DC | PRN

## 2018-09-02 NOTE — Telephone Encounter (Signed)
Appointment for [facility] accepted for [phone]

## 2018-09-02 NOTE — Telephone Encounter (Signed)
No idea, but can she come and be seen in [facility]?  I also cancel a 6hr blood today, so we should have a chair.  Thanks!

## 2018-09-02 NOTE — Patient Instructions (Addendum)
You have a ultrasound guided paracentisis scheduled for tomorrow, September 03, 2018, at 9:30 [facility]. Please arrive at 9:00 to the [facility] and check in at the Registration Desk. You make take your regular medications and eat and drink as usual.

## 2018-09-02 NOTE — Progress Notes (Signed)
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  Reports menopause in 2014.  Dr. Enzo Bi did a Pap that was normal.  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  Focally ER PR negative.  CT scan on 09/22/2014 abdomen/pelvis negative for metastatic disease.  She was referred to gynecologic and exam of cervix was normal.  Slides were reviewed at [facility] and read as probable endometrial primary.  She underwent TLH-BSO on 11/04/2014 at [facility].  Right ovarian excresence noted at surgery.  Survey of the rest of the abdomen, including omentum, and right diaphragm was negative.  Further staging was not performed due to patient's refusal to accept blood products that she is a Jehovah's Witness.  Pathology at [facility] showed a 2.6 cm grade 1 endometrial cancer without LVSI or cervical involvement.  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  No spread noted but washings were positive.  In view of the path report, suggestive of two primaries, pathology was reviewed at [facility].  The review confirmed that the uterine tumor is in early grade 1 endometrial adenocarcinoma.  However the ovarian excresences (were read as low-grade serous carcinoma) were interpreted as serous borderline tumors by [facility]YN pathologists  (multiple).  The recommendation was for surveillance.  CA 125 05/05/2015 8.1 11/03/2015 12.7 05/10/2016 16.2 11/08/2016 15.4 05/23/2017 30.6 07/09/2017 25.2  On 10/31/2017 she presented to [facility] complaining of left lower quadrant abdominal pain which showed interval development of omental and peritoneal implants/metastatic disease and small amount of ascites with multiple small nodularities in the left upper quadrant including the left hemidiaphragm consistent with peritoneal implants.  Mildly rounded nodular density/lymph nodes at the cardiophrenic angles new and concerning for metastatic disease.  CT-guided biopsy performed. Pathology: A.  Omentum, left anterior abdomen-positive for involvement by epithelial neoplasm of gynecologic origin  11/14/2017 204.4  12/13/2017-underwent diagnostic laparoscopy, omentectomy, optimally debulked, for removal of mass.  Pathology-low-grade serous carcinoma in background of implants of serous borderline tumor.  Positive cytology ' suspicious for malignancy'.  Recommended to start chemotherapy followed by hormonal therapy.    01/22/2018 322.0 02/05/2018 314.0 02/26/2018 75.7  She completed 3 cycles of adjuvant carboplatin-Taxol on 02/26/2018.  Was started on letrozole, but on 04/01/2018 call the clinic and refused to take more due to bone and low back pain.  Plan was to washout and start anastrozole.  Trametinib was discussed if progressive disease.   03/19/2018 22.1  Presented to ER on 04/05/2018 complaining of low back pain and urinary frequency.  She was treated for possible UTI.  CT scan showed diffuse nodular infiltration throughout the mesentery and omentum consistent with peritoneal carcinomatosis. Started anastrozole.   04/10/2018 93.8 06/03/2018 134.0  Seen in symptom management clinic for abdominal swelling and discomfort.  Underwent paracentesis which yielded 1.9 L of fluid on 06/04/2018.Marland Kitchen  Fluid positive for malignancy.  CT chest/abdomen/pelvis  on  06/06/2018 showed progression.  CT chest/abdomen/pelvis scan 06/06/2018 showed progression.  Patient required paracentesis.  Fluid positive for malignant cells.     Ovarian carcinoma, unspecified laterality ([place])   11/25/2014 Initial Diagnosis    Ovarian cancer, bilateral   She reinitiated adjuvant carboplatin-Taxol on 06/14/2018. Treatment complicated by weakness and anemia.  Interval history- Elizabeth Davenport, 63 year old female with above history of endometrial cancer and progressive serous borderline ovarian cancer who presents to symptom management clinic for abdominal pain. She states this pain has been going on for the last 2-3 days. She describes it as aching pain that occurs intermittently. She has taken very limited amounts of pain medication but says medication helps when she takes it. She says she does not like taking medication. She continues to have regular bowel movements but feels less volume than previously. She has rectal pressure that does not change with bowel movement. She says that she feels she needs to have fluid removed again as this has helped in the past.   She was seen in emergency room on 09/01/2018 for similar abdominal pain. 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  Enlarged lymph nodes were similar to prior.  Increased prominence of pelvic lymph nodes possibly representing progression of metastatic disease.  Right cardiophrenic angle nodes are decreased.  No evidence of obstruction or inflammation.  Ca125 is been followed and has been decreasing since restarting carbo-Taxol.  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  ECOG FS:2 - Symptomatic, <50% confined to bed  Review of systems- Review of Systems  Constitutional: Positive for malaise/fatigue. Negative for chills, diaphoresis, fever and  weight loss.  HENT: Negative for congestion and sore throat.   Respiratory: Negative for cough, sputum production, shortness of breath and wheezing.   Cardiovascular: Negative for chest pain, claudication and leg swelling.  Gastrointestinal: Positive for abdominal pain, heartburn and nausea. Negative for blood in stool, constipation, diarrhea, melena and vomiting.  Genitourinary: Negative for dysuria, flank pain, frequency, hematuria and urgency.  Musculoskeletal: Negative for back pain, falls, joint pain, myalgias and neck pain.  Skin: Negative for itching and rash.  Neurological: Negative for dizziness, weakness and headaches.  Psychiatric/Behavioral: Negative for depression. The patient is not nervous/anxious and does not have insomnia.      Current treatment- carbo-taxol - most recent on 08/14/2018  No Known Allergies  Past Medical History:  Diagnosis Date  . Arthritis    left knee  . Asthma   . Endometrial cancer ([place]) 11/04/14  . PONV (postoperative nausea and vomiting)     Past Surgical History:  Procedure Laterality Date  . ABDOMINAL HYSTERECTOMY    . LAPAROSCOPIC BILATERAL SALPINGO OOPHERECTOMY Bilateral 11/04/2014   Procedure: LAPAROSCOPIC BILATERAL SALPINGO OOPHORECTOMY;  Surgeon: Mellody Drown, MD;  Location: [facility] ORS;  Service: Gynecology;  Laterality: Bilateral;  . LAPAROSCOPIC HYSTERECTOMY N/A 11/04/2014   Procedure: HYSTERECTOMY TOTAL LAPAROSCOPIC;  Surgeon: Mellody Drown, MD;  Location: [facility] ORS;  Service: Gynecology;  Laterality: N/A;  . removed mass from thyriod      Social History   Socioeconomic History  . Marital status: Married    Spouse name: Not on file  . Number of children: Not on file  . Years of education: Not on file  . Highest education level: Not on file  Occupational History  . Not on file  Social Needs  . Financial resource strain: Not on file  . Food insecurity:    Worry: Not on file    Inability: Not on file  . Transportation  needs:    Medical: Not on file    Non-medical: Not on file  Tobacco Use  . Smoking status: Former Smoker    Packs/day: 0.25    Years: 8.00    Pack years: 2.00    Types: Cigarettes    Last attempt to quit: 06/29/1990    Years since quitting: 28.1  . Smokeless tobacco: Never Used  . Tobacco comment: Smoking History Cigarettes 1 cig per day; quit 22 years ago; smoked for 8 years  Substance and Sexual Activity  . Alcohol use: No    Alcohol/week: 0.0 standard drinks    Comment: occasional   . Drug use: No  . Sexual activity: Yes    Partners: Male  Lifestyle  . Physical activity:    Days per week: Not on file    Minutes per session: Not on file  . Stress: Not on file  Relationships  . Social connections:    Talks on phone: Not on file    Gets together: Not on file    Attends religious service: Not on file    Active member of club or organization: Not on file    Attends meetings of clubs or organizations: Not on file    Relationship status: Not on file  . Intimate partner violence:    Fear of current or ex partner: Not on file    Emotionally abused: Not on file    Physically abused: Not on file    Forced sexual activity: Not on file  Other Topics Concern  . Not on file  Social History Narrative  . Not on file    Family History  Problem Relation Age of Onset  . Breast cancer Sister 65  . Asthma Mother   . Coronary artery disease Mother   . Diabetes Brother   . Diabetes Sister   . Diabetes Sister   . Diabetes Brother      Current Outpatient Medications:  .  acetaminophen (TYLENOL) 500 MG tablet, Take 500 mg by mouth every 6 (six) hours as needed., Disp: , Rfl:  .  albuterol (PROVENTIL HFA;VENTOLIN HFA) 108 (90 BASE) MCG/ACT inhaler, Inhale 2 puffs into the lungs every 6 (six) hours as needed for wheezing or shortness of breath., Disp: , Rfl:  .  ALPRAZolam (XANAX) 0.25 MG tablet, Take 1 tablet (0.25 mg total) by mouth 2 (two) times daily as needed for anxiety., Disp:  30 tablet, Rfl: 1 .  exemestane (AROMASIN) 25 MG tablet, Take 1 tablet (25 mg total) by mouth daily after breakfast. (Patient not taking: Reported on 08/14/2018), Disp: 30 tablet, Rfl: 3 .  fluticasone (FLOVENT HFA) 220 MCG/ACT inhaler, Take by mouth., Disp: , Rfl:  .  gabapentin (NEURONTIN) 300 MG capsule, Take 1 capsule (300 mg total) by mouth 2 (two) times daily. Neuropathic pain, Disp: 30 capsule, Rfl: 0 .  ondansetron (ZOFRAN) 8 MG tablet, Take 1 tablet (8 mg total) by mouth 2 (two) times daily as needed for refractory nausea / vomiting. (Patient not taking: Reported on 08/14/2018), Disp: 30 tablet, Rfl: 2 .  pantoprazole (PROTONIX) 40 MG tablet, Take 1 tablet (40 mg total) by mouth daily. For acid reflux, Disp: 90 tablet, Rfl: 3 .  polyethylene glycol (MIRALAX) packet, Take 17 g by mouth daily., Disp: 14 each, Rfl: 0 .  prochlorperazine (COMPAZINE) 10 MG tablet, Take 1 tablet (10 mg total) by mouth every 6 (six) hours as needed (Nausea or vomiting). (Patient not taking: Reported on 08/14/2018), Disp: 60 tablet, Rfl: 2 .  senna-docusate (SENNA S) 8.6-50 MG tablet, Take 1 tablet by mouth daily. (Patient not taking: Reported on 08/14/2018), Disp: 30 tablet, Rfl: 0 .  traMADol (ULTRAM) 50 MG tablet, Take 1 tablet (50 mg total) by mouth every 6 (six) hours as needed for moderate pain or severe pain. (Patient not taking: Reported on 08/14/2018), Disp: 5 tablet, Rfl: 0  Physical exam:  Vitals:   09/02/18 1058  BP: 107/75  Pulse: (!) 114  Resp: 16  Temp: 99.3 F (37.4 C)  TempSrc: Tympanic  SpO2: 96%  Weight: 160 lb (72.6 kg)   Physical Exam Constitutional:      General: She is not in acute distress.    Appearance: She is well-developed. She is not ill-appearing.  HENT:     Head: Normocephalic and atraumatic.     Mouth/Throat:     Mouth: Mucous membranes are moist.     Pharynx: Oropharynx is clear.  Eyes:     General: No scleral icterus. Cardiovascular:     Rate and Rhythm: Regular  rhythm. Tachycardia present.  Pulmonary:     Effort: Pulmonary effort is normal. No respiratory distress.     Breath sounds: Normal breath sounds.  Abdominal:     General: Bowel sounds are normal.     Palpations: Abdomen is soft.     Tenderness: There is abdominal tenderness (diffusely tender; no rebound. ).     Comments: Rounded abdomen.   Skin:    General: Skin is warm and dry.  Neurological:     Mental Status: She is alert and oriented to person, place, and time.  Psychiatric:        Mood and Affect: Mood is anxious.      CMP Latest Ref Rng & Units 09/01/2018  Glucose 70 - 99 mg/dL 120(H)  BUN 8 - 23 mg/dL 8  Creatinine 0.44 - 1.00 mg/dL 0.51  Sodium 135 - 145 mmol/L 132(L)  Potassium 3.5 - 5.1 mmol/L 4.0  Chloride 98 - 111 mmol/L 97(L)  CO2 22 - 32 mmol/L 24  Calcium 8.9 - 10.3 mg/dL 8.5(L)  Total Protein 6.5 - 8.1 g/dL 8.4(H)  Total Bilirubin 0.3 - 1.2 mg/dL 0.6  Alkaline Phos 38 - 126 U/L 66  AST 15 - 41 U/L 19  ALT 0 - 44 U/L 14   CBC Latest Ref Rng & Units 09/01/2018  WBC 4.0 - 10.5 K/uL 4.7  Hemoglobin 12.0 - 15.0 g/dL 8.1(L)  Hematocrit 36.0 - 46.0 % 26.2(L)  Platelets 150 - 400 K/uL 336    No images are attached to the encounter.  Dg Chest 2 View  Result Date: 09/01/2018 CLINICAL DATA:  Suspected sepsis.  Pain. EXAM: CHEST - 2 VIEW COMPARISON:  Oct 28, 2014 FINDINGS: The heart size and mediastinal contours are within normal limits. Both lungs are clear. The visualized skeletal structures are unremarkable. IMPRESSION: No active cardiopulmonary disease. Electronically Signed   By: Dorise Bullion III M.D   On: 09/01/2018 18:45   Ct Abdomen Pelvis W Contrast  Result Date: 09/01/2018 CLINICAL DATA:  Bilateral lower abdominal pain, worse on the left. History of ovarian cancer on chemotherapy, last 2 weeks ago. Increased pain today. EXAM: CT ABDOMEN AND PELVIS WITH CONTRAST TECHNIQUE: Multidetector CT imaging of the abdomen and pelvis was performed using the standard  protocol following bolus administration of intravenous contrast. CONTRAST:  183mL OMNIPAQUE IOHEXOL 300 MG/ML  SOLN COMPARISON:  06/06/2018 FINDINGS: Lower chest: Lung bases are clear. Right cardiophrenic angle lymph nodes seen on previous study are decreased in size. Hepatobiliary: Several subcentimeter low-attenuation lesions in the liver without change since previous study, probably cysts. Gallbladder and bile ducts are unremarkable. Pancreas: Unremarkable. No pancreatic ductal dilatation or surrounding inflammatory changes. Spleen: Normal in size without focal abnormality. Adrenals/Urinary Tract: No adrenal gland nodules. Right renal cysts without change. Nephrograms are otherwise homogeneous and symmetrical. No hydronephrosis or hydroureter. Bladder wall is not thickened. No bladder filling defects. Stomach/Bowel: Stomach, small bowel, and colon are mostly decompressed. Scattered stool in the colon. No wall thickening or inflammatory changes identified. Vascular/Lymphatic: Scattered calcification of the aorta. No aneurysm. Scattered mesenteric and retroperitoneal lymph nodes are mildly prominent, similar to prior study. Iliac chain pelvic lymph nodes, most prominent on the left. These measure only about 9 mm in short axis dimension but appear to have enlarged since previous study. Possible to represent progression of metastatic disease. Reproductive: Status post hysterectomy. No adnexal masses. Other: Moderate free fluid in the abdomen and pelvis, similar to previous study. No free air. Nodular changes in the mesentery and omentum probably representing peritoneal implants. Musculoskeletal: No destructive bone lesions. IMPRESSION: 1. 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 Similar to previous study. 2. Mild prominence of mesenteric and retroperitoneal lymph nodes, similar to prior study. Increased  prominence of pelvic lymph nodes, possibly representing progression of metastatic disease. Right cardiophrenic angle nodes seen on previous study are decreased in size. 3. No evidence of bowel obstruction or inflammation. Electronically Signed   By: Lucienne Capers M.D.   On: 09/01/2018 20:06    Assessment and plan- Patient is a 63 y.o. female diagnosed with recurrent low-grade serous ovarian cancer who presents to symptom management clinic for abdominal pain.  1.  Recurrent low-grade serous ovarian cancer-patient underwent optimal surgical debulking followed by 3 cycles of adjuvant carbo-Taxol on 02/26/2018 followed by maintenance Aromasin.  CA-125 was 22.1 at that time. She developed worsening symptoms and rising tumormarker consistent with progression and restarted chemotherapy with carbo-Taxol on 06/14/2018 given overall response previously. Symptoms today concerning for progression but CA125 overall consistent with prior though overall elevated. CT imaging from ER visit reviewed and discussed with Dr. Grayland Ormond who feels overall equivocal/stable and recommends continuing chemotherapy at this time. He will see patient on 09/04/2018 for consideration of cycle 5 of carbo-taxol.   2. Abdominal Pain- symptoms concerning for malignant bowel obstruction or pain secondary to her malignancy. Based on NCCN guidelines will start prokinetic, metoclopramide and add steroids. Would limit opioid doses as much as possible but suspect patient will continue to have pain in the future. Therefore, start oxycodone 2.5 mg-5 mg. Could consider adding octreotide in the future though I suspect may be cost prohibitive. Encouraged patient to maintain soft bowel movements and to avoid constipation as this could result in bowel obstruction. Dietary measures discussed and medication options.  Will also set up for paracentesis for tomorrow.    3. Goals of Care- patient is interested in continuing care at this time but was unable to  verbalize extent of her disease or treatment goals. We discussed the incurable nature of her disease and discussed where the cancer was located. Patient and daughter asked many questions which were answered in detail as completely as capable. We discussed  the importance of advanced care planning and she is agreeable to meeting with our palliative care provider. I also feel that she would benefit from ongoing management of MBO symptoms and malignancy associated pain which could be done in [facility].   Paracentesis on 3/10. RTC on 3/11 for Dr. Grayland Ormond and Palliative Care.   Due to language barrier, an interpreter was utilized for all patient interactions.   Visit Diagnosis 1. Borderline epithelial neoplasm of ovary   2. Pain of metastatic malignancy   3. Goals of care, counseling/discussion     Patient expressed understanding and was in agreement with this plan. She also understands that She can call clinic at any time with any questions, concerns, or complaints.   Thank you for allowing me to participate in the care of this very pleasant patient.   Beckey Rutter, DNP, AGNP-C [place] [facility] [place] (work cell) [phone] (office)   CC: Dr. Grayland Ormond

## 2018-09-02 NOTE — Telephone Encounter (Signed)
Patient seen in ER last night and needs a paracentesis which could not be done last night, Also requesting she come in for IV fluids. Can both be done in specials? Please advise

## 2018-09-03 ENCOUNTER — Other Ambulatory Visit: Payer: Self-pay

## 2018-09-03 ENCOUNTER — Ambulatory Visit
Admission: RE | Admit: 2018-09-03 | Discharge: 2018-09-03 | Disposition: A | Discharge status: Home / Self Care | Payer: Self-pay | Source: Ambulatory Visit | Attending: Nurse Practitioner | Admitting: Nurse Practitioner

## 2018-09-03 DIAGNOSIS — R180 Malignant ascites: Secondary | ICD-10-CM | POA: Insufficient documentation

## 2018-09-03 DIAGNOSIS — R109 Unspecified abdominal pain: Secondary | ICD-10-CM | POA: Insufficient documentation

## 2018-09-03 LAB — CA 125: Cancer Antigen (CA) 125: 82.9 U/mL — ABNORMAL HIGH (ref 0.0–38.1)

## 2018-09-03 NOTE — Procedures (Signed)
Ultrasound-guided diagnostic and therapeutic paracentesis performed yielding 360 cc of slightly hazy, yellow fluid. No immediate complications. The fluid was sent to the lab for cytology. EBL none. Minimal ascites present on today's [place].

## 2018-09-04 ENCOUNTER — Inpatient Hospital Stay (HOSPITAL_BASED_OUTPATIENT_CLINIC_OR_DEPARTMENT_OTHER): Payer: Self-pay | Admitting: Oncology

## 2018-09-04 ENCOUNTER — Inpatient Hospital Stay: Payer: Self-pay

## 2018-09-04 ENCOUNTER — Other Ambulatory Visit: Payer: Self-pay

## 2018-09-04 ENCOUNTER — Inpatient Hospital Stay: Payer: Self-pay | Admitting: Occupational Therapy

## 2018-09-04 ENCOUNTER — Encounter: Payer: Self-pay | Admitting: Oncology

## 2018-09-04 ENCOUNTER — Inpatient Hospital Stay (HOSPITAL_BASED_OUTPATIENT_CLINIC_OR_DEPARTMENT_OTHER): Payer: Self-pay | Admitting: Hospice and Palliative Medicine

## 2018-09-04 VITALS — BP 104/70 | HR 94 | Resp 18

## 2018-09-04 VITALS — BP 106/77 | HR 103 | Temp 97.3°F | Resp 16 | Ht 61.0 in | Wt 157.7 lb

## 2018-09-04 DIAGNOSIS — M79661 Pain in right lower leg: Secondary | ICD-10-CM

## 2018-09-04 DIAGNOSIS — K30 Functional dyspepsia: Secondary | ICD-10-CM

## 2018-09-04 DIAGNOSIS — K5900 Constipation, unspecified: Secondary | ICD-10-CM

## 2018-09-04 DIAGNOSIS — D3910 Neoplasm of uncertain behavior of unspecified ovary: Secondary | ICD-10-CM

## 2018-09-04 DIAGNOSIS — Z90710 Acquired absence of both cervix and uterus: Secondary | ICD-10-CM

## 2018-09-04 DIAGNOSIS — Z90722 Acquired absence of ovaries, bilateral: Secondary | ICD-10-CM

## 2018-09-04 DIAGNOSIS — Z79899 Other long term (current) drug therapy: Secondary | ICD-10-CM

## 2018-09-04 DIAGNOSIS — N281 Cyst of kidney, acquired: Secondary | ICD-10-CM

## 2018-09-04 DIAGNOSIS — Z515 Encounter for palliative care: Secondary | ICD-10-CM

## 2018-09-04 DIAGNOSIS — M79662 Pain in left lower leg: Principal | ICD-10-CM

## 2018-09-04 DIAGNOSIS — C569 Malignant neoplasm of unspecified ovary: Secondary | ICD-10-CM

## 2018-09-04 DIAGNOSIS — Z5111 Encounter for antineoplastic chemotherapy: Secondary | ICD-10-CM

## 2018-09-04 DIAGNOSIS — Z79811 Long term (current) use of aromatase inhibitors: Secondary | ICD-10-CM

## 2018-09-04 DIAGNOSIS — Z7189 Other specified counseling: Secondary | ICD-10-CM

## 2018-09-04 DIAGNOSIS — D649 Anemia, unspecified: Secondary | ICD-10-CM

## 2018-09-04 DIAGNOSIS — Z8542 Personal history of malignant neoplasm of other parts of uterus: Secondary | ICD-10-CM

## 2018-09-04 DIAGNOSIS — G893 Neoplasm related pain (acute) (chronic): Secondary | ICD-10-CM

## 2018-09-04 DIAGNOSIS — R634 Abnormal weight loss: Secondary | ICD-10-CM

## 2018-09-04 DIAGNOSIS — R180 Malignant ascites: Secondary | ICD-10-CM

## 2018-09-04 DIAGNOSIS — C786 Secondary malignant neoplasm of retroperitoneum and peritoneum: Secondary | ICD-10-CM

## 2018-09-04 DIAGNOSIS — M6281 Muscle weakness (generalized): Secondary | ICD-10-CM

## 2018-09-04 DIAGNOSIS — R7989 Other specified abnormal findings of blood chemistry: Secondary | ICD-10-CM

## 2018-09-04 DIAGNOSIS — Z87891 Personal history of nicotine dependence: Secondary | ICD-10-CM

## 2018-09-04 LAB — COMPREHENSIVE METABOLIC PANEL
ALT: 17 U/L (ref 0–44)
AST: 21 U/L (ref 15–41)
Albumin: 2.9 g/dL — ABNORMAL LOW (ref 3.5–5.0)
Alkaline Phosphatase: 117 U/L (ref 38–126)
Anion gap: 13 (ref 5–15)
BUN: 9 mg/dL (ref 8–23)
CO2: 22 mmol/L (ref 22–32)
CREATININE: 0.48 mg/dL (ref 0.44–1.00)
Calcium: 9 mg/dL (ref 8.9–10.3)
Chloride: 96 mmol/L — ABNORMAL LOW (ref 98–111)
GFR calc Af Amer: >60 mL/min (ref >60)
GFR calc non Af Amer: >60 mL/min (ref >60)
Glucose, Bld: 156 mg/dL — ABNORMAL HIGH (ref 70–99)
Potassium: 3.3 mmol/L — ABNORMAL LOW (ref 3.5–5.1)
Sodium: 131 mmol/L — ABNORMAL LOW (ref 135–145)
Total Bilirubin: 0.8 mg/dL (ref 0.3–1.2)
Total Protein: 9 g/dL — ABNORMAL HIGH (ref 6.5–8.1)

## 2018-09-04 LAB — CYTOLOGY - NON PAP

## 2018-09-04 LAB — CBC WITH DIFFERENTIAL/PLATELET
Abs Immature Granulocytes: 0.02 10*3/uL (ref 0.00–0.07)
Basophils Absolute: 0 10*3/uL (ref 0.0–0.1)
Basophils Relative: 0 %
EOS ABS: 0 10*3/uL (ref 0.0–0.5)
EOS PCT: 0 %
HCT: 28.1 % — ABNORMAL LOW (ref 36.0–46.0)
Hemoglobin: 8.8 g/dL — ABNORMAL LOW (ref 12.0–15.0)
Immature Granulocytes: 0 %
Lymphocytes Relative: 9 %
Lymphs Abs: 0.7 10*3/uL (ref 0.7–4.0)
MCH: 25.7 pg — AB (ref 26.0–34.0)
MCHC: 31.3 g/dL (ref 30.0–36.0)
MCV: 82.2 fL (ref 80.0–100.0)
Monocytes Absolute: 0.4 10*3/uL (ref 0.1–1.0)
Monocytes Relative: 6 %
Neutro Abs: 6.7 10*3/uL (ref 1.7–7.7)
Neutrophils Relative %: 85 %
Platelets: 383 10*3/uL (ref 150–400)
RBC: 3.42 MIL/uL — ABNORMAL LOW (ref 3.87–5.11)
RDW: 21.3 % — ABNORMAL HIGH (ref 11.5–15.5)
WBC: 7.8 10*3/uL (ref 4.0–10.5)
nRBC: 0 % (ref 0.0–0.2)

## 2018-09-04 MED ORDER — SODIUM CHLORIDE 0.9 % IV SOLN
155.0000 mg/m2 | Freq: Once | INTRAVENOUS | Status: AC
  Administered 2018-09-04: 300 mg via INTRAVENOUS
  Filled 2018-09-04: qty 50

## 2018-09-04 MED ORDER — SODIUM CHLORIDE 0.9 % IV SOLN
510.0000 mg | Freq: Once | INTRAVENOUS | Status: AC
  Administered 2018-09-04: 510 mg via INTRAVENOUS
  Filled 2018-09-04: qty 17

## 2018-09-04 MED ORDER — FAMOTIDINE IN NACL 20-0.9 MG/50ML-% IV SOLN
20.0000 mg | Freq: Once | INTRAVENOUS | Status: AC
  Administered 2018-09-04: 20 mg via INTRAVENOUS
  Filled 2018-09-04: qty 50

## 2018-09-04 MED ORDER — DEXAMETHASONE SODIUM PHOSPHATE 10 MG/ML IJ SOLN
10.0000 mg | Freq: Once | INTRAMUSCULAR | Status: AC
  Administered 2018-09-04: 10 mg via INTRAVENOUS
  Filled 2018-09-04: qty 1

## 2018-09-04 MED ORDER — DIPHENHYDRAMINE HCL 50 MG/ML IJ SOLN
25.0000 mg | Freq: Once | INTRAMUSCULAR | Status: AC
  Administered 2018-09-04: 25 mg via INTRAVENOUS
  Filled 2018-09-04: qty 1

## 2018-09-04 MED ORDER — SODIUM CHLORIDE 0.9 % IV SOLN
Freq: Once | INTRAVENOUS | Status: AC
  Administered 2018-09-04: 11:00:00 via INTRAVENOUS
  Filled 2018-09-04: qty 250

## 2018-09-04 MED ORDER — SODIUM CHLORIDE 0.9 % IV SOLN
558.0000 mg | Freq: Once | INTRAVENOUS | Status: AC
  Administered 2018-09-04: 560 mg via INTRAVENOUS
  Filled 2018-09-04: qty 56

## 2018-09-04 MED ORDER — PALONOSETRON HCL INJECTION 0.25 MG/5ML
0.2500 mg | Freq: Once | INTRAVENOUS | Status: AC
  Administered 2018-09-04: 0.25 mg via INTRAVENOUS
  Filled 2018-09-04: qty 5

## 2018-09-04 NOTE — Therapy (Signed)
[facility] [address] [place], [place], [place], [address] Phone: [phone]   Fax:  [phone]  Occupational Therapy Screen  Patient Details  Name: Elizabeth Davenport MRN: [id number] Date of Birth: 1956-01-10 No data recorded  Encounter Date: 09/04/2018    Past Medical History:  Diagnosis Date  . Arthritis    left knee  . Asthma   . Endometrial cancer ([place]) 11/04/14  . PONV (postoperative nausea and vomiting)     Past Surgical History:  Procedure Laterality Date  . ABDOMINAL HYSTERECTOMY    . LAPAROSCOPIC BILATERAL SALPINGO OOPHERECTOMY Bilateral 11/04/2014   Procedure: LAPAROSCOPIC BILATERAL SALPINGO OOPHORECTOMY;  Surgeon: Mellody Drown, MD;  Location: [facility] ORS;  Service: Gynecology;  Laterality: Bilateral;  . LAPAROSCOPIC HYSTERECTOMY N/A 11/04/2014   Procedure: HYSTERECTOMY TOTAL LAPAROSCOPIC;  Surgeon: Mellody Drown, MD;  Location: [facility] ORS;  Service: Gynecology;  Laterality: N/A;  . removed mass from thyriod      There were no vitals filed for this visit.  Subjective Assessment - 09/04/18 1133    Subjective   I am just getting weaker over all and legs - had one fall - trip over my own feet - I have pain after I eat - but the Tylenol helps     Currently in Pain?  No/denies         OT was asked by Altha Harm of Palliative care to screen pt - she is gradually getting more weak and had on fall. Using cane   Pt's PMH  per Dr Gary Fleet las note :   1.  Progressive low-grade ovarian malignancy: CT scan results from June 06, 2018 reviewed independently with clear progression of disease.  Patient CA-125 is trending down and is now 95.6, today's result is pending.  Previously, patient underwent optimal surgical debulking and then completed 3 cycles of adjuvant carboplatinum and Taxol on February 26, 2018.  Patient was initially placed on maintenance letrozole, but could not tolerate treatment and this was  switched to Aromasin. Hospice and end-of-life care were previously discussed, but patient declined and wished to additional treatment.  Because of progressive disease, patient was reinitiated on carboplatinum and Taxol.  Proceed with cycle 4 today.  Return to clinic in 3 weeks for further evaluation and consideration of cycle 5 by Dr Grayland Ormond,  Plan to reimage after cycle 6.       2.  History of endometrial cancer: Patient had total hysterectomy.  Imaging as above. 3.  Anemia: Patient's hemoglobin has mildly improved to 9.0.  She continues to have persistently decreased iron stores.  Patient last received IV Feraheme on August 02, 2018.  Consider repeat treatment in the near future. 4.  Leukopenia: Resolved.   5.  Anxiety: Continue Xanax as needed.  Patient was given a refill today. 6.  Indigestion: Continue Protonix as prescribed. 7.  Elevated ferritin: Likely secondary to acute phase reactant secondary to malignancy.  Monitor.  Pt seen in Infusion room - daughter with her and able to help with translation  :  Pt report she is weaker - and had one fall - tripping over her own feet. She do use cane and her house is one story - with 4 steps to enter with railing. Review with pt and daughter  Fall prevention . As well as how to make home safer. Hand out provided .   She has walk in shower - recommend hand held shower and BSC to use in shower and over the  toilet. As well as at night time next to bed.   Pt to walk in the house  on her good days.She can also do sit <> stand exercises for quads . And some hip ABD and extention while holding on the the counter.  Pt report she feels better the 2nd week after chemo - schedule to come in to see me  for a balance screening and to assess fall risk.                                Patient will benefit from skilled therapeutic intervention in order to improve the following deficits and impairments:     Visit Diagnosis: Pain in  both lower legs  Muscle weakness (generalized)    Problem List Patient Active Problem List   Diagnosis Date Noted  . Refusal of blood transfusions as patient is Jehovah's Witness 11/30/2017  . 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 History of endometrial cancer 11/08/2016  . 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 Ovarian carcinoma, unspecified laterality ([place]) 11/25/2014  . Endometrial cancer ([place]) 11/04/2014  . S/P laparoscopic hysterectomy 11/04/2014    Rosalyn Gess  OTR/L,CLT 09/04/2018, 11:34 AM  [facility] [address] [place], [place], [place], [address] Phone: [phone]   Fax:  [phone]  Name: Elizabeth Davenport MRN: [id number] Date of Birth: 1956/05/12

## 2018-09-04 NOTE — Progress Notes (Signed)
Patient here for follow up she is still experiencing abdominal pain after eating, more frequent.

## 2018-09-04 NOTE — Progress Notes (Signed)
[place]  Telephone:([phone] [phone]   Name: Elizabeth Davenport Date: 09/04/2018 MRN: [id number]  DOB: [birth year]  Patient Care Team: Letta Median, MD as PCP - General (Family Medicine) Clent Jacks, RN as Registered Nurse Rico Junker, RN as Registered Nurse Theodore Demark, RN as Registered Nurse    REASON FOR CONSULTATION: Palliative Care consult requested for this 63 y.o. 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  Patient has had disease progression on treatment with carboplatinum-Taxol.  She has also been treated with letrozole but was discontinued due to intolerance.  CT scan shows peritoneal carcinomatosis.  She has had malignant ascites requiring paracentesis.  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  End-of-life care and hospice had been previously discussed but patient opted to continue treatment.  Patient was referred to palliative care to help with discussions regarding goals.   SOCIAL HISTORY:    Patient is married and lives at home with her husband and a daughter.  She has another daughter who lives outside the home.  ADVANCE DIRECTIVES:  Does not have  CODE STATUS:   PAST MEDICAL HISTORY: Past Medical History:  Diagnosis Date   Arthritis    left knee   Asthma    Endometrial cancer ([place]) 11/04/14   PONV (postoperative nausea and vomiting)     PAST SURGICAL HISTORY:  Past Surgical History:  Procedure Laterality Date   ABDOMINAL HYSTERECTOMY     LAPAROSCOPIC BILATERAL SALPINGO OOPHERECTOMY Bilateral 11/04/2014   Procedure: LAPAROSCOPIC BILATERAL SALPINGO OOPHORECTOMY;  Surgeon: Mellody Drown, MD;  Location: [facility] ORS;  Service: Gynecology;  Laterality: Bilateral;   LAPAROSCOPIC HYSTERECTOMY N/A 11/04/2014   Procedure: HYSTERECTOMY TOTAL LAPAROSCOPIC;  Surgeon: Mellody Drown, MD;  Location: [facility] ORS;  Service: Gynecology;  Laterality: N/A;   removed mass from thyriod      HEMATOLOGY/ONCOLOGY HISTORY:  Oncology History   Zanylah Hardie, presented as a 63 year old G2, P2 Hispanic female who developed light vaginal bleeding in early 2016.  Reports menopause in 2014.  Dr. Enzo Bi did a Pap that was normal.  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  Focally ER PR negative.  CT scan on 09/22/2014 abdomen/pelvis negative for metastatic disease.  She was referred to gynecologic and exam of cervix was normal.  Slides were reviewed at [facility] and read as probable endometrial primary.  She underwent TLH-BSO on 11/04/2014 at [facility].  Right ovarian excresence noted at surgery.  Survey of the rest of the abdomen, including omentum, and right diaphragm was negative.  Further staging was not performed due to patient's refusal to accept blood products that she is a Jehovah's Witness.  Pathology at [facility] showed a 2.6 cm grade 1 endometrial cancer without LVSI or cervical involvement.  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  No spread noted but washings were positive.  In view of the path report, suggestive of two primaries, pathology was reviewed at [facility].  The review confirmed that the uterine tumor is in early grade 1 endometrial adenocarcinoma.  However the ovarian excresences (were read as low-grade serous carcinoma) were interpreted as serous borderline tumors by [facility] pathologists (multiple).  The recommendation was for surveillance.  CA 125 05/05/2015 8.1 11/03/2015 12.7 05/10/2016 16.2 11/08/2016 15.4 05/23/2017 30.6 07/09/2017 25.2  On 10/31/2017 she presented to [facility] complaining of left lower quadrant  abdominal pain which showed interval development of omental and peritoneal implants/metastatic disease and small amount of ascites with multiple small nodularities in the left upper quadrant including the left  hemidiaphragm consistent with peritoneal implants.  Mildly rounded nodular density/lymph nodes at the cardiophrenic angles new and concerning for metastatic disease.  CT-guided biopsy performed. Pathology: A.  Omentum, left anterior abdomen-positive for involvement by epithelial neoplasm of gynecologic origin  11/14/2017 204.4  12/13/2017-underwent diagnostic laparoscopy, omentectomy, optimally debulked, for removal of mass.  Pathology-low-grade serous carcinoma in background of implants of serous borderline tumor.  Positive cytology ' suspicious for malignancy'.  Recommended to start chemotherapy followed by hormonal therapy.    01/22/2018 322.0 02/05/2018 314.0 02/26/2018 75.7  She completed 3 cycles of adjuvant carboplatin-Taxol on 02/26/2018.  Was started on letrozole, but on 04/01/2018 call the clinic and refused to take more due to bone and low back pain.  Plan was to washout and start anastrozole.  Trametinib was discussed if progressive disease.   03/19/2018 22.1  Presented to ER on 04/05/2018 complaining of low back pain and urinary frequency.  She was treated for possible UTI.  CT scan showed diffuse nodular infiltration throughout the mesentery and omentum consistent with peritoneal carcinomatosis. Started anastrozole.   04/10/2018 93.8 06/03/2018 134.0  Seen in symptom management clinic for abdominal swelling and discomfort.  Underwent paracentesis which yielded 1.9 L of fluid on 06/04/2018.Marland Kitchen  Fluid positive for malignancy.  CT chest/abdomen/pelvis on 06/06/2018 showed progression.  CT chest/abdomen/pelvis scan 06/06/2018 showed progression.  Patient required paracentesis.  Fluid positive for malignant cells.     Ovarian carcinoma, unspecified laterality ([place])   11/25/2014 Initial Diagnosis    Ovarian cancer, bilateral     ALLERGIES:  has No Known Allergies.  MEDICATIONS:  Current Outpatient Medications  Medication Sig Dispense Refill   acetaminophen (TYLENOL) 500 MG tablet Take  500 mg by mouth every 6 (six) hours as needed.     albuterol (PROVENTIL HFA;VENTOLIN HFA) 108 (90 BASE) MCG/ACT inhaler Inhale 2 puffs into the lungs every 6 (six) hours as needed for wheezing or shortness of breath.     ALPRAZolam (XANAX) 0.25 MG tablet Take 1 tablet (0.25 mg total) by mouth 2 (two) times daily as needed for anxiety. 30 tablet 1   dexamethasone (DECADRON) 4 MG tablet Take 1 tablet (4 mg total) by mouth daily for 5 days. 5 tablet 0   exemestane (AROMASIN) 25 MG tablet Take 1 tablet (25 mg total) by mouth daily after breakfast. (Patient not taking: Reported on 08/14/2018) 30 tablet 3   fluticasone (FLOVENT HFA) 220 MCG/ACT inhaler Take by mouth.     gabapentin (NEURONTIN) 300 MG capsule Take 1 capsule (300 mg total) by mouth 2 (two) times daily. Neuropathic pain 30 capsule 0   metoCLOPramide (REGLAN) 10 MG tablet Take 0.5 tablets (5 mg total) by mouth 4 (four) times daily -  before meals and at bedtime. 60 tablet 1   ondansetron (ZOFRAN) 8 MG tablet Take 1 tablet (8 mg total) by mouth 2 (two) times daily as needed for refractory nausea / vomiting. 30 tablet 2   oxyCODONE (OXY IR/ROXICODONE) 5 MG immediate release tablet Take 0.5-1 tablets (2.5-5 mg total) by mouth every 4 (four) hours as needed for severe pain or breakthrough pain. 30 tablet 0   pantoprazole (PROTONIX) 40 MG tablet Take 1 tablet (40 mg total) by mouth daily. For acid reflux 90 tablet 3   polyethylene glycol (MIRALAX) packet Take 17 g by mouth daily. [place]  each 0   prochlorperazine (COMPAZINE) 10 MG tablet Take 1 tablet (10 mg total) by mouth every 6 (six) hours as needed (Nausea or vomiting). 60 tablet 2   senna-docusate (SENNA S) 8.6-50 MG tablet Take 1 tablet by mouth daily. 30 tablet 0   traMADol (ULTRAM) 50 MG tablet Take 1 tablet (50 mg total) by mouth every 6 (six) hours as needed for moderate pain or severe pain. 5 tablet 0   No current facility-administered medications for this visit.     VITAL  SIGNS: LMP 11/04/2011  There were no vitals filed for this visit.  Estimated body mass index is 29.8 kg/m as calculated from the following:   Height as of an earlier encounter on 09/04/18: 5' 1" (1.549 m).   Weight as of an earlier encounter on 09/04/18: 157 lb 11.2 oz (71.5 kg).  LABS: CBC:    Component Value Date/Time   WBC 7.8 09/04/2018 0821   HGB 8.8 (L) 09/04/2018 0821   HCT 28.1 (L) 09/04/2018 0821   PLT 383 09/04/2018 0821   MCV 82.2 09/04/2018 0821   NEUTROABS 6.7 09/04/2018 0821   LYMPHSABS 0.7 09/04/2018 0821   MONOABS 0.4 09/04/2018 0821   EOSABS 0.0 09/04/2018 0821   BASOSABS 0.0 09/04/2018 0821   Comprehensive Metabolic Panel:    Component Value Date/Time   NA 131 (L) 09/04/2018 0821   K 3.3 (L) 09/04/2018 0821   CL 96 (L) 09/04/2018 0821   CO2 22 09/04/2018 0821   BUN 9 09/04/2018 0821   CREATININE 0.48 09/04/2018 0821   GLUCOSE 156 (H) 09/04/2018 0821   CALCIUM 9.0 09/04/2018 0821   AST 21 09/04/2018 0821   ALT 17 09/04/2018 0821   ALKPHOS 117 09/04/2018 0821   BILITOT 0.8 09/04/2018 0821   PROT 9.0 (H) 09/04/2018 0821   ALBUMIN 2.9 (L) 09/04/2018 0821    RADIOGRAPHIC STUDIES: Dg Chest 2 View  Result Date: 09/01/2018 CLINICAL DATA:  Suspected sepsis.  Pain. EXAM: CHEST - 2 VIEW COMPARISON:  Oct 28, 2014 FINDINGS: The heart size and mediastinal contours are within normal limits. Both lungs are clear. The visualized skeletal structures are unremarkable. IMPRESSION: No active cardiopulmonary disease. Electronically Signed   By: Dorise Bullion III M.D   On: 09/01/2018 18:45   Ct Abdomen Pelvis W Contrast  Result Date: 09/01/2018 CLINICAL DATA:  Bilateral lower abdominal pain, worse on the left. History of ovarian cancer on chemotherapy, last 2 weeks ago. Increased pain today. EXAM: CT ABDOMEN AND PELVIS WITH CONTRAST TECHNIQUE: Multidetector CT imaging of the abdomen and pelvis was performed using the standard protocol following bolus administration of  intravenous contrast. CONTRAST:  117m OMNIPAQUE IOHEXOL 300 MG/ML  SOLN COMPARISON:  06/06/2018 FINDINGS: Lower chest: Lung bases are clear. Right cardiophrenic angle lymph nodes seen on previous study are decreased in size. Hepatobiliary: Several subcentimeter low-attenuation lesions in the liver without change since previous study, probably cysts. Gallbladder and bile ducts are unremarkable. Pancreas: Unremarkable. No pancreatic ductal dilatation or surrounding inflammatory changes. Spleen: Normal in size without focal abnormality. Adrenals/Urinary Tract: No adrenal gland nodules. Right renal cysts without change. Nephrograms are otherwise homogeneous and symmetrical. No hydronephrosis or hydroureter. Bladder wall is not thickened. No bladder filling defects. Stomach/Bowel: Stomach, small bowel, and colon are mostly decompressed. Scattered stool in the colon. No wall thickening or inflammatory changes identified. Vascular/Lymphatic: Scattered calcification of the aorta. No aneurysm. Scattered mesenteric and retroperitoneal lymph nodes are mildly prominent, similar to prior study. Iliac chain pelvic lymph nodes,  most prominent on the left. These measure only about 9 mm in short axis dimension but appear to have enlarged since previous study. Possible to represent progression of metastatic disease. Reproductive: Status post hysterectomy. No adnexal masses. Other: Moderate free fluid in the abdomen and pelvis, similar to previous study. No free air. Nodular changes in the mesentery and omentum probably representing peritoneal implants. Musculoskeletal: No destructive bone lesions. IMPRESSION: 1. 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 Similar to previous study. 2. Mild prominence of mesenteric and retroperitoneal lymph nodes, similar to prior study. Increased prominence of pelvic lymph nodes, possibly  representing progression of metastatic disease. Right cardiophrenic angle nodes seen on previous study are decreased in size. 3. No evidence of bowel obstruction or inflammation. Electronically Signed   By: Lucienne Capers M.D.   On: 09/01/2018 20:06   US Paracentesis  Result Date: 09/03/2018 INDICATION: Patient with history of endometrial and ovarian cancers, recurrent malignant ascites. Request made for diagnostic and therapeutic paracentesis. EXAM: ULTRASOUND GUIDED DIAGNOSTIC AND THERAPEUTIC PARACENTESIS MEDICATIONS: None COMPLICATIONS: None immediate. PROCEDURE: Informed written consent was obtained from the patient after a discussion of the risks, benefits and alternatives to treatment. A timeout was performed prior to the initiation of the procedure. Initial ultrasound scanning demonstrates a small amount of ascites within the right mid to lower abdominal quadrant. The right mid to lower abdomen was prepped and draped in the usual sterile fashion. 1% lidocaine was used for local anesthesia. Following this, a 19 gauge, 10-cm, Yueh catheter was introduced. An ultrasound image was saved for documentation purposes. The paracentesis was performed. The catheter was removed and a dressing was applied. The patient tolerated the procedure well without immediate post procedural complication. FINDINGS: A total of approximately 360 cc of slightly hazy, yellow fluid was removed. Samples were sent to the laboratory as requested by the clinical team. IMPRESSION: Successful ultrasound-guided diagnostic and therapeutic paracentesis yielding 360 cc of peritoneal fluid. Read by: Rowe Robert, PA-C Electronically Signed   By: Sandi Mariscal M.D.   On: 09/03/2018 10:55    PERFORMANCE STATUS (ECOG) : 1 - Symptomatic but completely ambulatory  REVIEW OF SYSTEMS: As noted above. Otherwise, a complete review of systems is negative.  PHYSICAL EXAM: General: NAD, frail appearing, thin Cardiovascular: regular rate and  rhythm Pulmonary: clear ant fields Abdomen: soft, nontender, + bowel sounds GU: no suprapubic tenderness Extremities: no edema, no joint deformities Skin: no rashes Neurological: Weakness but otherwise nonfocal  IMPRESSION: I met with patient and her 2 daughters today in the clinic.  Introduced palliative care services and attempted to establish therapeutic rapport.  Patient has a visit today with Dr. Grayland Ormond.  Patient states she was recently informed that her cancer had spread and wants to talk to Dr. Grayland Ormond about treatment options versus transition away from treatment.  Patient says that she knows the cancer is not curable and will eventually result in her death.  I note that abdominal CT scan on 09/01/2018 reveals increased pelvic lymph nodes possibly reflecting progression of metastatic disease.  Symptomatically, patient has had intermittent abdominal pain but says that she finds relief with intermittent use of oxycodone.  Patient does report chronic constipation and often goes 3 or 4 days without having a bowel movement.  She is taking MiraLAX and PRN Senokot.  I recommended that she increase his Senokot to twice daily and continue daily MiraLAX.  Patient endorses progressive weakness over the  past month.  She is still functionally independent in the home but has had a recent fall.  She uses a cane to ambulate.  We talked about use of a walker if needed.  We will ask that she be evaluated by OT here in the clinic.  Oral intake is poor.  Weight loss noted.  Patient has lost about 10 pounds over the past month and a half.  Current weight is 157 pounds.  Discussed increasing oral supplements to 3 times daily.  We talked about the importance of small more frequent meals throughout the day with a focus on high-protein and high-calorie foods.  Will refer to RD.  We discussed advance care plans.  Patient has been given both healthcare power of attorney and living will documents in the past but is  not completing them.  Patient says she would want her husband and daughter to be her primary decision makers if necessary.  Patient says she will review ACP documents at home and will bring those in to have them notarized.  We discussed CODE STATUS today and I reviewed with her a MOST form.  Patient wanted to take documents home to speak with her family prior to making any decisions.  Case discussed with Dr. Grayland Ormond  PLAN: -Continue current scope of treatment -Continue oxycodone as needed for pain -Recommend increasing Senokot to twice daily -Refer to RD -Refer to OT -ACP reviewed -MOST Form discussed -RTC 2 weeks   Patient expressed understanding and was in agreement with this plan. She also understands that She can call clinic at any time with any questions, concerns, or complaints.     Time Total: 45 minutes  Visit consisted of counseling and education dealing with the complex and emotionally intense issues of symptom management and palliative care in the setting of serious and potentially life-threatening illness.Greater than 50%  of this time was spent counseling and coordinating care related to the above assessment and plan.  Signed by: Altha Harm, PhD, NP-C [phone] (Work Cell)

## 2018-09-06 ENCOUNTER — Other Ambulatory Visit: Payer: Self-pay

## 2018-09-06 ENCOUNTER — Telehealth: Payer: Self-pay | Admitting: *Deleted

## 2018-09-06 DIAGNOSIS — Z87891 Personal history of nicotine dependence: Secondary | ICD-10-CM | POA: Insufficient documentation

## 2018-09-06 DIAGNOSIS — R109 Unspecified abdominal pain: Secondary | ICD-10-CM | POA: Insufficient documentation

## 2018-09-06 DIAGNOSIS — K5900 Constipation, unspecified: Secondary | ICD-10-CM | POA: Insufficient documentation

## 2018-09-06 DIAGNOSIS — R531 Weakness: Secondary | ICD-10-CM | POA: Insufficient documentation

## 2018-09-06 LAB — COMPREHENSIVE METABOLIC PANEL
ALT: 47 U/L — AB (ref 0–44)
AST: 45 U/L — ABNORMAL HIGH (ref 15–41)
Albumin: 2.6 g/dL — ABNORMAL LOW (ref 3.5–5.0)
Alkaline Phosphatase: 127 U/L — ABNORMAL HIGH (ref 38–126)
Anion gap: 15 (ref 5–15)
BUN: 12 mg/dL (ref 8–23)
CO2: 21 mmol/L — ABNORMAL LOW (ref 22–32)
Calcium: 8.3 mg/dL — ABNORMAL LOW (ref 8.9–10.3)
Chloride: 95 mmol/L — ABNORMAL LOW (ref 98–111)
Creatinine, Ser: 0.31 mg/dL — ABNORMAL LOW (ref 0.44–1.00)
GFR calc Af Amer: >60 mL/min (ref >60)
GFR calc non Af Amer: >60 mL/min (ref >60)
Glucose, Bld: 152 mg/dL — ABNORMAL HIGH (ref 70–99)
Potassium: 3.4 mmol/L — ABNORMAL LOW (ref 3.5–5.1)
Sodium: 131 mmol/L — ABNORMAL LOW (ref 135–145)
TOTAL PROTEIN: 8.1 g/dL (ref 6.5–8.1)
Total Bilirubin: 1.8 mg/dL — ABNORMAL HIGH (ref 0.3–1.2)

## 2018-09-06 LAB — CBC
HCT: 27.3 % — ABNORMAL LOW (ref 36.0–46.0)
Hemoglobin: 8.4 g/dL — ABNORMAL LOW (ref 12.0–15.0)
MCH: 25.7 pg — ABNORMAL LOW (ref 26.0–34.0)
MCHC: 30.8 g/dL (ref 30.0–36.0)
MCV: 83.5 fL (ref 80.0–100.0)
Platelets: 318 10*3/uL (ref 150–400)
RBC: 3.27 MIL/uL — ABNORMAL LOW (ref 3.87–5.11)
RDW: 21.4 % — ABNORMAL HIGH (ref 11.5–15.5)
WBC: 7.2 10*3/uL (ref 4.0–10.5)
nRBC: 0 % (ref 0.0–0.2)

## 2018-09-06 LAB — CULTURE, BLOOD (ROUTINE X 2)
Culture: NO GROWTH
Culture: NO GROWTH

## 2018-09-06 LAB — LIPASE, BLOOD: Lipase: 20 U/L (ref 11–51)

## 2018-09-06 NOTE — Telephone Encounter (Signed)
Patient still not having bowel movement and is having pain , she doubled her stool softeners and using Miralax. I advised she try Mag Citrate over the counter and if no results to go to ER for evaluation. She agrees to this plan

## 2018-09-06 NOTE — ED Triage Notes (Signed)
Patient c/o constipation and lower abdominal pain. Last BM on Saturday. Patient has tried multiple OTC remedies with no relief. Patient is a ovarian cancer patient on chemo.

## 2018-09-07 ENCOUNTER — Emergency Department: Payer: Self-pay

## 2018-09-07 ENCOUNTER — Emergency Department
Admission: EM | Admit: 2018-09-07 | Discharge: 2018-09-07 | Disposition: A | Discharge status: Home / Self Care | Payer: Self-pay | Attending: Emergency Medicine | Admitting: Emergency Medicine

## 2018-09-07 DIAGNOSIS — K5900 Constipation, unspecified: Secondary | ICD-10-CM

## 2018-09-07 DIAGNOSIS — R109 Unspecified abdominal pain: Secondary | ICD-10-CM

## 2018-09-07 DIAGNOSIS — R531 Weakness: Secondary | ICD-10-CM

## 2018-09-07 MED ORDER — LACTULOSE 20 G PO PACK: 20.0000 g | PACK | Freq: Two times a day (BID) | ORAL | 0 refills | Status: AC

## 2018-09-07 NOTE — Discharge Instructions (Addendum)
Please follow up closely with your oncologist. Please return for any worsening weakness, fevers, abdominal pain, bloody stool, persistent vomiting or any other new or concerning symptoms.

## 2018-09-07 NOTE — ED Notes (Signed)
X-ray at bedside

## 2018-09-07 NOTE — ED Notes (Addendum)
ED Provider at bedside with STRATUS interpretor   Pt c/o of constipation, tried suppository and mag citrate at home as per "cancer doctor", family (2 daughters) reports hx of constipation  Pt denies fever, nausea, vomiting, reports urge/pressure but can't stool  No acute respiratory distress  No impaction detected by EDP during rectal exam

## 2018-09-07 NOTE — ED Provider Notes (Signed)
[facility] Emergency Department Provider Note   ____________________________________________   I have reviewed the triage vital signs and the nursing notes.   HISTORY  Chief Complaint Constipation and Abdominal Pain   History limited by: [place] utilized   HPI Elizabeth Davenport is a 63 y.o. female who presents to the emergency department today with complaints of constipation and abdominal pain.  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  She states that for the past 6 days she has not been able to have a bowel movement.  She feels pressure in her lower abdomen and rectum feels the need to have a bowel movement but has been able to have one.  She has tried stool softeners and laxatives as well as suppositories and magnesium citrate today without any relief.  Patient will sometimes go 2 to 3 days without a bowel movement in 6 days without a bowel movement was extremely unusual.  She denies any fevers.  Denies any vomiting and diarrhea.   Per medical record review patient has a history of endometrial cancer.  Past Medical History:  Diagnosis Date  . Arthritis    left knee  . Asthma   . Endometrial cancer ([place]) 11/04/14  . PONV (postoperative nausea and vomiting)     Patient Active Problem List   Diagnosis Date Noted  . Refusal of blood transfusions as patient is Jehovah's Witness 11/30/2017  . 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 History of endometrial cancer 11/08/2016  . 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 Endometrial cancer ([place]) 11/04/2014  . S/P laparoscopic hysterectomy 11/04/2014    Past Surgical History:  Procedure Laterality Date  . ABDOMINAL HYSTERECTOMY    . LAPAROSCOPIC BILATERAL SALPINGO OOPHERECTOMY Bilateral 11/04/2014   Procedure: LAPAROSCOPIC BILATERAL SALPINGO OOPHORECTOMY;  Surgeon:  Mellody Drown, MD;  Location: [facility] ORS;  Service: Gynecology;  Laterality: Bilateral;  . LAPAROSCOPIC HYSTERECTOMY N/A 11/04/2014   Procedure: HYSTERECTOMY TOTAL LAPAROSCOPIC;  Surgeon: Mellody Drown, MD;  Location: [facility] ORS;  Service: Gynecology;  Laterality: N/A;  . removed mass from thyriod      Prior to Admission medications   Medication Sig Start Date End Date Taking? Authorizing Provider  acetaminophen (TYLENOL) 500 MG tablet Take 500 mg by mouth every 6 (six) hours as needed.    [provider]  albuterol (PROVENTIL HFA;VENTOLIN HFA) 108 (90 BASE) MCG/ACT inhaler Inhale 2 puffs into the lungs every 6 (six) hours as needed for wheezing or shortness of breath.    [provider]  ALPRAZolam Duanne Moron) 0.25 MG tablet Take 1 tablet (0.25 mg total) by mouth 2 (two) times daily as needed for anxiety. 08/28/18   Lloyd Huger, MD  dexamethasone (DECADRON) 4 MG tablet Take 1 tablet (4 mg total) by mouth daily for 5 days. 09/02/18 09/07/18  Verlon Au, NP  exemestane (AROMASIN) 25 MG tablet Take 1 tablet (25 mg total) by mouth daily after breakfast. Patient not taking: Reported on 08/14/2018 04/11/18   Lloyd Huger, MD  fluticasone (FLOVENT HFA) 220 MCG/ACT inhaler Take by mouth.    [provider]  gabapentin (NEURONTIN) 300 MG capsule Take 1 capsule (300 mg total) by mouth 2 (two) times daily. Neuropathic pain 06/21/18   Verlon Au, NP  metoCLOPramide (REGLAN) 10 MG tablet Take 0.5 tablets (5 mg total) by mouth 4 (four) times daily -  before meals and at bedtime. 09/02/18  Verlon Au, NP  ondansetron (ZOFRAN) 8 MG tablet Take 1 tablet (8 mg total) by mouth 2 (two) times daily as needed for refractory nausea / vomiting. 01/11/18   Lloyd Huger, MD  oxyCODONE (OXY IR/ROXICODONE) 5 MG immediate release tablet Take 0.5-1 tablets (2.5-5 mg total) by mouth every 4 (four) hours as needed for severe pain or breakthrough pain. 09/02/18   Verlon Au,  NP  pantoprazole (PROTONIX) 40 MG tablet Take 1 tablet (40 mg total) by mouth daily. For acid reflux 06/21/18   Verlon Au, NP  polyethylene glycol [facility]) packet Take 17 g by mouth daily. 06/18/18   Jacquelin Hawking, NP  prochlorperazine (COMPAZINE) 10 MG tablet Take 1 tablet (10 mg total) by mouth every 6 (six) hours as needed (Nausea or vomiting). 01/11/18   Lloyd Huger, MD  senna-docusate (SENNA S) 8.6-50 MG tablet Take 1 tablet by mouth daily. 06/18/18   Jacquelin Hawking, NP  traMADol (ULTRAM) 50 MG tablet Take 1 tablet (50 mg total) by mouth every 6 (six) hours as needed for moderate pain or severe pain. 06/21/18   Verlon Au, NP    Allergies Patient has no known allergies.  Family History  Problem Relation Age of Onset  . Breast cancer Sister 12  . Asthma Mother   . Coronary artery disease Mother   . Diabetes Brother   . Diabetes Sister   . Diabetes Sister   . Diabetes Brother     Social History Social History   Tobacco Use  . Smoking status: Former Smoker    Packs/day: 0.25    Years: 8.00    Pack years: 2.00    Types: Cigarettes    Last attempt to quit: 06/29/1990    Years since quitting: 28.2  . Smokeless tobacco: Never Used  . Tobacco comment: Smoking History Cigarettes 1 cig per day; quit 22 years ago; smoked for 8 years  Substance Use Topics  . Alcohol use: No    Alcohol/week: 0.0 standard drinks    Comment: occasional   . Drug use: No    Review of Systems Constitutional: No fever/chills Eyes: No visual changes. ENT: No sore throat. Cardiovascular: Denies chest pain. Respiratory: Denies shortness of breath. Gastrointestinal: Positive for abdominal pain, constipation.  Genitourinary: Negative for dysuria. Musculoskeletal: Negative for back pain. Skin: Negative for rash. Neurological: Negative for headaches, focal weakness or numbness.  ____________________________________________   PHYSICAL EXAM:  VITAL SIGNS: ED Triage Vitals  [09/06/18 2044]  Enc Vitals Group     BP 111/76     Pulse Rate (!) 107     Resp 18     Temp 98 F (36.7 C)     Temp Source Oral     SpO2 97 %     Weight 158 lb (71.7 kg)     Height 5\' 1"  (1.549 m)     Head Circumference      Peak Flow      Pain Score 10   Constitutional: Alert and oriented.  Eyes: Conjunctivae are normal.  ENT      Head: Normocephalic and atraumatic.      Nose: No congestion/rhinnorhea.      Mouth/Throat: Mucous membranes are moist.      Neck: No stridor. Hematological/Lymphatic/Immunilogical: No cervical lymphadenopathy. Cardiovascular: Normal rate, regular rhythm.  No murmurs, rubs, or gallops. Respiratory: Normal respiratory effort without tachypnea nor retractions. Breath sounds are clear and equal bilaterally. No wheezes/rales/rhonchi. Gastrointestinal: Soft and minimally tender  to palpation in the lower abdomen. Rectal: No impaction. Musculoskeletal: Normal range of motion in all extremities. No lower extremity edema. Neurologic:  Normal speech and language. No gross focal neurologic deficits are appreciated.  Skin:  Skin is warm, dry and intact. No rash noted. Psychiatric: Mood and affect are normal. Speech and behavior are normal. Patient exhibits appropriate insight and judgment.  ____________________________________________    LABS (pertinent positives/negatives)  Lipase 20 CBC wbc 7.2, hgb 8.4, plt 318 CMP na 131, k 3.4, glu 152, cr 0.31  ____________________________________________   EKG  None  ____________________________________________    RADIOLOGY  Abd x-rays No signs of obstruction or free air. Some stool burden in left colon.  ____________________________________________   PROCEDURES  Procedures  ____________________________________________   INITIAL IMPRESSION / ASSESSMENT AND PLAN / ED COURSE  Pertinent labs & imaging results that were available during my care of the patient were reviewed by me and considered in  my medical decision making (see chart for details).   Patient presented to the emergency department today because of concerns for constipation patient was referred to patient states she has abdominal 6 days.  Patient does have a history of cancer with carcinomatosis.  Patient had an abdominal x-ray done which not show any signs of obstruction.  Blood work shows a mild hyponatremia which is chronic.  Some very mild elevation of LFTs which could be secondary to decreased oral intake or perhaps cancer of the chemotherapy.  Patient was given an enema with some output.  She states that she did feel better after this.  At this point given that she has had some relief after the enema I do think the constipation could complain some of the patient's discomfort.  Will trial the patient on lactulose.  Discussed with patient importance of following up with oncology.  ____________________________________________   FINAL CLINICAL IMPRESSION(S) / ED DIAGNOSES  Final diagnoses:  Constipation, unspecified constipation type  Abdominal pain, unspecified abdominal location  Weakness     Note: This dictation was prepared with Dragon dictation. Any transcriptional errors that result from this process are unintentional     Nance Pear, MD 09/07/18 [phone]

## 2018-09-07 NOTE — ED Notes (Signed)

## 2018-09-07 NOTE — ED Notes (Signed)
Pt requesting interpreter, EDP notified

## 2018-09-10 ENCOUNTER — Encounter: Payer: Self-pay | Admitting: Nurse Practitioner

## 2018-09-11 ENCOUNTER — Telehealth: Payer: Self-pay | Admitting: *Deleted

## 2018-09-11 ENCOUNTER — Other Ambulatory Visit: Payer: Self-pay | Admitting: Nurse Practitioner

## 2018-09-11 MED ORDER — TRAMADOL HCL 50 MG PO TABS: 50.0000 mg | ORAL_TABLET | Freq: Four times a day (QID) | ORAL | 0 refills | Status: DC | PRN

## 2018-09-11 NOTE — Telephone Encounter (Signed)
I'll send order

## 2018-09-11 NOTE — Telephone Encounter (Signed)
Call from daughter requesting that her mother be put back on Tramadol because the Oxycodone is making her vomit. Please advise

## 2018-09-11 NOTE — Telephone Encounter (Signed)
Ok, that is fine

## 2018-09-11 NOTE — Progress Notes (Signed)
Oxycodone discontinued due to vomiting/patient preference. Refill for tramadol sent to pharmacy.

## 2018-09-17 ENCOUNTER — Other Ambulatory Visit: Payer: Self-pay

## 2018-09-18 ENCOUNTER — Other Ambulatory Visit: Payer: Self-pay

## 2018-09-18 ENCOUNTER — Inpatient Hospital Stay: Payer: Self-pay | Admitting: Occupational Therapy

## 2018-09-18 DIAGNOSIS — D649 Anemia, unspecified: Secondary | ICD-10-CM

## 2018-09-23 ENCOUNTER — Other Ambulatory Visit: Payer: Self-pay | Admitting: *Deleted

## 2018-09-23 MED ORDER — ALPRAZOLAM 0.25 MG PO TABS: 0.2500 mg | ORAL_TABLET | Freq: Two times a day (BID) | ORAL | 1 refills | Status: DC | PRN

## 2018-09-23 NOTE — Therapy (Signed)
[facility] [address] [place], [place], [place], [address] Phone: [phone]   Fax:  [phone]  Patient Details  Name: Elizabeth Davenport MRN: [id number] Date of Birth: 26-Feb-1956 Referring Provider:  Letta Median, MD Encounter Date: 09/18/2018  Talked with daughter over phone- pt doing better  No stumbling and had no falls since last time  Walking some and doing okay   Do not need at this time fall or balance screen  Pt and daughter to talk to [facility] if any needs come up by next Wed or contact me at [phone] if need to see me     Rosalyn Gess OTR/L,CLT 09/23/2018, 10:27 AM  [facility] [address], [place], [place], [address] Phone: [phone]   Fax:  [phone]

## 2018-09-24 ENCOUNTER — Other Ambulatory Visit: Payer: Self-pay

## 2018-09-25 ENCOUNTER — Inpatient Hospital Stay: Payer: Self-pay | Admitting: Oncology

## 2018-09-25 ENCOUNTER — Encounter: Payer: Self-pay | Admitting: Hospice and Palliative Medicine

## 2018-09-25 ENCOUNTER — Inpatient Hospital Stay: Payer: Self-pay

## 2018-09-25 ENCOUNTER — Inpatient Hospital Stay: Payer: Self-pay | Attending: Hospice and Palliative Medicine | Admitting: Hospice and Palliative Medicine

## 2018-09-25 ENCOUNTER — Other Ambulatory Visit: Payer: Self-pay

## 2018-09-25 VITALS — BP 111/74 | HR 104 | Temp 98.2°F | Resp 18 | Wt 154.0 lb

## 2018-09-25 DIAGNOSIS — G893 Neoplasm related pain (acute) (chronic): Secondary | ICD-10-CM

## 2018-09-25 DIAGNOSIS — Z79899 Other long term (current) drug therapy: Secondary | ICD-10-CM | POA: Insufficient documentation

## 2018-09-25 DIAGNOSIS — M129 Arthropathy, unspecified: Secondary | ICD-10-CM | POA: Insufficient documentation

## 2018-09-25 DIAGNOSIS — M545 Low back pain: Secondary | ICD-10-CM | POA: Insufficient documentation

## 2018-09-25 DIAGNOSIS — Z7189 Other specified counseling: Secondary | ICD-10-CM

## 2018-09-25 DIAGNOSIS — Z90710 Acquired absence of both cervix and uterus: Secondary | ICD-10-CM | POA: Insufficient documentation

## 2018-09-25 DIAGNOSIS — C786 Secondary malignant neoplasm of retroperitoneum and peritoneum: Secondary | ICD-10-CM | POA: Insufficient documentation

## 2018-09-25 DIAGNOSIS — C569 Malignant neoplasm of unspecified ovary: Secondary | ICD-10-CM | POA: Insufficient documentation

## 2018-09-25 DIAGNOSIS — J910 Malignant pleural effusion: Secondary | ICD-10-CM | POA: Insufficient documentation

## 2018-09-25 DIAGNOSIS — Z515 Encounter for palliative care: Secondary | ICD-10-CM | POA: Insufficient documentation

## 2018-09-25 DIAGNOSIS — Z90722 Acquired absence of ovaries, bilateral: Secondary | ICD-10-CM | POA: Insufficient documentation

## 2018-09-25 NOTE — Progress Notes (Signed)
Elizabeth Davenport  [phone] [phone]   Name: Elizabeth Davenport Date: 09/25/2018 MRN: [id number]  DOB: 1956/06/07  Patient Care Team: Letta Median, MD as PCP - General (Family Medicine) Clent Jacks, RN as Registered Nurse Rico Junker, RN as Registered Nurse Theodore Demark, RN as Registered Nurse    REASON FOR CONSULTATION: Palliative Care consult requested for this 63 y.o. 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  Patient has had disease progression on treatment with carboplatinum-Taxol.  She has also been treated with letrozole but was discontinued due to intolerance.  CT scan shows peritoneal carcinomatosis.  She has had malignant ascites requiring paracentesis.  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  End-of-life care and hospice had been previously discussed but patient opted to continue treatment.  Patient was referred to palliative care to help with discussions regarding goals.   SOCIAL HISTORY:    Patient is married and lives at home with her husband and a daughter.  She has another daughter who lives outside the home.  ADVANCE DIRECTIVES:  Does not have  CODE STATUS:   PAST MEDICAL HISTORY: Past Medical History:  Diagnosis Date   Arthritis    left knee   Asthma    Endometrial cancer ([place]) 11/04/14   PONV (postoperative nausea and vomiting)     PAST SURGICAL HISTORY:  Past Surgical History:  Procedure Laterality Date   ABDOMINAL HYSTERECTOMY     LAPAROSCOPIC BILATERAL SALPINGO OOPHERECTOMY Bilateral 11/04/2014   Procedure: LAPAROSCOPIC BILATERAL SALPINGO OOPHORECTOMY;  Surgeon: Mellody Drown, MD;  Location: [facility] ORS;  Service: Gynecology;  Laterality: Bilateral;   LAPAROSCOPIC HYSTERECTOMY N/A 11/04/2014   Procedure: HYSTERECTOMY TOTAL LAPAROSCOPIC;  Surgeon: Mellody Drown, MD;  Location: [facility] ORS;  Service: Gynecology;  Laterality: N/A;   removed mass from thyriod      HEMATOLOGY/ONCOLOGY HISTORY:  Oncology History   Abcde Oneil, presented as a 63 year old G2, P2 Hispanic female who developed light vaginal bleeding in early 2016.  Reports menopause in 2014.  Dr. Enzo Bi did a Pap that was normal.  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  Focally ER PR negative.  CT scan on 09/22/2014 abdomen/pelvis negative for metastatic disease.  She was referred to gynecologic and exam of cervix was normal.  Slides were reviewed at [facility] and read as probable endometrial primary.  She underwent TLH-BSO on 11/04/2014 at [facility].  Right ovarian excresence noted at surgery.  Survey of the rest of the abdomen, including omentum, and right diaphragm was negative.  Further staging was not performed due to patient's refusal to accept blood products that she is a Jehovah's Witness.  Pathology at [facility] showed a 2.6 cm grade 1 endometrial cancer without LVSI or cervical involvement.  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  No spread noted but washings were positive.  In view of the path report, suggestive of two primaries, pathology was reviewed at [facility].  The review confirmed that the uterine tumor is in early grade 1 endometrial adenocarcinoma.  However the ovarian excresences (were read as low-grade serous carcinoma) were interpreted as serous borderline tumors by [facility] pathologists (multiple).  The recommendation was for surveillance.  CA 125 05/05/2015 8.1 11/03/2015 12.7 05/10/2016 16.2 11/08/2016 15.4 05/23/2017 30.6 07/09/2017 25.2  On 10/31/2017 she presented to [facility] complaining of left lower quadrant abdominal  pain which showed interval development of omental and peritoneal implants/metastatic disease and small amount of ascites with multiple small nodularities in the left upper quadrant including the left  hemidiaphragm consistent with peritoneal implants.  Mildly rounded nodular density/lymph nodes at the cardiophrenic angles new and concerning for metastatic disease.  CT-guided biopsy performed. Pathology: A.  Omentum, left anterior abdomen-positive for involvement by epithelial neoplasm of gynecologic origin  11/14/2017 204.4  12/13/2017-underwent diagnostic laparoscopy, omentectomy, optimally debulked, for removal of mass.  Pathology-low-grade serous carcinoma in background of implants of serous borderline tumor.  Positive cytology ' suspicious for malignancy'.  Recommended to start chemotherapy followed by hormonal therapy.    01/22/2018 322.0 02/05/2018 314.0 02/26/2018 75.7  She completed 3 cycles of adjuvant carboplatin-Taxol on 02/26/2018.  Was started on letrozole, but on 04/01/2018 call the clinic and refused to take more due to bone and low back pain.  Plan was to washout and start anastrozole.  Trametinib was discussed if progressive disease.   03/19/2018 22.1  Presented to ER on 04/05/2018 complaining of low back pain and urinary frequency.  She was treated for possible UTI.  CT scan showed diffuse nodular infiltration throughout the mesentery and omentum consistent with peritoneal carcinomatosis. Started anastrozole.   04/10/2018 93.8 06/03/2018 134.0  Seen in symptom management clinic for abdominal swelling and discomfort.  Underwent paracentesis which yielded 1.9 L of fluid on 06/04/2018.Marland Kitchen  Fluid positive for malignancy.  CT chest/abdomen/pelvis on 06/06/2018 showed progression.  CT chest/abdomen/pelvis scan 06/06/2018 showed progression.  Patient required paracentesis.  Fluid positive for malignant cells.     Ovarian carcinoma, unspecified laterality ([place])   11/25/2014 Initial Diagnosis    Ovarian cancer, bilateral     ALLERGIES:  has No Known Allergies.  MEDICATIONS:  Current Outpatient Medications  Medication Sig Dispense Refill   acetaminophen (TYLENOL) 500 MG tablet Take  500 mg by mouth every 6 (six) hours as needed.     albuterol (PROVENTIL HFA;VENTOLIN HFA) 108 (90 BASE) MCG/ACT inhaler Inhale 2 puffs into the lungs every 6 (six) hours as needed for wheezing or shortness of breath.     ALPRAZolam (XANAX) 0.25 MG tablet Take 1 tablet (0.25 mg total) by mouth 2 (two) times daily as needed for anxiety. 60 tablet 1   fluticasone (FLOVENT HFA) 220 MCG/ACT inhaler Take by mouth.     metoCLOPramide (REGLAN) 10 MG tablet Take 0.5 tablets (5 mg total) by mouth 4 (four) times daily -  before meals and at bedtime. 60 tablet 1   ondansetron (ZOFRAN) 8 MG tablet Take 1 tablet (8 mg total) by mouth 2 (two) times daily as needed for refractory nausea / vomiting. 30 tablet 2   pantoprazole (PROTONIX) 40 MG tablet Take 1 tablet (40 mg total) by mouth daily. For acid reflux 90 tablet 3   polyethylene glycol (MIRALAX) packet Take 17 g by mouth daily. 14 each 0   prochlorperazine (COMPAZINE) 10 MG tablet Take 1 tablet (10 mg total) by mouth every 6 (six) hours as needed (Nausea or vomiting). 60 tablet 2   senna-docusate (SENNA S) 8.6-50 MG tablet Take 1 tablet by mouth daily. 30 tablet 0   exemestane (AROMASIN) 25 MG tablet Take 1 tablet (25 mg total) by mouth daily after breakfast. (Patient not taking: Reported on 08/14/2018) 30 tablet 3   gabapentin (NEURONTIN) 300 MG capsule Take 1 capsule (300 mg total) by mouth 2 (two) times daily. Neuropathic pain (Patient not taking: Reported on 09/25/2018) 30 capsule 0   traMADol (ULTRAM)  50 MG tablet Take 1 tablet (50 mg total) by mouth every 6 (six) hours as needed for moderate pain or severe pain. (Patient not taking: Reported on 09/25/2018) 60 tablet 0   No current facility-administered medications for this visit.     VITAL SIGNS: BP 111/74 (BP Location: Left Arm, Patient Position: Sitting)    Pulse (!) 104    Temp 98.2 F (36.8 C)    Resp 18    Wt 154 lb (69.9 kg)    LMP 11/04/2011    BMI 29.10 kg/m  Filed Weights   09/25/18  1039  Weight: 154 lb (69.9 kg)    Estimated body mass index is 29.1 kg/m as calculated from the following:   Height as of 09/06/18: 5' 1"  (1.549 m).   Weight as of this encounter: 154 lb (69.9 kg).  LABS: CBC:    Component Value Date/Time   WBC 7.2 09/06/2018 2056   HGB 8.4 (L) 09/06/2018 2056   HCT 27.3 (L) 09/06/2018 2056   PLT 318 09/06/2018 2056   MCV 83.5 09/06/2018 2056   NEUTROABS 6.7 09/04/2018 0821   LYMPHSABS 0.7 09/04/2018 0821   MONOABS 0.4 09/04/2018 0821   EOSABS 0.0 09/04/2018 0821   BASOSABS 0.0 09/04/2018 0821   Comprehensive Metabolic Panel:    Component Value Date/Time   NA 131 (L) 09/06/2018 2056   K 3.4 (L) 09/06/2018 2056   CL 95 (L) 09/06/2018 2056   CO2 21 (L) 09/06/2018 2056   BUN 12 09/06/2018 2056   CREATININE 0.31 (L) 09/06/2018 2056   GLUCOSE 152 (H) 09/06/2018 2056   CALCIUM 8.3 (L) 09/06/2018 2056   AST 45 (H) 09/06/2018 2056   ALT 47 (H) 09/06/2018 2056   ALKPHOS 127 (H) 09/06/2018 2056   BILITOT 1.8 (H) 09/06/2018 2056   PROT 8.1 09/06/2018 2056   ALBUMIN 2.6 (L) 09/06/2018 2056    RADIOGRAPHIC STUDIES: Dg Chest 2 View  Result Date: 09/01/2018 CLINICAL DATA:  Suspected sepsis.  Pain. EXAM: CHEST - 2 VIEW COMPARISON:  Oct 28, 2014 FINDINGS: The heart size and mediastinal contours are within normal limits. Both lungs are clear. The visualized skeletal structures are unremarkable. IMPRESSION: No active cardiopulmonary disease. Electronically Signed   By: Dorise Bullion III M.D   On: 09/01/2018 18:45   Ct Abdomen Pelvis W Contrast  Result Date: 09/01/2018 CLINICAL DATA:  Bilateral lower abdominal pain, worse on the left. History of ovarian cancer on chemotherapy, last 2 weeks ago. Increased pain today. EXAM: CT ABDOMEN AND PELVIS WITH CONTRAST TECHNIQUE: Multidetector CT imaging of the abdomen and pelvis was performed using the standard protocol following bolus administration of intravenous contrast. CONTRAST:  19m OMNIPAQUE IOHEXOL 300  MG/ML  SOLN COMPARISON:  06/06/2018 FINDINGS: Lower chest: Lung bases are clear. Right cardiophrenic angle lymph nodes seen on previous study are decreased in size. Hepatobiliary: Several subcentimeter low-attenuation lesions in the liver without change since previous study, probably cysts. Gallbladder and bile ducts are unremarkable. Pancreas: Unremarkable. No pancreatic ductal dilatation or surrounding inflammatory changes. Spleen: Normal in size without focal abnormality. Adrenals/Urinary Tract: No adrenal gland nodules. Right renal cysts without change. Nephrograms are otherwise homogeneous and symmetrical. No hydronephrosis or hydroureter. Bladder wall is not thickened. No bladder filling defects. Stomach/Bowel: Stomach, small bowel, and colon are mostly decompressed. Scattered stool in the colon. No wall thickening or inflammatory changes identified. Vascular/Lymphatic: Scattered calcification of the aorta. No aneurysm. Scattered mesenteric and retroperitoneal lymph nodes are mildly prominent, similar to prior study. Iliac  chain pelvic lymph nodes, most prominent on the left. These measure only about 9 mm in short axis dimension but appear to have enlarged since previous study. Possible to represent progression of metastatic disease. Reproductive: Status post hysterectomy. No adnexal masses. Other: Moderate free fluid in the abdomen and pelvis, similar to previous study. No free air. Nodular changes in the mesentery and omentum probably representing peritoneal implants. Musculoskeletal: No destructive bone lesions. IMPRESSION: 1. 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 Similar to previous study. 2. Mild prominence of mesenteric and retroperitoneal lymph nodes, similar to prior study. Increased prominence of pelvic lymph nodes, possibly representing progression of metastatic disease. Right cardiophrenic  angle nodes seen on previous study are decreased in size. 3. No evidence of bowel obstruction or inflammation. Electronically Signed   By: Lucienne Capers M.D.   On: 09/01/2018 20:06   US Paracentesis  Result Date: 09/03/2018 INDICATION: Patient with history of endometrial and ovarian cancers, recurrent malignant ascites. Request made for diagnostic and therapeutic paracentesis. EXAM: ULTRASOUND GUIDED DIAGNOSTIC AND THERAPEUTIC PARACENTESIS MEDICATIONS: None COMPLICATIONS: None immediate. PROCEDURE: Informed written consent was obtained from the patient after a discussion of the risks, benefits and alternatives to treatment. A timeout was performed prior to the initiation of the procedure. Initial ultrasound scanning demonstrates a small amount of ascites within the right mid to lower abdominal quadrant. The right mid to lower abdomen was prepped and draped in the usual sterile fashion. 1% lidocaine was used for local anesthesia. Following this, a 19 gauge, 10-cm, Yueh catheter was introduced. An ultrasound image was saved for documentation purposes. The paracentesis was performed. The catheter was removed and a dressing was applied. The patient tolerated the procedure well without immediate post procedural complication. FINDINGS: A total of approximately 360 cc of slightly hazy, yellow fluid was removed. Samples were sent to the laboratory as requested by the clinical team. IMPRESSION: Successful ultrasound-guided diagnostic and therapeutic paracentesis yielding 360 cc of peritoneal fluid. Read by: Rowe Robert, PA-C Electronically Signed   By: Sandi Mariscal M.D.   On: 09/03/2018 10:55   Dg Abd Portable 2 Views  Result Date: 09/07/2018 CLINICAL DATA:  Constipation EXAM: PORTABLE ABDOMEN - 2 VIEW COMPARISON:  CT abdomen/pelvis dated 09/01/2018 FINDINGS: Nonobstructive bowel gas pattern. Mild left colonic stool burden at the splenic flexure. No evidence of free air under the diaphragm on the upright view.  Visualized osseous structures are within normal limits. IMPRESSION: No evidence of small bowel obstruction or free air. Mild left colonic stool burden. Electronically Signed   By: Julian Hy M.D.   On: 09/07/2018 04:16    PERFORMANCE STATUS (ECOG) : 1 - Symptomatic but completely ambulatory  REVIEW OF SYSTEMS: As noted above. Otherwise, a complete review of systems is negative.  PHYSICAL EXAM: General: NAD, frail appearing, thin Cardiovascular: regular rate and rhythm Pulmonary: clear ant fields Abdomen: soft, nontender, + bowel sounds GU: no suprapubic tenderness Extremities: no edema, no joint deformities Skin: no rashes Neurological: Weakness but otherwise nonfocal  IMPRESSION: I met with patient today for routine follow-up.  An interpreter was used for communication.  Patient reports feeling a progressive emotional burden and weakness associated with continued chemotherapy.  She says she was reluctant to even initiate chemotherapy and now is considering stopping treatment altogether.  I discussed this with Dr. Grayland Ormond.  Patient has sixth and final cycle of treatment scheduled next week and then would receive reimaging.  Patient says she wants to come next week with her daughter and speak with Dr. Grayland Ormond.  She asked if she could forego the last treatment and proceed with reimaging.  I asked her if the imaging showed improvement in disease if she would be willing to consider further chemotherapy but she was unsure.  Patient says her primary goal is comfort and quality of life.  She feels she has not been able to participate in family activities as she once did and would like to focus on her family for whatever time she has left.  We did discuss the option of hospice at home, should patient opt to forego future treatment.  Symptomatically, patient reports that her pain is stable.  She was rotated off oxycodone to tramadol due to nausea.  Patient denies any recent nausea or  pain.  Her oral intake remains poor.  Weight continues to downtrend.  Weight today is 154 pounds and is down from 158 pounds 2 weeks ago.  We again discussed smaller more frequent meals throughout the day with a focus on high-protein and high-calorie foods.  She is drinking supplements intermittently and I recommended that she increase those to 2 or 3 times a day.  MOST Form was previously reviewed with patient and she says she is completed 1 at home and will bring it in next week to be signed.  PLAN: -Follow-up appointment next week with Dr. Grayland Ormond to discuss -Would recommend hospice at home if patient opts to forego future treatment -MOST Form discussed and patient will bring next week -RTC as needed   Patient expressed understanding and was in agreement with this plan. She also understands that She can call clinic at any time with any questions, concerns, or complaints.     Time Total: 45 minutes  Visit consisted of counseling and education dealing with the complex and emotionally intense issues of symptom management and palliative care in the setting of serious and potentially life-threatening illness.Greater than 50%  of this time was spent counseling and coordinating care related to the above assessment and plan.  Signed by: Altha Harm, PhD, NP-C [phone] (Work Cell)

## 2018-10-02 ENCOUNTER — Inpatient Hospital Stay: Payer: Self-pay

## 2018-10-02 ENCOUNTER — Inpatient Hospital Stay: Payer: Self-pay | Admitting: Oncology

## 2018-10-30 ENCOUNTER — Inpatient Hospital Stay: Payer: Self-pay

## 2018-10-30 ENCOUNTER — Inpatient Hospital Stay: Payer: Self-pay | Admitting: Oncology

## 2018-10-30 NOTE — Progress Notes (Deleted)
Elizabeth Davenport  [phone]) [phone] [phone]) [phone]  ID: Elizabeth Davenport OB: 04/18/1956  MR#: [id number]  [id number]#:[id number]  Patient Care Team: Letta Median, MD as PCP - General (Family Medicine) Clent Jacks, RN as Registered Nurse Rico Junker, RN as Registered Nurse Theodore Demark, RN as Registered Nurse  CHIEF COMPLAINT: Progressive low-grade ovarian malignancy  INTERVAL HISTORY: Patient returns to clinic today for further evaluation and consideration of cycle 5 of carboplatinum and Taxol.  She was in the ER earlier this week with abdominal pain and bloating.  She underwent CT scan which showed mildly progressive disease as well as increased ascites.  Paracentesis only was able to remove a minimal amount of fluid.  Her pain has improved.  She has chronic weakness and fatigue. She has no neurologic complaints.  She denies any fevers.  She has a fair appetite and denies weight loss.  She has no chest pain or shortness of breath.  She denies any nausea, vomiting, constipation, or diarrhea.  She has no urinary complaints.  Patient offers no further specific complaints today.  REVIEW OF SYSTEMS:   Review of Systems  Constitutional: Positive for malaise/fatigue. Negative for fever and weight loss.  Respiratory: Negative.  Negative for cough and shortness of breath.   Cardiovascular: Negative.  Negative for chest pain and leg swelling.  Gastrointestinal: Positive for abdominal pain. Negative for constipation, diarrhea, heartburn, nausea and vomiting.  Genitourinary: Negative.  Negative for dysuria.  Musculoskeletal: Negative.  Negative for back pain.  Skin: Negative.  Negative for rash.  Neurological: Positive for weakness. Negative for dizziness, sensory change, focal weakness and headaches.  Psychiatric/Behavioral: Negative.  The patient is not nervous/anxious.     As per HPI. Otherwise, a complete review of systems is negative.  PAST MEDICAL  HISTORY: Past Medical History:  Diagnosis Date  . Arthritis    left knee  . Asthma   . Endometrial cancer ([place]) 11/04/14  . PONV (postoperative nausea and vomiting)     PAST SURGICAL HISTORY: Past Surgical History:  Procedure Laterality Date  . ABDOMINAL HYSTERECTOMY    . LAPAROSCOPIC BILATERAL SALPINGO OOPHERECTOMY Bilateral 11/04/2014   Procedure: LAPAROSCOPIC BILATERAL SALPINGO OOPHORECTOMY;  Surgeon: Mellody Drown, MD;  Location: [facility] ORS;  Service: Gynecology;  Laterality: Bilateral;  . LAPAROSCOPIC HYSTERECTOMY N/A 11/04/2014   Procedure: HYSTERECTOMY TOTAL LAPAROSCOPIC;  Surgeon: Mellody Drown, MD;  Location: [facility] OR[facility];  Service: Gynecology;  Laterality: N/A;  . removed mass from thyriod      FAMILY HISTORY: Family History  Problem Relation Age of Onset  . Breast cancer Sister 30  . Asthma Mother   . Coronary artery disease Mother   . Diabetes Brother   . Diabetes Sister   . Diabetes Sister   . Diabetes Brother     ADVANCED DIRECTIVES (Y/N):  N  HEALTH MAINTENANCE: Social History   Tobacco Use  . Smoking status: Former Smoker    Packs/day: 0.25    Years: 8.00    Pack years: 2.00    Types: Cigarettes    Last attempt to quit: 06/29/1990    Years since quitting: 28.3  . Smokeless tobacco: Never Used  . Tobacco comment: Smoking History Cigarettes 1 cig per day; quit 22 years ago; smoked for 8 years  Substance Use Topics  . Alcohol use: No    Alcohol/week: 0.0 standard drinks    Comment: occasional   . Drug use: No     Colonoscopy:  PAP:  Bone density:  Lipid  panel:  No Known Allergies  Current Outpatient Medications  Medication Sig Dispense Refill  . acetaminophen (TYLENOL) 500 MG tablet Take 500 mg by mouth every 6 (six) hours as needed.    Marland Kitchen albuterol (PROVENTIL HFA;VENTOLIN HFA) 108 (90 BASE) MCG/ACT inhaler Inhale 2 puffs into the lungs every 6 (six) hours as needed for wheezing or shortness of breath.    . ALPRAZolam (XANAX) 0.25 MG tablet Take  1 tablet (0.25 mg total) by mouth 2 (two) times daily as needed for anxiety. 60 tablet 1  . exemestane (AROMASIN) 25 MG tablet Take 1 tablet (25 mg total) by mouth daily after breakfast. (Patient not taking: Reported on 08/14/2018) 30 tablet 3  . fluticasone (FLOVENT HFA) 220 MCG/ACT inhaler Take by mouth.    . gabapentin (NEURONTIN) 300 MG capsule Take 1 capsule (300 mg total) by mouth 2 (two) times daily. Neuropathic pain (Patient not taking: Reported on 09/25/2018) 30 capsule 0  . metoCLOPramide (REGLAN) 10 MG tablet Take 0.5 tablets (5 mg total) by mouth 4 (four) times daily -  before meals and at bedtime. 60 tablet 1  . ondansetron (ZOFRAN) 8 MG tablet Take 1 tablet (8 mg total) by mouth 2 (two) times daily as needed for refractory nausea / vomiting. 30 tablet 2  . pantoprazole (PROTONIX) 40 MG tablet Take 1 tablet (40 mg total) by mouth daily. For acid reflux 90 tablet 3  . polyethylene glycol (MIRALAX) packet Take 17 g by mouth daily. 14 each 0  . prochlorperazine (COMPAZINE) 10 MG tablet Take 1 tablet (10 mg total) by mouth every 6 (six) hours as needed (Nausea or vomiting). 60 tablet 2  . senna-docusate (SENNA S) 8.6-50 MG tablet Take 1 tablet by mouth daily. 30 tablet 0  . traMADol (ULTRAM) 50 MG tablet Take 1 tablet (50 mg total) by mouth every 6 (six) hours as needed for moderate pain or severe pain. (Patient not taking: Reported on 09/25/2018) 60 tablet 0   No current facility-administered medications for this visit.     OBJECTIVE: There were no vitals filed for this visit.   There is no height or weight on file to calculate BMI.    ECOG FS:0 - Asymptomatic  General: Well-developed, well-nourished, no acute distress. Eyes: Pink conjunctiva, anicteric sclera. HEENT: Normocephalic, moist mucous membranes, clear oropharnyx. Lungs: Clear to auscultation bilaterally. Heart: Regular rate and rhythm. No rubs, murmurs, or gallops. Abdomen: Soft, nondistended, mildly tender. Musculoskeletal:  No edema, cyanosis, or clubbing. Neuro: Alert, answering all questions appropriately. Cranial nerves grossly intact. Skin: No rashes or petechiae noted. Psych: Normal affect.  LAB RESULTS:  Lab Results  Component Value Date   NA 131 (L) 09/06/2018   K 3.4 (L) 09/06/2018   CL 95 (L) 09/06/2018   CO2 21 (L) 09/06/2018   GLUCOSE 152 (H) 09/06/2018   [place] 12 09/06/2018   CREATININE 0.31 (L) 09/06/2018   CALCIUM 8.3 (L) 09/06/2018   PROT 8.1 09/06/2018   ALBUMIN 2.6 (L) 09/06/2018   AST 45 (H) 09/06/2018   ALT 47 (H) 09/06/2018   ALKPHOS 127 (H) 09/06/2018   BILITOT 1.8 (H) 09/06/2018   GFRNONAA >60 09/06/2018   GFRAA >60 09/06/2018    Lab Results  Component Value Date   WBC 7.2 09/06/2018   NEUTROABS 6.7 09/04/2018   HGB 8.4 (L) 09/06/2018   HCT 27.3 (L) 09/06/2018   MCV 83.5 09/06/2018   PLT 318 09/06/2018     STUDIES: No results found.  ASSESSMENT: Progressive low-grade ovarian  malignancy. PLAN:   1.  Progressive low-grade ovarian malignancy: CT scan results from September 01, 2018 reviewed independently and report as above with mild prominence of mesenteric and retroperitoneal lymph nodes unchanged as well as increased prominence of pelvic lymph nodes possibly representing progression of disease.  Her CA-125 is increased, but stable.  At worst, CT scan is equivocal therefore will continue on with current treatment.  Previously, patient underwent optimal surgical debulking and then completed 3 cycles of adjuvant carboplatinum and Taxol on February 26, 2018.  Patient was initially placed on maintenance letrozole, but could not tolerate treatment and this was switched to Aromasin.  Proceed with cycle 5 of carboplatinum and Taxol today.  Return to clinic in 3 weeks for further evaluation and consideration of cycle 6.  Plan to reimage again at the conclusion of cycle 6.         2.  History of endometrial cancer: Patient had total hysterectomy.  Imaging as above. 3.  Anemia:  Patient's hemoglobin has improved to 8.8.  Proceed with IV Feraheme today.   4.  Leukopenia: Resolved.   5.  Anxiety: Continue Xanax as needed.  Patient was given a refill today. 6.  Indigestion: Continue Protonix as prescribed. 7.  Elevated ferritin: Likely secondary to acute phase reactant secondary to malignancy.  Monitor. 8.  Disposition: Hospice and end-of-life care were briefly discussed again today.  Appreciate palliative care input.  We will proceed with 2 additional treatments as above and then readdress after imaging.  The entire visit was done in the presence of an interpreter.  Patient expressed understanding and was in agreement with this plan. She also understands that She can call clinic at any time with any questions, concerns, or complaints.   Cancer Staging Borderline epithelial neoplasm of ovary Staging form: Ovary, Fallopian Tube, and Primary Peritoneal Carcinoma, AJCC 8th Edition - Clinical stage from 01/11/2018: FIGO Stage III (cT3, cN0, cM0) - Signed by Lloyd Huger, MD on 01/11/2018  Endometrial cancer [facility]) Staging form: Corpus Uteri - Carcinoma, AJCC 7th Edition - Clinical: Stage I (T1, N0, M0) - Unsigned  Ovarian carcinoma, unspecified laterality ([place]) Staging form: Ovary, AJCC 7th Edition - Clinical: Stage Unknown (T1c, NX, M0) - Signed by Forest Gleason, MD on 11/27/2014   Lloyd Huger, MD   10/30/2018 6:32 AM

## 2018-11-04 ENCOUNTER — Inpatient Hospital Stay: Payer: Self-pay

## 2018-11-04 ENCOUNTER — Telehealth: Payer: Self-pay | Admitting: *Deleted

## 2018-11-04 ENCOUNTER — Other Ambulatory Visit: Payer: Self-pay

## 2018-11-04 ENCOUNTER — Ambulatory Visit: Payer: Self-pay

## 2018-11-04 ENCOUNTER — Inpatient Hospital Stay: Payer: Self-pay | Attending: Nurse Practitioner | Admitting: Nurse Practitioner

## 2018-11-04 VITALS — BP 106/77 | HR 75 | Temp 99.3°F | Resp 18

## 2018-11-04 DIAGNOSIS — R180 Malignant ascites: Secondary | ICD-10-CM | POA: Insufficient documentation

## 2018-11-04 DIAGNOSIS — M129 Arthropathy, unspecified: Secondary | ICD-10-CM | POA: Insufficient documentation

## 2018-11-04 DIAGNOSIS — R5383 Other fatigue: Secondary | ICD-10-CM | POA: Insufficient documentation

## 2018-11-04 DIAGNOSIS — C786 Secondary malignant neoplasm of retroperitoneum and peritoneum: Secondary | ICD-10-CM | POA: Insufficient documentation

## 2018-11-04 DIAGNOSIS — R112 Nausea with vomiting, unspecified: Secondary | ICD-10-CM

## 2018-11-04 DIAGNOSIS — R1030 Lower abdominal pain, unspecified: Secondary | ICD-10-CM

## 2018-11-04 DIAGNOSIS — R140 Abdominal distension (gaseous): Secondary | ICD-10-CM

## 2018-11-04 DIAGNOSIS — C561 Malignant neoplasm of right ovary: Secondary | ICD-10-CM | POA: Insufficient documentation

## 2018-11-04 DIAGNOSIS — R7989 Other specified abnormal findings of blood chemistry: Secondary | ICD-10-CM | POA: Insufficient documentation

## 2018-11-04 DIAGNOSIS — C569 Malignant neoplasm of unspecified ovary: Secondary | ICD-10-CM

## 2018-11-04 DIAGNOSIS — Z90722 Acquired absence of ovaries, bilateral: Secondary | ICD-10-CM | POA: Insufficient documentation

## 2018-11-04 DIAGNOSIS — Z9221 Personal history of antineoplastic chemotherapy: Secondary | ICD-10-CM | POA: Insufficient documentation

## 2018-11-04 DIAGNOSIS — D649 Anemia, unspecified: Secondary | ICD-10-CM | POA: Insufficient documentation

## 2018-11-04 DIAGNOSIS — Z79899 Other long term (current) drug therapy: Secondary | ICD-10-CM | POA: Insufficient documentation

## 2018-11-04 DIAGNOSIS — K219 Gastro-esophageal reflux disease without esophagitis: Secondary | ICD-10-CM | POA: Insufficient documentation

## 2018-11-04 DIAGNOSIS — Z79811 Long term (current) use of aromatase inhibitors: Secondary | ICD-10-CM | POA: Insufficient documentation

## 2018-11-04 DIAGNOSIS — Z90710 Acquired absence of both cervix and uterus: Secondary | ICD-10-CM | POA: Insufficient documentation

## 2018-11-04 DIAGNOSIS — Z87891 Personal history of nicotine dependence: Secondary | ICD-10-CM | POA: Insufficient documentation

## 2018-11-04 DIAGNOSIS — R531 Weakness: Secondary | ICD-10-CM | POA: Insufficient documentation

## 2018-11-04 DIAGNOSIS — C562 Malignant neoplasm of left ovary: Secondary | ICD-10-CM | POA: Insufficient documentation

## 2018-11-04 DIAGNOSIS — D3910 Neoplasm of uncertain behavior of unspecified ovary: Secondary | ICD-10-CM

## 2018-11-04 DIAGNOSIS — R599 Enlarged lymph nodes, unspecified: Secondary | ICD-10-CM | POA: Insufficient documentation

## 2018-11-04 DIAGNOSIS — J45909 Unspecified asthma, uncomplicated: Secondary | ICD-10-CM | POA: Insufficient documentation

## 2018-11-04 DIAGNOSIS — Z8542 Personal history of malignant neoplasm of other parts of uterus: Secondary | ICD-10-CM | POA: Insufficient documentation

## 2018-11-04 DIAGNOSIS — Z7189 Other specified counseling: Secondary | ICD-10-CM

## 2018-11-04 DIAGNOSIS — F419 Anxiety disorder, unspecified: Secondary | ICD-10-CM | POA: Insufficient documentation

## 2018-11-04 DIAGNOSIS — Z803 Family history of malignant neoplasm of breast: Secondary | ICD-10-CM | POA: Insufficient documentation

## 2018-11-04 LAB — CBC WITH DIFFERENTIAL/PLATELET
Abs Immature Granulocytes: 0.01 10*3/uL (ref 0.00–0.07)
Basophils Absolute: 0 10*3/uL (ref 0.0–0.1)
Basophils Relative: 0 %
Eosinophils Absolute: 0 10*3/uL (ref 0.0–0.5)
Eosinophils Relative: 0 %
HCT: 28.2 % — ABNORMAL LOW (ref 36.0–46.0)
Hemoglobin: 8.4 g/dL — ABNORMAL LOW (ref 12.0–15.0)
Immature Granulocytes: 0 %
Lymphocytes Relative: 17 %
Lymphs Abs: 0.9 10*3/uL (ref 0.7–4.0)
MCH: 26 pg (ref 26.0–34.0)
MCHC: 29.8 g/dL — ABNORMAL LOW (ref 30.0–36.0)
MCV: 87.3 fL (ref 80.0–100.0)
Monocytes Absolute: 0.6 10*3/uL (ref 0.1–1.0)
Monocytes Relative: 12 %
Neutro Abs: 3.6 10*3/uL (ref 1.7–7.7)
Neutrophils Relative %: 71 %
Platelets: 481 10*3/uL — ABNORMAL HIGH (ref 150–400)
RBC: 3.23 MIL/uL — ABNORMAL LOW (ref 3.87–5.11)
RDW: 19.1 % — ABNORMAL HIGH (ref 11.5–15.5)
WBC: 5.1 10*3/uL (ref 4.0–10.5)
nRBC: 0 % (ref 0.0–0.2)

## 2018-11-04 LAB — FERRITIN: Ferritin: 1780 ng/mL — ABNORMAL HIGH (ref 11–307)

## 2018-11-04 LAB — COMPREHENSIVE METABOLIC PANEL
ALT: 11 U/L (ref 0–44)
AST: 20 U/L (ref 15–41)
Albumin: 2.1 g/dL — ABNORMAL LOW (ref 3.5–5.0)
Alkaline Phosphatase: 73 U/L (ref 38–126)
Anion gap: 11 (ref 5–15)
BUN: 6 mg/dL — ABNORMAL LOW (ref 8–23)
CO2: 26 mmol/L (ref 22–32)
Calcium: 7.8 mg/dL — ABNORMAL LOW (ref 8.9–10.3)
Chloride: 95 mmol/L — ABNORMAL LOW (ref 98–111)
Creatinine, Ser: 0.36 mg/dL — ABNORMAL LOW (ref 0.44–1.00)
GFR calc Af Amer: >60 mL/min (ref >60)
GFR calc non Af Amer: >60 mL/min (ref >60)
Glucose, Bld: 108 mg/dL — ABNORMAL HIGH (ref 70–99)
Potassium: 3.6 mmol/L (ref 3.5–5.1)
Sodium: 132 mmol/L — ABNORMAL LOW (ref 135–145)
Total Bilirubin: 0.5 mg/dL (ref 0.3–1.2)
Total Protein: 7.6 g/dL (ref 6.5–8.1)

## 2018-11-04 LAB — IRON AND TIBC
Iron: 13 ug/dL — ABNORMAL LOW (ref 28–170)
Saturation Ratios: 12 % (ref 10.4–31.8)
TIBC: 108 ug/dL — ABNORMAL LOW (ref 250–450)
UIBC: 95 ug/dL

## 2018-11-04 MED ORDER — MORPHINE SULFATE 2 MG/ML IJ SOLN
2.0000 mg | Freq: Once | INTRAMUSCULAR | Status: AC
  Administered 2018-11-04: 12:00:00 2 mg via INTRAMUSCULAR
  Filled 2018-11-04: qty 1

## 2018-11-04 MED ORDER — MORPHINE SULFATE 2 MG/ML IJ SOLN: 2.0000 mg | Freq: Once | INTRAMUSCULAR | Status: DC

## 2018-11-04 NOTE — Progress Notes (Signed)
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  Reports menopause in 2014.  Dr. Enzo Bi did a Pap that was normal.  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  Focally ER PR negative.  CT scan on 09/22/2014 abdomen/pelvis negative for metastatic disease.  She was referred to gynecologic and exam of cervix was normal.  Slides were reviewed at [facility] and read as probable endometrial primary.  She underwent TLH-BSO on 11/04/2014 at [facility].  Right ovarian excresence noted at surgery.  Survey of the rest of the abdomen, including omentum, and right diaphragm was negative.  Further staging was not performed due to patient's refusal to accept blood products that she is a Jehovah's Witness.  Pathology at [facility] showed a 2.6 cm grade 1 endometrial cancer without LVSI or cervical involvement.  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  No spread noted but washings were positive.  In view of the path report, suggestive of two primaries, pathology was reviewed at [facility].  The review confirmed that the uterine tumor is in early grade 1 endometrial adenocarcinoma.  However the ovarian excresences (were read as low-grade serous carcinoma) were interpreted as serous borderline tumors by [facility] GYN pathologists  (multiple).  The recommendation was for surveillance.  CA 125 05/05/2015 8.1 11/03/2015 12.7 05/10/2016 16.2 11/08/2016 15.4 05/23/2017 30.6 07/09/2017 25.2  On 10/31/2017 she presented to [facility] complaining of left lower quadrant abdominal pain which showed interval development of omental and peritoneal implants/metastatic disease and small amount of ascites with multiple small nodularities in the left upper quadrant including the left hemidiaphragm consistent with peritoneal implants.  Mildly rounded nodular density/lymph nodes at the cardiophrenic angles new and concerning for metastatic disease.  CT-guided biopsy performed. Pathology: A.  Omentum, left anterior abdomen-positive for involvement by epithelial neoplasm of gynecologic origin  11/14/2017 204.4  12/13/2017-underwent diagnostic laparoscopy, omentectomy, optimally debulked, for removal of mass.  Pathology-low-grade serous carcinoma in background of implants of serous borderline tumor.  Positive cytology ' suspicious for malignancy'.  Recommended to start chemotherapy followed by hormonal therapy.    01/22/2018 322.0 02/05/2018 314.0 02/26/2018 75.7  She completed 3 cycles of adjuvant carboplatin-Taxol on 02/26/2018.  Was started on letrozole, but on 04/01/2018 call the clinic and refused to take more due to bone and low back pain.  Plan was to washout and start anastrozole.  Trametinib was discussed if progressive disease.   03/19/2018 22.1  Presented to ER on 04/05/2018 complaining of low back pain and urinary frequency.  She was treated for possible UTI.  CT scan showed diffuse nodular infiltration throughout the mesentery and omentum consistent with peritoneal carcinomatosis. Started anastrozole.   04/10/2018 93.8 06/03/2018 134.0  Seen in symptom management clinic for abdominal swelling and discomfort.  Underwent paracentesis which yielded 1.9 L of fluid on 06/04/2018.Marland Kitchen  Fluid positive for malignancy.  CT chest/abdomen/pelvis  on  06/06/2018 showed progression.  CT chest/abdomen/pelvis scan 06/06/2018 showed progression.  Patient required paracentesis.  Fluid positive for malignant cells.     Ovarian carcinoma, unspecified laterality ([place])   11/25/2014 Initial Diagnosis    Ovarian cancer, bilateral   She reinitiated adjuvant carboplatin-Taxol on 06/14/2018. Treatment complicated by weakness and anemia.  Interval history- Elizabeth Davenport, 63 year old female with above history of endometrial cancer and progressive serous borderline ovarian cancer who presents to symptom management clinic for abdominal pain. Pain started 2 days ago and has persisted since that time. Aching, occurs constantly. Pain slightly worse when she needs to have a BM, slightly improves but does not resolve with BM. Moving bowels daily. Describes as soft. Has not taken pain medication today but tooka dose of tramadol yesterday which slightly improved her pain. She says she feels bloating and like she needs fluid removed from her stomach which has helped in the past.   Last chemotherapy was on 09/04/2018. Hospice was previously discussed and recommended. She said that her primary goal was comfort and quality of life and due to her symptoms she has not been able to participate in family activities. She lives with her husband who provides her care.   Oral intake remains poor. Weight trends down. She previously completed MOST form but did not bring it to clinic for signature. Last paracentesis was 09/03/2018 which yielded 360cc of fluid. Imaging on 09/01/2018 in ER for abdominal pain showed peritoneal carcinomatosis, enlarged lymph nodes, consistent with progression of disease.   CA 125 has been followed 8.1 05/05/2015 12.7 11/03/2015 16.2 05/10/2016 15.4 11/08/2016 30.6 05/23/2017 25.2 07/09/2017 204.4 11/14/2017 322 01/22/2018 314 02/05/2018 75.7 02/26/2018 22.1 03/19/2018 93.8 04/10/2018 134 06/03/2018 95.6 07/22/2018 81.8 08/14/2018 82.9 09/02/2018  Carbo-taxol  initiated 01/15/2018. Received 8 cycles. Last on 09/04/2018. Patient was seen by palliative medicine on 09/25/2018 and hospice was discussed but deferred at that time. Since that time, patient has cancelled her appointments.   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  ECOG FS:2 - Symptomatic, <50% confined to bed  Review of systems- Review of Systems  Constitutional: Positive for malaise/fatigue. Negative for chills, diaphoresis, fever and weight loss.  HENT: Negative for congestion and sore throat.   Respiratory: Negative for cough, sputum production, shortness of breath and wheezing.   Cardiovascular: Negative for chest pain, claudication and leg swelling.  Gastrointestinal: Positive for abdominal pain, heartburn and nausea. Negative for blood in stool, constipation, diarrhea, melena and vomiting.  Genitourinary: Negative for dysuria, flank pain, frequency, hematuria and urgency.  Musculoskeletal: Negative for back pain, falls, joint pain, myalgias and neck pain.  Skin: Negative for itching and rash.  Neurological: Negative for dizziness, weakness and headaches.  Psychiatric/Behavioral: Negative for depression. The patient is not nervous/anxious and does not have insomnia.     Current treatment- not currently on treatment  No Known Allergies  Past Medical History:  Diagnosis Date   Arthritis    left knee   Asthma    Endometrial cancer ([place]) 11/04/14   PONV (postoperative nausea and vomiting)     Past Surgical History:  Procedure Laterality Date   ABDOMINAL HYSTERECTOMY     LAPAROSCOPIC BILATERAL SALPINGO OOPHERECTOMY Bilateral 11/04/2014   Procedure: LAPAROSCOPIC BILATERAL SALPINGO OOPHORECTOMY;  Surgeon: Mellody Drown, MD;  Location: [facility] ORS;  Service: Gynecology;  Laterality: Bilateral;   LAPAROSCOPIC HYSTERECTOMY N/A 11/04/2014   Procedure: HYSTERECTOMY TOTAL LAPAROSCOPIC;  Surgeon: Mellody Drown, MD;  Location: [facility] ORS;  Service: Gynecology;  Laterality: N/A;   removed mass from thyriod      Social History   Socioeconomic History   Marital status: Married    Spouse name: Not on file   Number of children: Not on file   Years of education: Not on file   Highest education level: Not on file  Occupational History   Not on file  Social Needs   Financial resource strain: Not on file   Food insecurity:    Worry: Not on file    Inability: Not on file   Transportation needs:    Medical: Not on file    Non-medical: Not on file  Tobacco Use   Smoking status: Former Smoker    Packs/day: 0.25    Years: 8.00    Pack years: 2.00    Types: Cigarettes    Last attempt to quit: 06/29/1990    Years since quitting: 28.3   Smokeless tobacco: Never Used   Tobacco comment: Smoking History Cigarettes 1 cig per day; quit 22 years ago; smoked for 8 years  Substance and Sexual Activity   Alcohol use: No    Alcohol/week: 0.0 standard drinks    Comment: occasional    Drug use: No   Sexual activity: Yes    Partners: Male  Lifestyle   Physical activity:    Days per week: Not on file    Minutes per session: Not on file   Stress: Not on file  Relationships   Social connections:    Talks on phone: Not on file    Gets together: Not on file    Attends religious service: Not on file    Active member of club or organization: Not on file    Attends meetings of clubs or organizations: Not on file    Relationship status: Not on file   Intimate partner violence:    Fear of current or ex partner: Not on file    Emotionally abused: Not on file    Physically abused: Not on file    Forced sexual activity: Not on file  Other Topics Concern   Not on file  Social History Narrative   Not on file    Family History  Problem Relation Age of Onset   Breast cancer Sister 46   Asthma Mother    Coronary artery disease Mother    Diabetes Brother    Diabetes Sister    Diabetes Sister    Diabetes Brother       Current Outpatient Medications:    acetaminophen (TYLENOL) 500 MG tablet, Take 500 mg by mouth every 6 (six) hours as needed., Disp: , Rfl:    albuterol (PROVENTIL HFA;VENTOLIN HFA) 108 (90 BASE) MCG/ACT inhaler, Inhale 2 puffs into the lungs every 6 (six) hours as needed for wheezing or shortness of breath., Disp: , Rfl:    ALPRAZolam (XANAX) 0.25 MG tablet, Take 1 tablet (0.25 mg total) by mouth 2 (two) times daily as needed for anxiety., Disp: 60 tablet, Rfl: 1   CONSTULOSE 10 GM/15ML solution, TAKE 30ML BY MOUTH TWICE DAILY FOR 5 DAYS, Disp: , Rfl:    exemestane (AROMASIN) 25 MG tablet, Take 1 tablet (25 mg total) by mouth daily after breakfast. (Patient not taking: Reported on 08/14/2018), Disp: 30 tablet, Rfl: 3   fluticasone (FLOVENT HFA) 220 MCG/ACT inhaler, Take by mouth., Disp: , Rfl:    gabapentin (NEURONTIN) 300 MG capsule, Take 1 capsule (300 mg total) by mouth 2 (two) times daily. Neuropathic pain (Patient not  taking: Reported on 09/25/2018), Disp: 30 capsule, Rfl: 0   metoCLOPramide (REGLAN) 10 MG tablet, Take 0.5 tablets (5 mg total) by mouth 4 (four) times daily -  before meals and at bedtime., Disp: 60 tablet, Rfl: 1   ondansetron (ZOFRAN) 8 MG tablet, Take 1 tablet (8 mg total) by mouth 2 (two) times daily as needed for refractory nausea / vomiting., Disp: 30 tablet, Rfl: 2   pantoprazole (PROTONIX) 40 MG tablet, Take 1 tablet (40 mg total) by mouth daily. For acid reflux, Disp: 90 tablet, Rfl: 3   polyethylene glycol (MIRALAX) packet, Take 17 g by mouth daily., Disp: 14 each, Rfl: 0   prochlorperazine (COMPAZINE) 10 MG tablet, Take 1 tablet (10 mg total) by mouth every 6 (six) hours as needed (Nausea or vomiting)., Disp: 60 tablet, Rfl: 2   senna-docusate (SENNA S) 8.6-50 MG tablet, Take 1 tablet by mouth daily., Disp: 30 tablet, Rfl: 0   traMADol (ULTRAM) 50 MG tablet, Take 1 tablet (50 mg total) by mouth every 6 (six) hours as needed for moderate pain or severe  pain., Disp: 60 tablet, Rfl: 0  Current Facility-Administered Medications:    morphine 2 MG/ML injection 2 mg, 2 mg, Intramuscular, Once, Verlon Au, NP  Physical exam:  Vitals:   11/04/18 1254  BP: 106/77  Pulse: 75  Resp: 18  Temp: 99.3 F (37.4 C)  TempSrc: Tympanic    Physical Exam Constitutional:      General: She is not in acute distress.    Appearance: She is well-developed. She is not ill-appearing.     Comments: Painful appearing; standing up. In exam room.   HENT:     Head: Normocephalic and atraumatic.     Comments: alopecia    Mouth/Throat:     Mouth: Mucous membranes are moist.     Pharynx: Oropharynx is clear.  Eyes:     General: No scleral icterus.    Conjunctiva/sclera: Conjunctivae normal.  Cardiovascular:     Rate and Rhythm: Normal rate and regular rhythm.  Pulmonary:     Effort: Pulmonary effort is normal. No respiratory distress.     Breath sounds: Normal breath sounds.  Abdominal:     General: Bowel sounds are normal. There is distension.     Palpations: Abdomen is soft.     Tenderness: There is abdominal tenderness (diffusely tender; no rebound. ).     Comments: Rounded abdomen.   Skin:    General: Skin is warm and dry.  Neurological:     Mental Status: She is oriented to person, place, and time.     Gait: Gait normal.  Psychiatric:        Mood and Affect: Mood is anxious.        Behavior: Behavior normal.        Thought Content: Thought content normal.      CMP Latest Ref Rng & Units 11/04/2018  Glucose 70 - 99 mg/dL 108(H)  BUN 8 - 23 mg/dL 6(L)  Creatinine 0.44 - 1.00 mg/dL 0.36(L)  Sodium 135 - 145 mmol/L 132(L)  Potassium 3.5 - 5.1 mmol/L 3.6  Chloride 98 - 111 mmol/L 95(L)  CO2 22 - 32 mmol/L 26  Calcium 8.9 - 10.3 mg/dL 7.8(L)  Total Protein 6.5 - 8.1 g/dL 7.6  Total Bilirubin 0.3 - 1.2 mg/dL 0.5  Alkaline Phos 38 - 126 U/L 73  AST 15 - 41 U/L 20  ALT 0 - 44 U/L 11   CBC Latest Ref Rng &  Units 11/04/2018  WBC 4.0 -  10.5 K/uL 5.1  Hemoglobin 12.0 - 15.0 g/dL 8.4(L)  Hematocrit 36.0 - 46.0 % 28.2(L)  Platelets 150 - 400 K/uL 481(H)    No images are attached to the encounter.  No results found.  Assessment and plan- Patient is a 63 y.o. female diagnosed with recurrent low-grade serous ovarian cancer who presents to symptom management clinic for abdominal pain.  1.  Recurrent low-grade serous cancer -diagnosed with stage Ia grade 1 endometrioid endometrial cancer and concomitant small bilateral ovarian borderline serous tumor excrescences with positive washings status post TLH BSO on 11/04/2014.  Diagnosed with low-grade serous cancer status post laparoscopic omentectomy 12/13/2017 with optimal debulking surgery followed by 3 cycles of carbotaxol and letrozole, switched to anastrozole due to side effects, discontinued due to side effects.  Imaging on 06/06/2018 consistent with progression.  Ca125 also elevated at that time.  Reinitiated carbo-Taxol on 06/14/2018 given good response earlier that year.  Ca1 25 improved but overall more symptomatic with abdominal bloating requiring paracentesis.  CT imaging from ER in March overall equivocal/stable and continuing chemotherapy was recommended but patient stopped treatment; last on 09/04/2018 and has canceled appointments since then.  Hospice was recommended but not initiated. CA 125 today increased to 138.0 and symptomatic. She would like to see Dr. Theora Gianotti for discussion of treatment options vs. Hospice.   2.  Abdominal pain-etiology unclear however suspect recurrent malignant ascites versus progressive malignancy associated pain.  Morphine 2 mg IM given in clinic today with improvement in pain. Start oxycodone 2.5 mg - 5 mg every 4 hours as needed for pain.  Discussed risk of opioid-induced constipation and side effects of opioid medications.  Recommended OTC bowel regimen with goal of 1 bowel movement every to every other day without pain or straining.  Advised not to  drive while taking this medication.   3. Malignant Ascites-paracentesis on 11/05/2018 yielded 300 cc of fluid.  No albumin given due to refusal of blood products due to religious believes though her serum albumin is 2.1.  Last paracentesis in March.  Given low yield and infrequency I do not feel she would benefit significantly from catheter placement at this time.  Continue to monitor.   4. Goals of Care- We again discussed the incurable nature of her disease. She has previously declined chemotherapy and hormonal therapy but today says she would like to see gyn-onc, Dr. Theora Gianotti to discuss treatments. She says she is most concerned with maintaining quality of life and is concerned of side effects. I introduced community-based palliative care services which I recommended for her as I feel she would benefit from ongoing and more comprehensive management of symptoms associated with her malignancy and advance care planning.  Should she elect not to receive treatment, I recommend hospice.  She is agreeable to palliative care referral today and would consider hospice in the future.   5. Anemia- hemoglobin 8.4. No bleeding.  Due to religious believes, patient refuses blood products. Iron Studies today. Ferritin 1780. TIBC 108, Sat 12. Consider follow up with Dr. Grayland Ormond if she elects to continue treatment.    6. Thrombocytosis-platelets 481, increased.  Likely reactive. Continue to monitor.   Due to language barrier, an interpreter was utilized for all patient interactions.   Refer to community-based palliative care.  Paracentesis on 11/05/2018.  Follow-up with Dr. Theora Gianotti on 11/06/2018.  Follow-up with Dr. Grayland Ormond if patient elects to continue treatment.  Visit Diagnosis 1. Ovarian carcinoma, unspecified laterality ([place])  2. Lower abdominal pain   3. Malignant ascites   4. Goals of care, counseling/discussion     Patient expressed understanding and was in agreement with this plan. She also understands  that She can call clinic at any time with any questions, concerns, or complaints.   Thank you for allowing me to participate in the care of this very pleasant patient.   Time Total: 55 minutes  Visit consisted of counseling and education dealing with the complex and emotionally intense issues of symptom management and palliative care in the setting of serious and potentially life-threatening illness.Greater than 50%  of this time was spent counseling and coordinating care related to the above assessment and plan.  Beckey Rutter, DNP, AGNP-C [facility] [place] (work cell) [phone] (office)  CC: Dr. Grayland Ormond

## 2018-11-04 NOTE — Telephone Encounter (Signed)
Daughter called reporting that patient is having abdominal pain, bloating, nausea and some vomiting. Pain is worse in lower abdominal when she sits. She is not eating much either. Her last BM was 2 days ago, but daughter does not know consistency of stool. She has a history of constipation, Daughter states she is not with patient right now, and does not know how frequently she is having BM. She is not running fever. Daughter is concerned fluid may be building up in her abdominal again. Please advise

## 2018-11-04 NOTE — Telephone Encounter (Signed)
[facility] please 

## 2018-11-04 NOTE — Telephone Encounter (Signed)
Appointment accepted for 1015 Interpreter requested with assistance from [facility]. Labs added and Lab informed of patient coming in and to draw the iron studiesas well as the CBC, CMP, CA 125

## 2018-11-05 ENCOUNTER — Ambulatory Visit
Admission: RE | Admit: 2018-11-05 | Discharge: 2018-11-05 | Disposition: A | Discharge status: Home / Self Care | Payer: Self-pay | Source: Ambulatory Visit | Attending: Nurse Practitioner | Admitting: Nurse Practitioner

## 2018-11-05 DIAGNOSIS — R180 Malignant ascites: Secondary | ICD-10-CM | POA: Insufficient documentation

## 2018-11-05 DIAGNOSIS — Z7189 Other specified counseling: Secondary | ICD-10-CM | POA: Insufficient documentation

## 2018-11-05 DIAGNOSIS — R1030 Lower abdominal pain, unspecified: Secondary | ICD-10-CM | POA: Insufficient documentation

## 2018-11-05 LAB — CA 125, SERUM (SERIAL): Cancer Antigen (CA) 125: 138 U/mL — ABNORMAL HIGH (ref 0.0–38.1)

## 2018-11-05 MED ORDER — OXYCODONE HCL 5 MG PO TABS: 2.5000 mg | ORAL_TABLET | ORAL | 0 refills | Status: DC | PRN

## 2018-11-06 ENCOUNTER — Other Ambulatory Visit: Payer: Self-pay

## 2018-11-06 ENCOUNTER — Telehealth: Payer: Self-pay | Admitting: Student

## 2018-11-06 ENCOUNTER — Telehealth: Payer: Self-pay | Admitting: Pharmacist

## 2018-11-06 ENCOUNTER — Inpatient Hospital Stay (HOSPITAL_BASED_OUTPATIENT_CLINIC_OR_DEPARTMENT_OTHER): Payer: Self-pay | Admitting: Obstetrics and Gynecology

## 2018-11-06 VITALS — BP 115/77 | HR 112 | Temp 97.9°F | Resp 20 | Ht 61.0 in | Wt 152.0 lb

## 2018-11-06 DIAGNOSIS — Z90722 Acquired absence of ovaries, bilateral: Secondary | ICD-10-CM

## 2018-11-06 DIAGNOSIS — Z9221 Personal history of antineoplastic chemotherapy: Secondary | ICD-10-CM

## 2018-11-06 DIAGNOSIS — Z90710 Acquired absence of both cervix and uterus: Secondary | ICD-10-CM

## 2018-11-06 DIAGNOSIS — C569 Malignant neoplasm of unspecified ovary: Secondary | ICD-10-CM

## 2018-11-06 NOTE — Telephone Encounter (Signed)
Oral Oncology Pharmacist Encounter  Received new prescription for Mekinist (trametinib) for the treatment of Recurrent low grade serous ovarian cancer, planned duration until disease progression or unacceptable drug toxicity.  Trametinib (Mekinist) monotherapy has been studied as a new treatment option for women with recurrent low-grade serous ovarian cancers based on improvements in survival outcomes and response rates. NRG-GOG [id number] trial ([id number]) Abstract: https://sgo.confex.com/sgo/2020/meetingapp.cgi/Paper/17159  CMP from 11/04/18 assessed, no relevant lab abnormalities. Prescription dose and frequency assessed.   Current medication list in Epic reviewed, no DDIs with trametinib identified.  Patient is uninsured and will need to be signed up for manufacturer assistance.  Oral Oncology Clinic will continue to follow for manufacturer assistance approval, initial counseling and start date.  Darl Pikes, PharmD, BCPS, [facility] Hematology/Oncology Clinical Pharmacist [facility] [phone]  11/06/2018 4:20 PM

## 2018-11-06 NOTE — Telephone Encounter (Signed)
Called patient using Spanish Interpreter and scheduled Palliative Consult for 11/11/18 @ 11 AM.

## 2018-11-06 NOTE — Telephone Encounter (Addendum)
Oral Chemotherapy Pharmacist Encounter   Attempted to call Ms. Sarchet with an interpretor to discuss signing up for manufacturer assistance. No answered, the interpretor left a message that I would try to call back tomorrow.  Darl Pikes, PharmD, BCPS, [facility] Hematology/Oncology Clinical Pharmacist [facility] Oral [facility] [phone]  11/06/2018 4:42 PM

## 2018-11-06 NOTE — Progress Notes (Signed)
Elizabeth Davenport  Referring Provider: Delight Hoh, MD  Chief Complaint: Recurrent low grade serous ovarian cancer  Subjective:  Elizabeth Davenport, 63 year old female, diagnosed with recurrent low grade serous ovarian cancer, who returns to clinic today for follow-up. Elizabeth Davenport completed 3 cycles of adjuvant carboplatin-Taxol on 02/26/18 followed by letrozole, switched to anastrozole but discontinued due to intolerance/side effects.   Imaging on 06/06/2018 showed progression.CA 125 was 134 at that time and Elizabeth Davenport reinitiated carbo-taxol on 06/14/2018 given prior response. Elizabeth Davenport completed 4 cycles and presented to ER for abdominal pain on 09/01/2018 with imaging as below:   CT 09/01/2018- CT Abdomen/Pelvis W Contrast 1. 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 Similar to previous study. 2. Mild prominence of mesenteric and retroperitoneal lymph nodes, similar to prior study. Increased prominence of pelvic lymph nodes, possibly representing progression of metastatic disease. Right cardiophrenic angle nodes seen on previous study are decreased in size. 3. No evidence of bowel obstruction or inflammation.  CA 125 was 82.9 (09/02/2018)  Elizabeth Davenport had paracentesis on 09/03/2018 which yielded 360 cc of fluid. Hospice was recommended but patient wished to continue treatment and Elizabeth Davenport received last cycle of carbo-taxol chemotherapy on 09/04/2018 for total of 5 cycles. Elizabeth Davenport was referred to palliative care for discussion of goals. Elizabeth Davenport reported considering stopping treatment and said primary goal was comfort and quality of life but Elizabeth Davenport opted to continue treatment at that time. However, Elizabeth Davenport cancelled subsequent appointments and did not receive additional chemotherapy or imaging.   Elizabeth Davenport represented to clinic on 11/04/2018 for acute abdominal pain. CA 125 was 138.0 and pain thought to be secondary to malignancy and  recurrent ascites. Elizabeth Davenport underwent paracentesis on 11/05/2018 which yielded 300cc of fluid. Elizabeth Davenport was started re-started on oxycodone which was previously discontinued due to nausea. Hospice and community based palliative care were recommended and Elizabeth Davenport accepted referral to palliative care but asked to see Elizabeth. Theora Davenport today for discussion of treatment options versus hospice at home.   Today Elizabeth Davenport reports that pain was improved with oxycodone but Elizabeth Davenport did not take medication this morning. Elizabeth Davenport reports feeling inflammed and pain is worse with eating/drinking. Elizabeth Davenport feels fatigued and reports intermittent constipation. Elizabeth Davenport has ongoing numbness in her feet since chemotherapy that doesn't interfere with her ADLs. Elizabeth Davenport is interested in starting treatment.   Oncology History: Elizabeth Davenport is a 63 y.o. G2P2 woman who developed light vaginal bleeding in early 2016 after undergoing LMP/menopause in 2014. Elizabeth Davenport did PAP that was normal.  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  CT scan 09/22/14 abd/pelvis negative for metastatic disease.  Elizabeth Davenport was referred to [place] and on exam the cervix was normal.  Slides were reviewed at [facility] and read as probable endometrial primary.  Elizabeth Davenport underwent TLH/BSO Nov 04, 2014 at [facility].  Right ovarian excresence noted at surgery.  Survey of the rest of the abdomen, including the omentum and right diaphragm was negative.  Further staging not performed due to patient's refusal to accept blood products as Elizabeth Davenport is a Jehovah's witness.  Path at [facility] showed 2.6 cm grade 1 endometrial cancer without LVSI or cervical involvement.  The ovaries both were found to have low grade serous cancers of 7 and 8 mm.  No spread noted, but washings were positive.   In view of the path report suggesting primary endometrial and uterine cancers I has the  pathology reviewed at [facility].  The review confirmed that the uterine tumor is an early grade 1 endometrial  adenocarcinoma.  However, the ovarian excresences that were read as low grade serous carcinoma were interpreted as serous borderline tumors by the [facility] Pathologists (several of them reviewed the case). The recommendation was for surveillance.   CA125 has been followed:  05/05/15 8.1 11/03/15 12.7 05/10/16 16.2 11/08/2016 15.4 05/23/17 30.6 07/09/2017 25.2 11/14/2017 204.4 01/22/2018 322.0 02/05/2018 314.0 02/26/2018 75.7 03/19/2018 22.1 04/10/2018 93.8  On 10/31/17 Elizabeth Davenport presented to [facility] ED complaining of left lower quadrant abdominal pain.  10/31/17- CT Abdomen Pelvis W Contrast- 1. Interval development of omental and peritoneal implants/metastatic disease and small amount of ascites. Multiple small nodularities in the left upper quadrant under the left hemidiaphragm (series 2, image 23 consistent with peritoneal implants.2. Mildly rounded nodular densities/lymph nodes at the cardiophrenic angles, new compared to prior CT and concerning for metastatic disease. 3. Sigmoid diverticulosis. No bowel obstruction or active inflammation. Normal appendix.  Diagnosis: A.  Omentum, left anterior abdomen; CT-guided biopsy: -Positive for involvement by epithelial neoplasm of Elizabeth origin  12/13/2017 Elizabeth Davenport underwent diagnostic laparoscopy, omentectomy for removal of mass. Pathology Low grade serous carcinoma in a background of implants of serous borderline tumor. Positive cytology "SUSPICIOUS FOR MALIGNANCY". Recommended to start chemotherapy carbo/taxol followed by hormonal therapy which Elizabeth Davenport did under the care of Elizabeth Grayland Ormond.   Recently Elizabeth Davenport went to the ER on 04/05/2018 for pain lower back, abdominal bloating, and frequent urination. Elizabeth Davenport was diagnosed with possible UTI and treated with cephalexin. CT was also ordered.   CT scan 04/05/2018  IMPRESSION: 1. No renal or ureteral stone or obstruction. 2. No evidence of bowel obstruction or inflammation. 3. Diffuse nodular infiltration throughout the  mesentery and omentum consistent with peritoneal carcinomatosis. Increased density in the pelvis likely represents free fluid related to metastatic disease, possibly with hemorrhage or proteinaceous material. 4. Prominence of retrocrural and right cardiophrenic angle lymph nodes, similar to prior study, likely metastatic. 5. Probable hepatic cysts.  Elizabeth Davenport completed chemotherapy with Elizabeth. Grayland Ormond with 3 cycles of adjuvant carboplatinum and Taxol on February 26, 2018. Elizabeth Davenport was started letrozole but on 04/01/2018 called the clinic and refused to take more due to bone pain and low back pain. The plan was to "wash out" the drug and then start another anastrozole.  CT C/A/P W Contrast- 06/06/18 1. Mildly enlarged lymph nodes in the LOWER mediastinum in the RIGHT paracardiac location, some of which which have slightly increased in size since the CT 04/05/2018. 2. No acute cardiopulmonary disease. 3. Moderate amount of ascites, increased since the CT 04/05/2018. 4. Mildly enlarged LEFT periaortic lymph node at the level of the renal hila, measured above. Numerous small lymph nodes elsewhere in the abdomen including the gastrohepatic ligament and porta hepatis. These nodes have increased in size since the CT 04/05/2018 and indicate metastatic disease. 5. Progression of omental, peritoneal and mesenteric metastatic disease since the CT 04/05/2018.  CA 125 06/03/2018 134.0  Elizabeth Davenport reinitiated adjuvant carboplatin-Taxol on 06/14/2018. Treatment complicated by weakness and anemia. 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 Elizabeth Davenport has been agreeable to iron. Elizabeth Davenport was on gabapentin for peripheral neuropathy.   Problem List: Patient Active Problem List   Diagnosis Date Noted  . Malignant ascites 11/05/2018  . Goals of care, counseling/discussion 11/05/2018  . Refusal of blood transfusions as patient is Jehovah's Witness 11/30/2017  . 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 History of  endometrial cancer 11/08/2016  . 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 Ovarian carcinoma, unspecified laterality ([place]) 11/25/2014  . Endometrial cancer ([place]) 11/04/2014  . S/P laparoscopic hysterectomy 11/04/2014    Past Medical History: Past Medical History:  Diagnosis Date  . Arthritis    left knee  . Asthma   . Endometrial cancer ([place]) 11/04/14  . PONV (postoperative nausea and vomiting)     Past Surgical History: Past Surgical History:  Procedure Laterality Date  . ABDOMINAL HYSTERECTOMY    . LAPAROSCOPIC BILATERAL SALPINGO OOPHERECTOMY Bilateral 11/04/2014   Procedure: LAPAROSCOPIC BILATERAL SALPINGO OOPHORECTOMY;  Surgeon: Mellody Drown, MD;  Location: [facility] ORS;  Service: Gynecology;  Laterality: Bilateral;  . LAPAROSCOPIC HYSTERECTOMY N/A 11/04/2014   Procedure: HYSTERECTOMY TOTAL LAPAROSCOPIC;  Surgeon: Mellody Drown, MD;  Location: [facility] ORS;  Service: Gynecology;  Laterality: N/A;  . removed mass from thyriod      Family History: Family History  Problem Relation Age of Onset  . Breast cancer Sister 38  . Asthma Mother   . Coronary artery disease Mother   . Diabetes Brother   . Diabetes Sister   . Diabetes Sister   . Diabetes Brother     Social History: Social History   Socioeconomic History  . Marital status: Married    Spouse name: Not on file  . Number of children: Not on file  . Years of education: Not on file  . Highest education level: Not on file  Occupational History  . Not on file  Social Needs  . Financial resource strain: Not on file  . Food insecurity:    Worry: Not on file    Inability: Not on file  . Transportation needs:    Medical: Not on file    Non-medical: Not on file  Tobacco Use  . Smoking status: Former Smoker    Packs/day: 0.25    Years: 8.00    Pack years: 2.00    Types: Cigarettes    Last attempt to quit: 06/29/1990    Years since quitting: 28.3  . Smokeless tobacco: Never Used  .  Tobacco comment: Smoking History Cigarettes 1 cig per day; quit 22 years ago; smoked for 8 years  Substance and Sexual Activity  . Alcohol use: No    Alcohol/week: 0.0 standard drinks    Comment: occasional   . Drug use: No  . Sexual activity: Yes    Partners: Male  Lifestyle  . Physical activity:    Days per week: Not on file    Minutes per session: Not on file  . Stress: Not on file  Relationships  . Social connections:    Talks on phone: Not on file    Gets together: Not on file    Attends religious service: Not on file    Active member of club or organization: Not on file    Attends meetings of clubs or organizations: Not on file    Relationship status: Not on file  . Intimate partner violence:    Fear of current or ex partner: Not on file    Emotionally abused: Not on file    Physically abused: Not on file    Forced sexual activity: Not on file  Other Topics Concern  . Not on file  Social History Narrative  . Not on file    Allergies: No Known Allergies  Current Medications: Current Outpatient Medications  Medication Sig Dispense Refill  . acetaminophen (TYLENOL) 500 MG tablet Take 500 mg  by mouth every 6 (six) hours as needed.    Marland Kitchen albuterol (PROVENTIL HFA;VENTOLIN HFA) 108 (90 BASE) MCG/ACT inhaler Inhale 2 puffs into the lungs every 6 (six) hours as needed for wheezing or shortness of breath.    . ALPRAZolam (XANAX) 0.25 MG tablet Take 1 tablet (0.25 mg total) by mouth 2 (two) times daily as needed for anxiety. 60 tablet 1  . CONSTULOSE 10 GM/15ML solution TAKE 30ML BY MOUTH TWICE DAILY FOR 5 DAYS    . exemestane (AROMASIN) 25 MG tablet Take 1 tablet (25 mg total) by mouth daily after breakfast. (Patient not taking: Reported on 08/14/2018) 30 tablet 3  . fluticasone (FLOVENT HFA) 220 MCG/ACT inhaler Take by mouth.    . gabapentin (NEURONTIN) 300 MG capsule Take 1 capsule (300 mg total) by mouth 2 (two) times daily. Neuropathic pain (Patient not taking: Reported on  09/25/2018) 30 capsule 0  . metoCLOPramide (REGLAN) 10 MG tablet Take 0.5 tablets (5 mg total) by mouth 4 (four) times daily -  before meals and at bedtime. 60 tablet 1  . ondansetron (ZOFRAN) 8 MG tablet Take 1 tablet (8 mg total) by mouth 2 (two) times daily as needed for refractory nausea / vomiting. 30 tablet 2  . oxyCODONE (OXY IR/ROXICODONE) 5 MG immediate release tablet Take 0.5-1 tablets (2.5-5 mg total) by mouth every 4 (four) hours as needed for severe pain. 30 tablet 0  . pantoprazole (PROTONIX) 40 MG tablet Take 1 tablet (40 mg total) by mouth daily. For acid reflux 90 tablet 3  . polyethylene glycol (MIRALAX) packet Take 17 g by mouth daily. 14 each 0  . prochlorperazine (COMPAZINE) 10 MG tablet Take 1 tablet (10 mg total) by mouth every 6 (six) hours as needed (Nausea or vomiting). 60 tablet 2  . senna-docusate (SENNA S) 8.6-50 MG tablet Take 1 tablet by mouth daily. 30 tablet 0  . traMADol (ULTRAM) 50 MG tablet Take 1 tablet (50 mg total) by mouth every 6 (six) hours as needed for moderate pain or severe pain. 60 tablet 0   No current facility-administered medications for this visit.     Review of Systems General:  fatigue Skin: no complaints Eyes: no complaints HEENT: no complaints Breasts: no complaints Pulmonary: no complaints Cardiac: no complaints Gastrointestinal: decreased appetite, bloating, nausea, abdominal pain/bloating/distension, constipation Genitourinary/Sexual: no complaints Ob/Gyn: no complaints Musculoskeletal: upper back pain  Hematology: no complaints Neurologic/Psych: peripheral neuropathy    Objective:  Physical Examination:  Today's Vitals   11/06/18 1028 11/06/18 1036  BP:  115/77  Pulse:  (!) 112  Resp:  20  Temp:  97.9 F (36.6 C)  TempSrc:  Tympanic  Weight: 152 lb (68.9 kg)   Height: 5\' 1"  (1.549 m)   PainSc:  8    Body mass index is 28.72 kg/m.  BP 115/77 (BP Location: Right Arm)   Pulse (!) 112   Temp 97.9 F (36.6 C)  (Tympanic)   Resp 20   Ht 5\' 1"  (1.549 m)   Wt 152 lb (68.9 kg)   LMP 11/04/2011   BMI 28.72 kg/m   ECOG Performance Status: 2 to 3  GENERAL: Chronically ill/fatigued appearing female. Appears stated age. NAD.  HEENT:  Sclera clear. Anicteric NODES:  Negative axillary, supraclavicular, inguinal lymph node survery LUNGS:  Clear to auscultation bilaterally.   HEART:  Regular rate and rhythm.  ABDOMEN:  Rounded. Tender diffusely. No RGR. No tense ascites s/p paracentesis.  No hernias, incisions well healed. EXTREMITIES:  No  peripheral edema. Atraumatic. No cyanosis SKIN:  Clear with no obvious rashes or skin changes.  NEURO:  Nonfocal. Well oriented.  Appropriate affect.  Pelvic: Exam chaperoned by NP. Vulva: normal appearing without masses, tenderness or lesions. Vagina: 2.5-3cm nodule left vaginal fornix and 2 cm nodule, mobile on right. Adnexa: surgically absent; Uterus: surgically absent, vaginal cuff well healed; Cervix: absent; Rectal: not indicated   Assessment:  Elizabeth Davenport is a 63 y.o. 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 Elizabeth Davenport was managed expectantly.  Diagnosed with low-grade serous cancer s/p laparoscopic omentectomy 12/13/2017 with optimal debulking surgery followed by 3 cycles of carbo-taxol and letrozole (switched to anastrozole d/t side effects). Imaging on 06/06/18 consistent with progression. CA 125 was elevated also. Elizabeth Davenport re-initiated carbo-taxol on 06/14/18 since and has evidence of clinical progression and increasing CA125.   Medical cormorbities: Obesity.   Jehovah's witness.  Plan:   Problem List Items Addressed This Visit      Endocrine   Ovarian carcinoma, unspecified laterality ([place]) - Primary     We discussed the current findings and recommendations with the patient and family today using  an interpreter.    Elizabeth Davenport is interested in further treatment but Elizabeth Davenport has a borderline performance status. Elizabeth Davenport is able to dress/bathe her self; her husband makes her meals as Elizabeth Davenport is too weak; Elizabeth Davenport spends over 50% waking hours in bed but that is due to pain and Elizabeth Davenport does not take her pain medication appropriately. I suspect if Elizabeth Davenport was adherent to her pain medication her performance status would be improved.   Trametinib is a therapeutic option that has been shown to have efficacy in low grade serous cancers. The toxicity profile can be challenging and toxicities includes diarrhea, and dermatologic toxicity most commonly. We provided information about the drug and Elizabeth Davenport will follow up with Elizabeth. Grayland Ormond and his team to discuss trametinib next week.   Elizabeth Davenport will return to our clinic in 3-4 months.   With regard to goals of care Elizabeth Davenport was previously DNAR. We reviewed that if Elizabeth Davenport has progressive disease despite the treatment change to trametinib or if the side effects are not tolerable to transition to Hospice. Recommendation to review further goals of care with Elizabeth. Grayland Ormond. Continue Palliative care for symptom support.   A total of 25 minutes were spent with the patient/family today; >50% was spent in education, counseling and coordination of care for low grade serous ovarian cancer. The visit was performed with Lockie Mola, the interpreter.   Beckey Rutter, DNP, AGNP-C [place] [facility] [phone] (work cell) [phone] (office)  I personally interviewed and examined the patient including performance status assessment, general, HEENT, abdomen, and pelvic exams. I developed the plan of care and provided counseling. Patient questions were answered with the assistance of the interpreter.   A total of at least 40 minutes were spent with the patient/family today; >50% was spent in education, counseling and coordination of care for ovarian cancer.  Lilyonna Steidle Gaetana Michaelis, MD     CC:  Elizabeth.  Rubie Maid

## 2018-11-06 NOTE — Patient Instructions (Signed)
Trametinib oral tablets Qu es este medicamento? El TRAMETINIB es un medicamento que acta sobre protenas que se encuentran en las clulas cancerosas y detiene su crecimiento. Se utiliza en el tratamiento del melanoma, el cncer de pulmn de clulas no pequeas y Science writer de tiroides. Este medicamento puede ser utilizado para otros usos; si tiene alguna pregunta consulte con su proveedor de atencin mdica o con su Development worker, international aid. MARCAS COMUNES: Mekinist Qu le debo informar a mi profesional de la salud antes de tomar este medicamento? Necesitan saber si usted presenta alguno de los siguientes problemas o situaciones: enfermedad ocular, problemas de la visin enfermedad cardiaca presin sangunea alta antecedentes de cogulos sanguneos enfermedad intestinal inflamatoria enfermedad renal enfermedad heptica enfermedad pulmonar o respiratoria, como asma una reaccin alrgica o inusual al trametinib, a otros medicamentos, alimentos, colorantes o conservantes si est embarazada o buscando quedar embarazada si est amamantando a un beb Cmo debo utilizar este medicamento? Tome este medicamento por va oral con un vaso de agua. Siga las instrucciones de la etiqueta del [place]. Tome este medicamento con el estmago vaco, al menos 1 hora antes o 2 horas despus de comer. No lo tome con alimentos. Tome su medicamento a intervalos regulares. No lo tome con una frecuencia mayor a la indicada. No deje de tomarlo, excepto si as lo indica su mdico. Su farmacutico le dar una Gua del medicamento especial (MedGuide, nombre en ingls) con cada receta y en cada ocasin que la vuelva a surtir. Asegrese de leer esta informacin cada vez cuidadosamente. Hable con su pediatra para informarse acerca del uso de este medicamento en nios. Puede requerir atencin especial. Sobredosis: Pngase en contacto inmediatamente con un centro toxicolgico o una sala de urgencia si usted cree que haya tomado demasiado  medicamento. ATENCIN: [employer] es solo para usted. No comparta este medicamento con nadie. Qu sucede si me olvido de una dosis? Si se olvida una dosis, tmela lo antes posible. Si falta menos de 12 horas para su prxima dosis habitual, no tomar la dosis perdida. Tome la dosis siguiente a su hora habitual. No tome dosis adicionales o dobles. Qu puede interactuar con este medicamento? Interacciones con otros medicamentos no han sido identificadas. Puede ser que esta lista no menciona todas las posibles interacciones. Informe a su profesional de [employer]	Los  de [employer] productos a base de hierbas, medicamentos de [place] o suplementos nutritivos que est tomando. Si usted fuma, consume bebidas alcohlicas o si utiliza drogas ilegales, indqueselo tambin a su profesional de [employer]	Los . Algunas sustancias pueden interactuar con su medicamento. A qu debo estar atento al usar [employer]? Visite a su mdico o a su profesional de la salud para revisar regularmente su evolucin. Si observa algn cambio en la vista, infrmelo de inmediato a su mdico o su profesional de [employer]	Los . No debe quedar embarazada mientras est usando este medicamento o por 4 meses despus de dejar de usarlo. Las mujeres deben informar a su mdico si estn buscando quedar embarazadas o si creen que podran estar embarazadas. Existe la posibilidad de efectos secundarios graves en un beb sin nacer. Para obtener ms informacin, hable con su profesional de la salud o su farmacutico. No debe amamantar a un beb mientras est tomando este medicamento o por 4 meses despus de dejar de usarlo. Este medicamento puede interferir con la capacidad de tener hijos. Usted debe hablar con su mdico o su profesional de la salud si est preocupado por su fertilidad. Qu efectos secundarios puedo tener al  utilizar este medicamento? Efectos secundarios que debe informar a su mdico o a Barrister's clerk de la salud tan pronto como sea  posible: Chief of Staff, como erupcin cutnea, comezn/picazn o urticarias, e hinchazn de la cara, los labios o la lengua visin borrosa o cambios en la visin tos diarrea sensacin de desmayos o aturdimiento nuseas enrojecimiento, formacin de ampollas, descamacin o distensin de la piel signos y sntomas de sangrado, tales como heces con sangre o de color negro y aspecto alquitranado; orina de color rojo o marrn oscuro; escupir sangre o material marrn que tiene el aspecto de granos de caf molido; Tree surgeon rojas en la piel; sangrado o moretones inusuales en los ojos, las encas o la nariz signos y sntomas de un cogulo sanguneo, tales como problemas respiratorios; cambios en la visin; dolor en el pecho; dolor de cabeza grave, repentino; dolor, hinchazn, calor en la pierna; dificultad para hablar; entumecimiento o debilidad repentina de la cara, el brazo o la pierna signos y sntomas de niveles elevados de azcar en la sangre, tales como [place]; boca seca; piel seca; aliento frutal; nuseas; dolor de estmago; aumento del apetito o la sed; aumento de la frecuencia urinaria dolor estomacal hinchazn de tobillos, pies, manos cansancio o debilidad inusual Efectos secundarios que generalmente no requieren atencin mdica (infrmelos a su mdico o a Barrister's clerk de la salud si persisten o si son molestos): llagas en la boca vmito Puede ser que esta lista no menciona todos los posibles efectos secundarios. Comunquese a su mdico por asesoramiento mdico [employer] Usted puede informar los efectos secundarios a la FDA por telfono al 1-800-FDA-1088. Dnde debo guardar mi medicina? Gurdela a temperatura entre 2 y 60 grados C (59 y 9 grados F). No la congele. Mantenga este medicamento en su envase original. Deseche todo el medicamento que no haya utilizado, despus de la fecha de vencimiento. Mantngala fuera del alcance de los nios. ATENCIN: Este folleto es un resumen.  Puede ser que no cubra toda la posible informacin. Si usted tiene preguntas acerca de esta medicina, consulte con su mdico, su farmacutico o su profesional de Technical sales engineer.  2019 Elsevier/Gold Standard (2017-01-18 00:00:00)

## 2018-11-07 NOTE — Telephone Encounter (Signed)
Oral Chemotherapy Pharmacist Encounter   Attempted to call Ms. Macrae again today with an interpretor to discuss signing up for manufacturer assistance. Called [phone] patient's daughter answered and instructed [place] to call [phone]. No answer at [phone], the interpretor left a message that I would try to call back tomorrow.  If I am unable to contact the patient via telephone tomorrow, we may have to wait until next office visit on 11/12/18 to obtain the needed manufacturer assistance signatures/consent.  Darl Pikes, PharmD, BCPS, [facility] Hematology/Oncology Clinical Pharmacist [facility] [phone]  11/07/2018 1:52 PM

## 2018-11-08 NOTE — Telephone Encounter (Signed)
Oral Chemotherapy Pharmacist Encounter   Attempted to call Ms. Hoerner with an interpretor to discuss signing up for manufacturer assistance. No answered, the interpretor left a message that I would try to catch her at her next OV with Dr. Grayland Ormond on 5/19 to sign assistance paperwork.  Darl Pikes, PharmD, BCPS, [facility] Hematology/Oncology Clinical Pharmacist [facility] [phone]  11/08/2018 4:07 PM

## 2018-11-09 NOTE — Progress Notes (Signed)
[place]  [phone] [phone]  ID: Elizabeth Davenport OB: [birth year]  MR#: [id number]  YCX#:[id number]  Patient Care Team: Letta Median, MD as PCP - General (Family Medicine) Clent Jacks, RN as Registered Nurse Rico Junker, RN as Registered Nurse Theodore Demark, RN as Registered Nurse  CHIEF COMPLAINT: Progressive low-grade ovarian malignancy  INTERVAL HISTORY: Patient returns to clinic today for further evaluation and initiation of treatment with trametinib.  She continues to have persistent abdominal pain, but this is well controlled with her current narcotic regimen.  She continues to have chronic weakness and fatigue. She has no neurologic complaints.  She denies any recent fevers or illnesses.  She has a fair appetite and denies weight loss.  She denies any chest pain, shortness of breath, cough, or hemoptysis.  She denies any nausea, vomiting, constipation, or diarrhea.  She has no urinary complaints.  Patient offers no further specific complaints today.  REVIEW OF SYSTEMS:   Review of Systems  Constitutional: Positive for malaise/fatigue. Negative for fever and weight loss.  Respiratory: Negative.  Negative for cough and shortness of breath.   Cardiovascular: Negative.  Negative for chest pain and leg swelling.  Gastrointestinal: Positive for abdominal pain. Negative for constipation, diarrhea, heartburn, nausea and vomiting.  Genitourinary: Negative.  Negative for dysuria.  Musculoskeletal: Negative.  Negative for back pain.  Skin: Negative.  Negative for rash.  Neurological: Positive for weakness. Negative for dizziness, sensory change, focal weakness and headaches.  Psychiatric/Behavioral: Negative.  The patient is not nervous/anxious.     As per HPI. Otherwise, a complete review of systems is negative.  PAST MEDICAL HISTORY: Past Medical History:  Diagnosis Date  . Arthritis    left knee  . Asthma   .  Endometrial cancer ([place]) 11/04/14  . PONV (postoperative nausea and vomiting)     PAST SURGICAL HISTORY: Past Surgical History:  Procedure Laterality Date  . ABDOMINAL HYSTERECTOMY    . LAPAROSCOPIC BILATERAL SALPINGO OOPHERECTOMY Bilateral 11/04/2014   Procedure: LAPAROSCOPIC BILATERAL SALPINGO OOPHORECTOMY;  Surgeon: Mellody Drown, MD;  Location: [facility] ORS;  Service: Gynecology;  Laterality: Bilateral;  . LAPAROSCOPIC HYSTERECTOMY N/A 11/04/2014   Procedure: HYSTERECTOMY TOTAL LAPAROSCOPIC;  Surgeon: Mellody Drown, MD;  Location: [facility] ORS;  Service: Gynecology;  Laterality: N/A;  . removed mass from thyriod      FAMILY HISTORY: Family History  Problem Relation Age of Onset  . Breast cancer Sister 18  . Asthma Mother   . Coronary artery disease Mother   . Diabetes Brother   . Diabetes Sister   . Diabetes Sister   . Diabetes Brother     ADVANCED DIRECTIVES (Y/N):  N  HEALTH MAINTENANCE: Social History   Tobacco Use  . Smoking status: Former Smoker    Packs/day: 0.25    Years: 8.00    Pack years: 2.00    Types: Cigarettes    Last attempt to quit: 06/29/1990    Years since quitting: 28.4  . Smokeless tobacco: Never Used  . Tobacco comment: Smoking History Cigarettes 1 cig per day; quit 22 years ago; smoked for 8 years  Substance Use Topics  . Alcohol use: No    Alcohol/week: 0.0 standard drinks    Comment: occasional   . Drug use: No     Colonoscopy:  PAP:  Bone density:  Lipid panel:  No Known Allergies  Current Outpatient Medications  Medication Sig Dispense Refill  . acetaminophen (TYLENOL) 500 MG tablet Take 500 mg  by mouth every 6 (six) hours as needed.    Marland Kitchen albuterol (PROVENTIL HFA;VENTOLIN HFA) 108 (90 BASE) MCG/ACT inhaler Inhale 2 puffs into the lungs every 6 (six) hours as needed for wheezing or shortness of breath.    . ALPRAZolam (XANAX) 0.25 MG tablet Take 1 tablet (0.25 mg total) by mouth 2 (two) times daily as needed for anxiety. 60 tablet 1  .  CONSTULOSE 10 GM/15ML solution TAKE 30ML BY MOUTH TWICE DAILY FOR 5 DAYS    . fluticasone (FLOVENT HFA) 220 MCG/ACT inhaler Take by mouth.    . metoCLOPramide (REGLAN) 10 MG tablet Take 0.5 tablets (5 mg total) by mouth 4 (four) times daily -  before meals and at bedtime. 60 tablet 1  . ondansetron (ZOFRAN) 8 MG tablet Take 1 tablet (8 mg total) by mouth 2 (two) times daily as needed for refractory nausea / vomiting. 30 tablet 2  . oxyCODONE (OXY IR/ROXICODONE) 5 MG immediate release tablet Take 0.5-1 tablets (2.5-5 mg total) by mouth every 4 (four) hours as needed for severe pain. 30 tablet 0  . pantoprazole (PROTONIX) 40 MG tablet Take 1 tablet (40 mg total) by mouth daily. For acid reflux 90 tablet 3  . polyethylene glycol (MIRALAX) packet Take 17 g by mouth daily. 14 each 0  . prochlorperazine (COMPAZINE) 10 MG tablet Take 1 tablet (10 mg total) by mouth every 6 (six) hours as needed (Nausea or vomiting). 60 tablet 2  . senna-docusate (SENNA S) 8.6-50 MG tablet Take 1 tablet by mouth daily. 30 tablet 0  . trametinib dimethyl sulfoxide (MEKINIST) 2 MG tablet Take 2 mg by mouth daily. Take 1 hour before or 2 hours after a meal. Store refrigerated in original container.     No current facility-administered medications for this visit.     OBJECTIVE: Vitals:   11/12/18 1436  BP: 109/78  Pulse: (!) 116  Resp: 18  SpO2: 96%     Body mass index is 29.48 kg/m.    ECOG FS:0 - Asymptomatic  General: Well-developed, well-nourished, no acute distress. Eyes: Pink conjunctiva, anicteric sclera. HEENT: Normocephalic, moist mucous membranes. Lungs: Clear to auscultation bilaterally. Heart: Regular rate and rhythm. No rubs, murmurs, or gallops. Abdomen: Mildly distended. Musculoskeletal: No edema, cyanosis, or clubbing. Neuro: Alert, answering all questions appropriately. Cranial nerves grossly intact. Skin: No rashes or petechiae noted. Psych: Normal affect.  LAB RESULTS:  Lab Results   Component Value Date   NA 132 (L) 11/04/2018   K 3.6 11/04/2018   CL 95 (L) 11/04/2018   CO2 26 11/04/2018   GLUCOSE 108 (H) 11/04/2018   BUN 6 (L) 11/04/2018   CREATININE 0.36 (L) 11/04/2018   CALCIUM 7.8 (L) 11/04/2018   PROT 7.6 11/04/2018   ALBUMIN 2.1 (L) 11/04/2018   AST 20 11/04/2018   ALT 11 11/04/2018   ALKPHOS 73 11/04/2018   BILITOT 0.5 11/04/2018   GFRNONAA >60 11/04/2018   GFRAA >60 11/04/2018    Lab Results  Component Value Date   WBC 5.1 11/04/2018   NEUTROABS 3.6 11/04/2018   HGB 8.4 (L) 11/04/2018   HCT 28.2 (L) 11/04/2018   MCV 87.3 11/04/2018   PLT 481 (H) 11/04/2018     STUDIES: US Paracentesis  Result Date: 11/05/2018 INDICATION: Lower abdominal pain, ovarian carcinoma with malignant ascites EXAM: ULTRASOUND GUIDED RIGHT PARACENTESIS MEDICATIONS: 1% LIDOCAINE LOCAL COMPLICATIONS: None immediate. PROCEDURE: Informed written consent was obtained from the patient after a discussion of the risks, benefits and alternatives to treatment.  A timeout was performed prior to the initiation of the procedure. Initial ultrasound scanning demonstrates a large amount of ascites within the right lower abdominal quadrant. The right lower abdomen was prepped and draped in the usual sterile fashion. 1% lidocaine with epinephrine was used for local anesthesia. Following this, a 6 Fr Safe-T-Centesis catheter was introduced. An ultrasound image was saved for documentation purposes. The paracentesis was performed. The catheter was removed and a dressing was applied. The patient tolerated the procedure well without immediate post procedural complication. FINDINGS: A total of approximately 300 CC of cloudy exudative peritoneal fluid was removed. Sample was not sent for laboratory analysis IMPRESSION: Successful ultrasound-guided paracentesis yielding 300 cc of peritoneal fluid. Electronically Signed   By: Jerilynn Mages.  Shick M.D.   On: 11/05/2018 12:12    ASSESSMENT: Progressive low-grade  ovarian malignancy. PLAN:   1.  Progressive low-grade ovarian malignancy: CT scan results from September 01, 2018 reviewed independently with mild prominence of mesenteric and retroperitoneal lymph nodes unchanged as well as increased prominence of pelvic lymph nodes possibly representing progression of disease.  Her CA-125 is increased to 138.0.  Recent paracentesis on Nov 05, 2018 only removed 300 mL fluid.  Patient expressed understanding that her treatment options are limited, but wished to proceed with trimetinib 2 mg daily monotherapy.  Recent clinical research revealed a median progression free survival of 13.0 months compared to 7.2 months for "physician's choice".    Return to clinic in 3 weeks with repeat laboratory work, further evaluation, and to assess her toleration of treatment. 2.  History of endometrial cancer: Patient had total hysterectomy.  Imaging as above. 3.  Anemia: Patient's hemoglobin is decreased, but stable at 8.4.  Monitor. 4.  Leukopenia: Resolved.   5.  Anxiety: Continue Xanax as needed.  6.  Indigestion: Continue Protonix as prescribed. 7.  Elevated ferritin: Likely secondary to acute phase reactant secondary to malignancy.  Monitor. 8.  Disposition: Hospice and end-of-life care were briefly discussed again today.  Appreciate palliative care input.  Proceed with treatment as above.  The entire visit was done in the presence of an interpreter.  Patient expressed understanding and was in agreement with this plan. She also understands that She can call clinic at any time with any questions, concerns, or complaints.   Cancer Staging Borderline epithelial neoplasm of ovary Staging form: Ovary, Fallopian Tube, and Primary Peritoneal Carcinoma, AJCC 8th Edition - Clinical stage from 01/11/2018: FIGO Stage III (cT3, cN0, cM0) - Signed by Lloyd Huger, MD on 01/11/2018  Endometrial cancer [facility]) Staging form: Corpus Uteri - Carcinoma, AJCC 7th Edition - Clinical: Stage I  (T1, N0, M0) - Unsigned  Ovarian carcinoma, unspecified laterality ([place]) Staging form: Ovary, AJCC 7th Edition - Clinical: Stage Unknown (T1c, NX, M0) - Signed by Forest Gleason, MD on 11/27/2014   Lloyd Huger, MD   11/15/2018 9:33 AM

## 2018-11-11 ENCOUNTER — Telehealth: Payer: Self-pay | Admitting: Student

## 2018-11-11 ENCOUNTER — Other Ambulatory Visit: Payer: Self-pay

## 2018-11-11 ENCOUNTER — Other Ambulatory Visit: Payer: Self-pay | Admitting: Student

## 2018-11-11 NOTE — Telephone Encounter (Signed)
Palliative NP along with interpreter called x 2, for scheduled telephone visit. Left message.

## 2018-11-12 ENCOUNTER — Inpatient Hospital Stay (HOSPITAL_BASED_OUTPATIENT_CLINIC_OR_DEPARTMENT_OTHER): Payer: Self-pay | Admitting: Oncology

## 2018-11-12 ENCOUNTER — Other Ambulatory Visit: Payer: Self-pay

## 2018-11-12 ENCOUNTER — Encounter: Payer: Self-pay | Admitting: Pharmacist

## 2018-11-12 VITALS — BP 109/78 | HR 116 | Resp 18 | Wt 156.0 lb

## 2018-11-12 DIAGNOSIS — C561 Malignant neoplasm of right ovary: Secondary | ICD-10-CM

## 2018-11-12 DIAGNOSIS — Z90710 Acquired absence of both cervix and uterus: Secondary | ICD-10-CM

## 2018-11-12 DIAGNOSIS — R1030 Lower abdominal pain, unspecified: Secondary | ICD-10-CM

## 2018-11-12 DIAGNOSIS — Z79811 Long term (current) use of aromatase inhibitors: Secondary | ICD-10-CM

## 2018-11-12 DIAGNOSIS — C562 Malignant neoplasm of left ovary: Secondary | ICD-10-CM

## 2018-11-12 DIAGNOSIS — M129 Arthropathy, unspecified: Secondary | ICD-10-CM

## 2018-11-12 DIAGNOSIS — R531 Weakness: Secondary | ICD-10-CM

## 2018-11-12 DIAGNOSIS — C569 Malignant neoplasm of unspecified ovary: Secondary | ICD-10-CM

## 2018-11-12 DIAGNOSIS — Z87891 Personal history of nicotine dependence: Secondary | ICD-10-CM

## 2018-11-12 DIAGNOSIS — D649 Anemia, unspecified: Secondary | ICD-10-CM

## 2018-11-12 DIAGNOSIS — F419 Anxiety disorder, unspecified: Secondary | ICD-10-CM

## 2018-11-12 DIAGNOSIS — Z79899 Other long term (current) drug therapy: Secondary | ICD-10-CM

## 2018-11-12 DIAGNOSIS — Z90722 Acquired absence of ovaries, bilateral: Secondary | ICD-10-CM

## 2018-11-12 DIAGNOSIS — Z8542 Personal history of malignant neoplasm of other parts of uterus: Secondary | ICD-10-CM

## 2018-11-12 DIAGNOSIS — J45909 Unspecified asthma, uncomplicated: Secondary | ICD-10-CM

## 2018-11-12 DIAGNOSIS — R599 Enlarged lymph nodes, unspecified: Secondary | ICD-10-CM

## 2018-11-12 DIAGNOSIS — Z9221 Personal history of antineoplastic chemotherapy: Secondary | ICD-10-CM

## 2018-11-12 DIAGNOSIS — R140 Abdominal distension (gaseous): Secondary | ICD-10-CM

## 2018-11-12 DIAGNOSIS — C786 Secondary malignant neoplasm of retroperitoneum and peritoneum: Secondary | ICD-10-CM

## 2018-11-12 DIAGNOSIS — R180 Malignant ascites: Secondary | ICD-10-CM

## 2018-11-12 DIAGNOSIS — K219 Gastro-esophageal reflux disease without esophagitis: Secondary | ICD-10-CM

## 2018-11-12 DIAGNOSIS — R7989 Other specified abnormal findings of blood chemistry: Secondary | ICD-10-CM

## 2018-11-12 DIAGNOSIS — R5383 Other fatigue: Secondary | ICD-10-CM

## 2018-11-12 DIAGNOSIS — Z803 Family history of malignant neoplasm of breast: Secondary | ICD-10-CM

## 2018-11-12 NOTE — Progress Notes (Signed)
Patient here today for follow up regarding ovarian cancer. Patient reports continued pain, pain has been improved with oxycodone.

## 2018-11-13 NOTE — Telephone Encounter (Signed)
Oral Chemotherapy Pharmacist Encounter  Met patient in clinic ahead of her OV to complete application for [facility] in an effort to reduce patient's out of pocket expense for Mekinist to $0.    Application completed and faxed to [facility] at [phone].   [employer] patient assistance phone number for follow up is [phone].   This encounter will be updated until final determination.   Darl Pikes, PharmD, BCPS, [facility] Hematology/Oncology Clinical Pharmacist [facility] [phone]  11/13/2018 12:42 PM

## 2018-11-13 NOTE — Telephone Encounter (Signed)
Oral Chemotherapy Pharmacist Encounter  Patient Education I spoke with patient following her OV for overview of new oral chemotherapy medication:Mekinist (trametinib) for the treatment of Recurrent low grade serous ovarian cancer, planned duration until disease progression or unacceptable drug toxicity.   Counseled patient on administration, dosing, side effects, monitoring, drug-food interactions, safe handling, storage, and disposal. Patient will take 2 mg by mouth daily. Take 1 hour before or 2 hours after a meal. Store refrigerated in original container.  Side effects include but not limited to: rash, diarrhea, fluid retention.    Reviewed with patient importance of keeping a medication schedule and plan for any missed doses.  Elizabeth Davenport voiced understanding and appreciation. All questions answered. Medication handout provided.  Provided patient with [facility] [place] [facility] phone number. Patient knows to call the office with questions or concerns. Oral Chemotherapy [facility] will continue to follow.  Darl Pikes, PharmD, BCPS, [facility] Hematology/Oncology Clinical Pharmacist [facility] [place] [facility] [facility] [phone]  11/13/2018 12:40 PM

## 2018-11-13 NOTE — Telephone Encounter (Signed)
Erroneous Encounter

## 2018-11-14 ENCOUNTER — Telehealth: Payer: Self-pay | Admitting: Student

## 2018-11-14 NOTE — Telephone Encounter (Signed)
Patient was not available when NP called for the previously scheduled Consult on 11/11/18.  I called patient today using the Language Line and reschedled the Palliative consult for 11/19/27 at 10:30 AM.

## 2018-11-19 ENCOUNTER — Telehealth: Payer: Self-pay

## 2018-11-19 ENCOUNTER — Other Ambulatory Visit: Payer: Self-pay | Admitting: Student

## 2018-11-19 ENCOUNTER — Other Ambulatory Visit: Payer: Self-pay

## 2018-11-19 DIAGNOSIS — Z515 Encounter for palliative care: Secondary | ICD-10-CM

## 2018-11-19 NOTE — Telephone Encounter (Signed)
At the direction of Latoya NP, message sent to Dr. Grayland Ormond with updates/recommendations.

## 2018-11-19 NOTE — Telephone Encounter (Signed)
Oral Oncology Patient Advocate Encounter  Received notification from [place] that patient has been successfully enrolled into their program to receive Mekinist from the manufacturer at $0 out of pocket until 11/14/2019.   NPAF phone number: [phone].   Patient knows to call the office with questions or concerns.   Oral Oncology Clinic will continue to follow.  [place] Patient [place] Phone [phone] Fax [phone] 11/19/2018 8:41 AM

## 2018-11-19 NOTE — Progress Notes (Signed)
[facility] Consult Note Telephone: [phone]  Fax: [phone]  PATIENT NAME: Elizabeth Davenport DOB: [birth year] MRN: [id number]  PRIMARY CARE PROVIDER:   Letta Median, MD  REFERRING PROVIDER:  Dr. Grayland Ormond  RESPONSIBLE PARTY:  Daughter, Ann Maki   ASSESSMENT:  Due to the COVID-19 crisis, this visit was done via telemedicine and it was initiated and consent by this patient and or family. Visit was conducted with Interpreter Alphonso via interpreter service. NP explained reason for Palliative Medicine consult. We discussed her advanced disease process; we discussed continuing aggressive treatment vs. Comfort measures. Elizabeth Davenport states she had been unable to walk, very weak on previous treatment and had difficulty tolerating. She states she does not want this to happen again. She would like to try new treatment, the trametinib to see how she does. We discussed Hospice as an option; NP explained hospice services and she states she and her husband will decide after she goes to her next appointment. We discussed symptom management; pain currently being managed. Cancer center to be contacted with recommendation for simethicone prn and to increase her senna-S. Elizabeth Davenport was given the opportunity to ask questions. We also briefly discussed advanced directives; she states this paperwork had been filled out. She was unable to recall what she had requested. When asked about if she would want CPR, she states she would have to think about this.        RECOMMENDATIONS and PLAN:  1. Medical goals of therapy: Elizabeth Davenport is scheduled to start Trametinib; she is to follow up for scheduled appointment at [facility]. Palliative Medicine will make recommendations as needed.  2. Symptom management: Pain-continue oxycodone every 4 hours prn pain. Constipation-Recommendation-increase senna-S to 2 tabs BID, continue miralax 17gm daily. Gas- start  simethicone 80mg  QID prn.   Palliative Medicine to follow up in 3 weeks or sooner, if needed.   I spent 45 minutes providing this consultation,  from 10:30am to 11:15am. More than 50% of the time in this consultation was spent coordinating communication.   HISTORY OF PRESENT ILLNESS:  Elizabeth Davenport is a 63 y.o. female with multiple medical problems including recurrent low-grade ovarian cancer, endometrial cancer, malignant ascites, history of total hysterectomy, asthma, arthritis. Her CA-125 has increased to 138.0. She had paracentesis on 11/05/2018; 324ml fluid removed. She is to start Trametinib 2mg  daily; medication is scheduled to arrive tomorrow. Elizabeth Davenport currently resides at home with her husband. She does report feeling tired. She is able to perform adl's such as bathing, dressing and grooming. She states she is walking slow and has a cane and wheel chair; she uses wheel chair when she goes to medical appointments. Her pain is better managed now with taking oxycodone 5mg  one tablet twice a day. She states she sometimes breaths heavily when in pain, denies dyspnea otherwise or cough. She does report gas and constipation. She is taking senna once a day and miralax. She is eating shakes with fruit and vegetables to help supplement her diet as she states her appetite has not been feeling hungry. She is sleeping well at night. She states that she recently filled out paperwork spelling out her wishes and gave them to the doctor. She was unsure of what the paperwork stated regarding CPR. She states her daughter would be listed as her responsible party.   CODE STATUS: Full Code   HOSPICE ELIGIBILITY/DIAGNOSIS: TBD  PAST MEDICAL HISTORY:  Past Medical History:  Diagnosis Date  .  Arthritis    left knee  . Asthma   . Endometrial cancer ([place]) 11/04/14  . PONV (postoperative nausea and vomiting)     SOCIAL HX:  Social History   Tobacco Use  . Smoking status: Former Smoker    Packs/day: 0.25     Years: 8.00    Pack years: 2.00    Types: Cigarettes    Last attempt to quit: 06/29/1990    Years since quitting: 28.4  . Smokeless tobacco: Never Used  . Tobacco comment: Smoking History Cigarettes 1 cig per day; quit 22 years ago; smoked for 8 years  Substance Use Topics  . Alcohol use: No    Alcohol/week: 0.0 standard drinks    Comment: occasional     ALLERGIES: No Known Allergies   PERTINENT MEDICATIONS:  Outpatient Encounter Medications as of 11/19/2018  Medication Sig  . acetaminophen (TYLENOL) 500 MG tablet Take 500 mg by mouth every 6 (six) hours as needed.  Marland Kitchen albuterol (PROVENTIL HFA;VENTOLIN HFA) 108 (90 BASE) MCG/ACT inhaler Inhale 2 puffs into the lungs every 6 (six) hours as needed for wheezing or shortness of breath.  . ALPRAZolam (XANAX) 0.25 MG tablet Take 1 tablet (0.25 mg total) by mouth 2 (two) times daily as needed for anxiety.  . CONSTULOSE 10 GM/15ML solution TAKE 30ML BY MOUTH TWICE DAILY FOR 5 DAYS  . fluticasone (FLOVENT HFA) 220 MCG/ACT inhaler Take by mouth.  . metoCLOPramide (REGLAN) 10 MG tablet Take 0.5 tablets (5 mg total) by mouth 4 (four) times daily -  before meals and at bedtime.  . ondansetron (ZOFRAN) 8 MG tablet Take 1 tablet (8 mg total) by mouth 2 (two) times daily as needed for refractory nausea / vomiting.  Marland Kitchen oxyCODONE (OXY IR/ROXICODONE) 5 MG immediate release tablet Take 0.5-1 tablets (2.5-5 mg total) by mouth every 4 (four) hours as needed for severe pain.  . pantoprazole (PROTONIX) 40 MG tablet Take 1 tablet (40 mg total) by mouth daily. For acid reflux  . polyethylene glycol (MIRALAX) packet Take 17 g by mouth daily.  . prochlorperazine (COMPAZINE) 10 MG tablet Take 1 tablet (10 mg total) by mouth every 6 (six) hours as needed (Nausea or vomiting).  Marland Kitchen senna-docusate (SENNA S) 8.6-50 MG tablet Take 1 tablet by mouth daily.  . trametinib dimethyl sulfoxide (MEKINIST) 2 MG tablet Take 2 mg by mouth daily. Take 1 hour before or 2 hours after a  meal. Store refrigerated in original container.   No facility-administered encounter medications on file as of 11/19/2018.     PHYSICAL EXAM:   Physical exam deferred.   Ezekiel Slocumb, NP

## 2018-11-21 ENCOUNTER — Telehealth: Payer: Self-pay | Admitting: Pharmacy Technician

## 2018-11-21 NOTE — Telephone Encounter (Signed)
Oral Oncology Patient Advocate Encounter  Called [employer] to check the delivery status of [facility].  Medication was mailed on 5/26 and was received on 5/27.  [facility] Patient [place] Phone [phone] Fax [phone] 11/21/2018 4:09 PM

## 2018-12-01 NOTE — Progress Notes (Signed)
[facility]  [phone] [phone]  ID: Elizabeth Davenport OB: Jan 15, 1956  MR#: [id number]  NGE#:[id number]  Patient Care Team: Letta Median, MD as PCP - General (Family Medicine) Clent Jacks, RN as Registered Nurse Rico Junker, RN as Registered Nurse Theodore Demark, RN as Registered Nurse  CHIEF COMPLAINT: Progressive low-grade ovarian malignancy  INTERVAL HISTORY: Patient returns to clinic today for repeat laboratory work, further evaluation, and assess her toleration of trametinib.  Patient now has persistent nausea, but worse after eating.  She also has occasional diarrhea.  She has a poor appetite.  She also has worsening bilateral lower extremity edema.  She has persistent weakness and fatigue.  She does not complain of pain today.  She has no neurologic complaints.  She denies any recent fevers or illnesses.  She denies any chest pain, shortness of breath, cough, or hemoptysis.  She has no urinary complaints.  Patient offers no further specific complaints today.  REVIEW OF SYSTEMS:   Review of Systems  Constitutional: Positive for malaise/fatigue. Negative for fever and weight loss.  Respiratory: Negative.  Negative for cough and shortness of breath.   Cardiovascular: Positive for leg swelling. Negative for chest pain.  Gastrointestinal: Positive for diarrhea, nausea and vomiting. Negative for abdominal pain, constipation and heartburn.  Genitourinary: Negative.  Negative for dysuria.  Musculoskeletal: Negative.  Negative for back pain.  Skin: Negative.  Negative for rash.  Neurological: Positive for weakness. Negative for dizziness, sensory change, focal weakness and headaches.  Psychiatric/Behavioral: Negative.  The patient is not nervous/anxious.     As per HPI. Otherwise, a complete review of systems is negative.  PAST MEDICAL HISTORY: Past Medical History:  Diagnosis Date  . Arthritis    left knee  . Asthma   .  Endometrial cancer ([place]) 11/04/14  . PONV (postoperative nausea and vomiting)     PAST SURGICAL HISTORY: Past Surgical History:  Procedure Laterality Date  . ABDOMINAL HYSTERECTOMY    . LAPAROSCOPIC BILATERAL SALPINGO OOPHERECTOMY Bilateral 11/04/2014   Procedure: LAPAROSCOPIC BILATERAL SALPINGO OOPHORECTOMY;  Surgeon: Mellody Drown, MD;  Location: [facility] ORS;  Service: Gynecology;  Laterality: Bilateral;  . LAPAROSCOPIC HYSTERECTOMY N/A 11/04/2014   Procedure: HYSTERECTOMY TOTAL LAPAROSCOPIC;  Surgeon: Mellody Drown, MD;  Location: [facility] ORS;  Service: Gynecology;  Laterality: N/A;  . removed mass from thyriod      FAMILY HISTORY: Family History  Problem Relation Age of Onset  . Breast cancer Sister 28  . Asthma Mother   . Coronary artery disease Mother   . Diabetes Brother   . Diabetes Sister   . Diabetes Sister   . Diabetes Brother     ADVANCED DIRECTIVES (Y/N):  N  HEALTH MAINTENANCE: Social History   Tobacco Use  . Smoking status: Former Smoker    Packs/day: 0.25    Years: 8.00    Pack years: 2.00    Types: Cigarettes    Last attempt to quit: 06/29/1990    Years since quitting: 28.4  . Smokeless tobacco: Never Used  . Tobacco comment: Smoking History Cigarettes 1 cig per day; quit 22 years ago; smoked for 8 years  Substance Use Topics  . Alcohol use: No    Alcohol/week: 0.0 standard drinks    Comment: occasional   . Drug use: No     Colonoscopy:  PAP:  Bone density:  Lipid panel:  No Known Allergies  Current Outpatient Medications  Medication Sig Dispense Refill  . acetaminophen (TYLENOL) 500 MG  tablet Take 500 mg by mouth every 6 (six) hours as needed.    Marland Kitchen albuterol (PROVENTIL HFA;VENTOLIN HFA) 108 (90 BASE) MCG/ACT inhaler Inhale 2 puffs into the lungs every 6 (six) hours as needed for wheezing or shortness of breath.    . ALPRAZolam (XANAX) 0.25 MG tablet Take 1 tablet (0.25 mg total) by mouth 2 (two) times daily as needed for anxiety. 60 tablet 1  .  CONSTULOSE 10 GM/15ML solution TAKE 30ML BY MOUTH TWICE DAILY FOR 5 DAYS    . fluticasone (FLOVENT HFA) 220 MCG/ACT inhaler Take by mouth.    . metoCLOPramide (REGLAN) 10 MG tablet Take 0.5 tablets (5 mg total) by mouth 4 (four) times daily -  before meals and at bedtime. 60 tablet 1  . ondansetron (ZOFRAN) 8 MG tablet Take 1 tablet (8 mg total) by mouth 2 (two) times daily as needed for refractory nausea / vomiting. 30 tablet 2  . oxyCODONE (OXY IR/ROXICODONE) 5 MG immediate release tablet Take 0.5-1 tablets (2.5-5 mg total) by mouth every 4 (four) hours as needed for severe pain. 30 tablet 0  . pantoprazole (PROTONIX) 40 MG tablet Take 1 tablet (40 mg total) by mouth daily. For acid reflux 90 tablet 3  . senna-docusate (SENNA S) 8.6-50 MG tablet Take 1 tablet by mouth daily. 30 tablet 0  . trametinib dimethyl sulfoxide (MEKINIST) 2 MG tablet Take 2 mg by mouth daily. Take 1 hour before or 2 hours after a meal. Store refrigerated in original container.   Medication is scheduled to arrive on 11/20/18 per patient.    . polyethylene glycol (MIRALAX) packet Take 17 g by mouth daily. 14 each 0   No current facility-administered medications for this visit.     OBJECTIVE: Vitals:   12/03/18 1110  BP: 118/84  Pulse: 85  Temp: 98 F (36.7 C)     Body mass index is 29.29 kg/m.    ECOG FS:0 - Asymptomatic  General: Well-developed, well-nourished, no acute distress. Eyes: Pink conjunctiva, anicteric sclera. HEENT: Normocephalic, moist mucous membranes. Lungs: Clear to auscultation bilaterally. Heart: Regular rate and rhythm. No rubs, murmurs, or gallops. Abdomen: Soft, nontender, nondistended. No organomegaly noted, normoactive bowel sounds. Musculoskeletal: No edema, cyanosis, or clubbing. Neuro: Alert, answering all questions appropriately. Cranial nerves grossly intact. Skin: No rashes or petechiae noted. Psych: Normal affect.  LAB RESULTS:  Lab Results  Component Value Date   NA 133  (L) 12/03/2018   K 3.2 (L) 12/03/2018   CL 100 12/03/2018   CO2 25 12/03/2018   GLUCOSE 101 (H) 12/03/2018   BUN 8 12/03/2018   CREATININE 0.37 (L) 12/03/2018   CALCIUM 7.4 (L) 12/03/2018   PROT 7.2 12/03/2018   ALBUMIN 1.9 (L) 12/03/2018   AST 43 (H) 12/03/2018   ALT 21 12/03/2018   ALKPHOS 112 12/03/2018   BILITOT 0.6 12/03/2018   GFRNONAA >60 12/03/2018   GFRAA >60 12/03/2018    Lab Results  Component Value Date   WBC 4.8 12/03/2018   NEUTROABS 3.5 12/03/2018   HGB 8.8 (L) 12/03/2018   HCT 29.5 (L) 12/03/2018   MCV 87.8 12/03/2018   PLT 202 12/03/2018     STUDIES: US Paracentesis  Result Date: 11/05/2018 INDICATION: Lower abdominal pain, ovarian carcinoma with malignant ascites EXAM: ULTRASOUND GUIDED RIGHT PARACENTESIS MEDICATIONS: 1% LIDOCAINE LOCAL COMPLICATIONS: None immediate. PROCEDURE: Informed written consent was obtained from the patient after a discussion of the risks, benefits and alternatives to treatment. A timeout was performed prior to the  initiation of the procedure. Initial ultrasound scanning demonstrates a large amount of ascites within the right lower abdominal quadrant. The right lower abdomen was prepped and draped in the usual sterile fashion. 1% lidocaine with epinephrine was used for local anesthesia. Following this, a 6 Fr Safe-T-Centesis catheter was introduced. An ultrasound image was saved for documentation purposes. The paracentesis was performed. The catheter was removed and a dressing was applied. The patient tolerated the procedure well without immediate post procedural complication. FINDINGS: A total of approximately 300 CC of cloudy exudative peritoneal fluid was removed. Sample was not sent for laboratory analysis IMPRESSION: Successful ultrasound-guided paracentesis yielding 300 cc of peritoneal fluid. Electronically Signed   By: Jerilynn Mages.  Shick M.D.   On: 11/05/2018 12:12    ASSESSMENT: Progressive low-grade ovarian malignancy. PLAN:   1.   Progressive low-grade ovarian malignancy: CT scan results from September 01, 2018 reviewed independently with mild prominence of mesenteric and retroperitoneal lymph nodes unchanged as well as increased prominence of pelvic lymph nodes possibly representing progression of disease.  Her CA-125 continues to increase and is now 385.  Her most recent paracentesis on Nov 05, 2018 only removed 300 mL fluid.  Patient expressed understanding that her treatment options are limited, but wishes to continue with trimetinib 2 mg daily monotherapy.  Recent clinical research revealed a median progression free survival of 13.0 months compared to 7.2 months for "physician's choice".  Return to clinic in 2 weeks for repeat laboratory can further evaluation.   2.  History of endometrial cancer: Patient had total hysterectomy.  Imaging as above. 3.  Anemia: Hemoglobin is decreased, but stable at 8.8. 4.  Leukopenia: Resolved.   5.  Anxiety: Continue Xanax as needed. 6.  Nausea/vomiting: Patient was given a prescription for Reglan. 7.  Diarrhea: Continue to use OTC Imodium. 9.  Disposition: Previously, patient declined hospice and end-of-life care.  Appreciate palliative care input.  Proceed with treatment as above.  The entire visit was done in the presence of an interpreter.  Patient expressed understanding and was in agreement with this plan. She also understands that She can call clinic at any time with any questions, concerns, or complaints.   Cancer Staging Borderline epithelial neoplasm of ovary Staging form: Ovary, Fallopian Tube, and Primary Peritoneal Carcinoma, AJCC 8th Edition - Clinical stage from 01/11/2018: FIGO Stage III (cT3, cN0, cM0) - Signed by Lloyd Huger, MD on 01/11/2018  Endometrial cancer [facility]) Staging form: Corpus Uteri - Carcinoma, AJCC 7th Edition - Clinical: Stage I (T1, N0, M0) - Unsigned  Ovarian carcinoma, unspecified laterality ([place]) Staging form: Ovary, AJCC 7th Edition -  Clinical: Stage Unknown (T1c, NX, M0) - Signed by Forest Gleason, MD on 11/27/2014   Lloyd Huger, MD   12/04/2018 6:48 AM

## 2018-12-03 ENCOUNTER — Inpatient Hospital Stay: Payer: Self-pay | Attending: Oncology

## 2018-12-03 ENCOUNTER — Inpatient Hospital Stay (HOSPITAL_BASED_OUTPATIENT_CLINIC_OR_DEPARTMENT_OTHER): Payer: Self-pay | Admitting: Oncology

## 2018-12-03 ENCOUNTER — Encounter: Payer: Self-pay | Admitting: Oncology

## 2018-12-03 ENCOUNTER — Other Ambulatory Visit: Payer: Self-pay

## 2018-12-03 VITALS — BP 118/84 | HR 85 | Temp 98.0°F | Ht 61.0 in | Wt 155.0 lb

## 2018-12-03 DIAGNOSIS — R5383 Other fatigue: Secondary | ICD-10-CM

## 2018-12-03 DIAGNOSIS — Z79899 Other long term (current) drug therapy: Secondary | ICD-10-CM | POA: Insufficient documentation

## 2018-12-03 DIAGNOSIS — Z90722 Acquired absence of ovaries, bilateral: Secondary | ICD-10-CM

## 2018-12-03 DIAGNOSIS — Z803 Family history of malignant neoplasm of breast: Secondary | ICD-10-CM | POA: Insufficient documentation

## 2018-12-03 DIAGNOSIS — R600 Localized edema: Secondary | ICD-10-CM | POA: Insufficient documentation

## 2018-12-03 DIAGNOSIS — R21 Rash and other nonspecific skin eruption: Secondary | ICD-10-CM | POA: Insufficient documentation

## 2018-12-03 DIAGNOSIS — R110 Nausea: Secondary | ICD-10-CM | POA: Insufficient documentation

## 2018-12-03 DIAGNOSIS — Z90710 Acquired absence of both cervix and uterus: Secondary | ICD-10-CM

## 2018-12-03 DIAGNOSIS — M129 Arthropathy, unspecified: Secondary | ICD-10-CM

## 2018-12-03 DIAGNOSIS — C786 Secondary malignant neoplasm of retroperitoneum and peritoneum: Secondary | ICD-10-CM | POA: Insufficient documentation

## 2018-12-03 DIAGNOSIS — C562 Malignant neoplasm of left ovary: Secondary | ICD-10-CM | POA: Insufficient documentation

## 2018-12-03 DIAGNOSIS — R112 Nausea with vomiting, unspecified: Secondary | ICD-10-CM

## 2018-12-03 DIAGNOSIS — D649 Anemia, unspecified: Secondary | ICD-10-CM | POA: Insufficient documentation

## 2018-12-03 DIAGNOSIS — R42 Dizziness and giddiness: Secondary | ICD-10-CM | POA: Insufficient documentation

## 2018-12-03 DIAGNOSIS — R531 Weakness: Secondary | ICD-10-CM | POA: Insufficient documentation

## 2018-12-03 DIAGNOSIS — C569 Malignant neoplasm of unspecified ovary: Secondary | ICD-10-CM

## 2018-12-03 DIAGNOSIS — R197 Diarrhea, unspecified: Secondary | ICD-10-CM

## 2018-12-03 DIAGNOSIS — Z8542 Personal history of malignant neoplasm of other parts of uterus: Secondary | ICD-10-CM

## 2018-12-03 DIAGNOSIS — R630 Anorexia: Secondary | ICD-10-CM | POA: Insufficient documentation

## 2018-12-03 DIAGNOSIS — Z87891 Personal history of nicotine dependence: Secondary | ICD-10-CM

## 2018-12-03 DIAGNOSIS — F419 Anxiety disorder, unspecified: Secondary | ICD-10-CM

## 2018-12-03 DIAGNOSIS — J45909 Unspecified asthma, uncomplicated: Secondary | ICD-10-CM

## 2018-12-03 DIAGNOSIS — D3910 Neoplasm of uncertain behavior of unspecified ovary: Secondary | ICD-10-CM

## 2018-12-03 DIAGNOSIS — C561 Malignant neoplasm of right ovary: Secondary | ICD-10-CM | POA: Insufficient documentation

## 2018-12-03 DIAGNOSIS — R609 Edema, unspecified: Secondary | ICD-10-CM

## 2018-12-03 DIAGNOSIS — R109 Unspecified abdominal pain: Secondary | ICD-10-CM | POA: Insufficient documentation

## 2018-12-03 DIAGNOSIS — E876 Hypokalemia: Secondary | ICD-10-CM | POA: Insufficient documentation

## 2018-12-03 LAB — COMPREHENSIVE METABOLIC PANEL
ALT: 21 U/L (ref 0–44)
AST: 43 U/L — ABNORMAL HIGH (ref 15–41)
Albumin: 1.9 g/dL — ABNORMAL LOW (ref 3.5–5.0)
Alkaline Phosphatase: 112 U/L (ref 38–126)
Anion gap: 8 (ref 5–15)
BUN: 8 mg/dL (ref 8–23)
CO2: 25 mmol/L (ref 22–32)
Calcium: 7.4 mg/dL — ABNORMAL LOW (ref 8.9–10.3)
Chloride: 100 mmol/L (ref 98–111)
Creatinine, Ser: 0.37 mg/dL — ABNORMAL LOW (ref 0.44–1.00)
GFR calc Af Amer: >60 mL/min (ref >60)
GFR calc non Af Amer: >60 mL/min (ref >60)
Glucose, Bld: 101 mg/dL — ABNORMAL HIGH (ref 70–99)
Potassium: 3.2 mmol/L — ABNORMAL LOW (ref 3.5–5.1)
Sodium: 133 mmol/L — ABNORMAL LOW (ref 135–145)
Total Bilirubin: 0.6 mg/dL (ref 0.3–1.2)
Total Protein: 7.2 g/dL (ref 6.5–8.1)

## 2018-12-03 LAB — CBC WITH DIFFERENTIAL/PLATELET
Abs Immature Granulocytes: 0.02 10*3/uL (ref 0.00–0.07)
Basophils Absolute: 0 10*3/uL (ref 0.0–0.1)
Basophils Relative: 0 %
Eosinophils Absolute: 0 10*3/uL (ref 0.0–0.5)
Eosinophils Relative: 0 %
HCT: 29.5 % — ABNORMAL LOW (ref 36.0–46.0)
Hemoglobin: 8.8 g/dL — ABNORMAL LOW (ref 12.0–15.0)
Immature Granulocytes: 0 %
Lymphocytes Relative: 21 %
Lymphs Abs: 1 10*3/uL (ref 0.7–4.0)
MCH: 26.2 pg (ref 26.0–34.0)
MCHC: 29.8 g/dL — ABNORMAL LOW (ref 30.0–36.0)
MCV: 87.8 fL (ref 80.0–100.0)
Monocytes Absolute: 0.3 10*3/uL (ref 0.1–1.0)
Monocytes Relative: 5 %
Neutro Abs: 3.5 10*3/uL (ref 1.7–7.7)
Neutrophils Relative %: 74 %
Platelets: 202 10*3/uL (ref 150–400)
RBC: 3.36 MIL/uL — ABNORMAL LOW (ref 3.87–5.11)
RDW: 19.7 % — ABNORMAL HIGH (ref 11.5–15.5)
WBC: 4.8 10*3/uL (ref 4.0–10.5)
nRBC: 0 % (ref 0.0–0.2)

## 2018-12-03 LAB — IRON AND TIBC: Iron: 18 ug/dL — ABNORMAL LOW (ref 28–170)

## 2018-12-03 LAB — FERRITIN: Ferritin: 2440 ng/mL — ABNORMAL HIGH (ref 11–307)

## 2018-12-03 MED ORDER — PROCHLORPERAZINE MALEATE 10 MG PO TABS: 10.0000 mg | ORAL_TABLET | Freq: Four times a day (QID) | ORAL | 2 refills | Status: DC | PRN

## 2018-12-03 MED ORDER — METOCLOPRAMIDE HCL 10 MG PO TABS: 5.0000 mg | ORAL_TABLET | Freq: Three times a day (TID) | ORAL | 1 refills | Status: AC

## 2018-12-03 NOTE — Progress Notes (Signed)
Patient stated that her appetite is poor for several weeks. Patient also stated that she stays nauseated even when taking her anti-nausea medication. Patient had also stated that she has had edema on her bilateral lower extremity for two weeks. Patient stated that she has had diarrhea for the last week. Patient did not take her chemo medication today because it makes her "sick" with bitter mouth and stomach ache. Patient is requesting a refill on her Compazine.

## 2018-12-04 LAB — CA 125: Cancer Antigen (CA) 125: 385 U/mL — ABNORMAL HIGH (ref 0.0–38.1)

## 2018-12-18 ENCOUNTER — Other Ambulatory Visit: Payer: Self-pay

## 2018-12-18 ENCOUNTER — Telehealth: Payer: Self-pay | Admitting: *Deleted

## 2018-12-18 ENCOUNTER — Inpatient Hospital Stay: Payer: Self-pay

## 2018-12-18 ENCOUNTER — Inpatient Hospital Stay (HOSPITAL_BASED_OUTPATIENT_CLINIC_OR_DEPARTMENT_OTHER): Payer: Self-pay | Admitting: Oncology

## 2018-12-18 ENCOUNTER — Encounter: Payer: Self-pay | Admitting: Oncology

## 2018-12-18 VITALS — BP 102/72 | HR 112 | Temp 99.2°F | Resp 18 | Wt 145.0 lb

## 2018-12-18 DIAGNOSIS — J45909 Unspecified asthma, uncomplicated: Secondary | ICD-10-CM

## 2018-12-18 DIAGNOSIS — C786 Secondary malignant neoplasm of retroperitoneum and peritoneum: Secondary | ICD-10-CM

## 2018-12-18 DIAGNOSIS — Z87891 Personal history of nicotine dependence: Secondary | ICD-10-CM

## 2018-12-18 DIAGNOSIS — D649 Anemia, unspecified: Secondary | ICD-10-CM

## 2018-12-18 DIAGNOSIS — R109 Unspecified abdominal pain: Secondary | ICD-10-CM

## 2018-12-18 DIAGNOSIS — R112 Nausea with vomiting, unspecified: Secondary | ICD-10-CM

## 2018-12-18 DIAGNOSIS — Z803 Family history of malignant neoplasm of breast: Secondary | ICD-10-CM

## 2018-12-18 DIAGNOSIS — R110 Nausea: Secondary | ICD-10-CM

## 2018-12-18 DIAGNOSIS — C569 Malignant neoplasm of unspecified ovary: Secondary | ICD-10-CM

## 2018-12-18 DIAGNOSIS — Z8542 Personal history of malignant neoplasm of other parts of uterus: Secondary | ICD-10-CM

## 2018-12-18 DIAGNOSIS — M129 Arthropathy, unspecified: Secondary | ICD-10-CM

## 2018-12-18 DIAGNOSIS — R609 Edema, unspecified: Secondary | ICD-10-CM

## 2018-12-18 DIAGNOSIS — R5383 Other fatigue: Secondary | ICD-10-CM

## 2018-12-18 DIAGNOSIS — C562 Malignant neoplasm of left ovary: Secondary | ICD-10-CM

## 2018-12-18 DIAGNOSIS — R21 Rash and other nonspecific skin eruption: Secondary | ICD-10-CM

## 2018-12-18 DIAGNOSIS — R630 Anorexia: Secondary | ICD-10-CM

## 2018-12-18 DIAGNOSIS — Z79899 Other long term (current) drug therapy: Secondary | ICD-10-CM

## 2018-12-18 DIAGNOSIS — Z90722 Acquired absence of ovaries, bilateral: Secondary | ICD-10-CM

## 2018-12-18 DIAGNOSIS — E876 Hypokalemia: Secondary | ICD-10-CM

## 2018-12-18 DIAGNOSIS — R531 Weakness: Secondary | ICD-10-CM

## 2018-12-18 DIAGNOSIS — R600 Localized edema: Secondary | ICD-10-CM

## 2018-12-18 DIAGNOSIS — R197 Diarrhea, unspecified: Secondary | ICD-10-CM

## 2018-12-18 DIAGNOSIS — Z90710 Acquired absence of both cervix and uterus: Secondary | ICD-10-CM

## 2018-12-18 DIAGNOSIS — F419 Anxiety disorder, unspecified: Secondary | ICD-10-CM

## 2018-12-18 DIAGNOSIS — R42 Dizziness and giddiness: Secondary | ICD-10-CM

## 2018-12-18 DIAGNOSIS — C561 Malignant neoplasm of right ovary: Secondary | ICD-10-CM

## 2018-12-18 LAB — CBC WITH DIFFERENTIAL/PLATELET
Abs Immature Granulocytes: 0.03 10*3/uL (ref 0.00–0.07)
Basophils Absolute: 0 10*3/uL (ref 0.0–0.1)
Basophils Relative: 0 %
Eosinophils Absolute: 0.2 10*3/uL (ref 0.0–0.5)
Eosinophils Relative: 2 %
HCT: 29.2 % — ABNORMAL LOW (ref 36.0–46.0)
Hemoglobin: 9.3 g/dL — ABNORMAL LOW (ref 12.0–15.0)
Immature Granulocytes: 0 %
Lymphocytes Relative: 9 %
Lymphs Abs: 0.8 10*3/uL (ref 0.7–4.0)
MCH: 27.6 pg (ref 26.0–34.0)
MCHC: 31.8 g/dL (ref 30.0–36.0)
MCV: 86.6 fL (ref 80.0–100.0)
Monocytes Absolute: 1 10*3/uL (ref 0.1–1.0)
Monocytes Relative: 11 %
Neutro Abs: 7.2 10*3/uL (ref 1.7–7.7)
Neutrophils Relative %: 78 %
Platelets: 211 10*3/uL (ref 150–400)
RBC: 3.37 MIL/uL — ABNORMAL LOW (ref 3.87–5.11)
RDW: 20.7 % — ABNORMAL HIGH (ref 11.5–15.5)
WBC: 9.2 10*3/uL (ref 4.0–10.5)
nRBC: 0 % (ref 0.0–0.2)

## 2018-12-18 LAB — COMPREHENSIVE METABOLIC PANEL
ALT: 28 U/L (ref 0–44)
AST: 35 U/L (ref 15–41)
Albumin: 1.9 g/dL — ABNORMAL LOW (ref 3.5–5.0)
Alkaline Phosphatase: 196 U/L — ABNORMAL HIGH (ref 38–126)
Anion gap: 9 (ref 5–15)
BUN: 9 mg/dL (ref 8–23)
CO2: 22 mmol/L (ref 22–32)
Calcium: 7.3 mg/dL — ABNORMAL LOW (ref 8.9–10.3)
Chloride: 99 mmol/L (ref 98–111)
Creatinine, Ser: 0.43 mg/dL — ABNORMAL LOW (ref 0.44–1.00)
GFR calc Af Amer: >60 mL/min (ref >60)
GFR calc non Af Amer: >60 mL/min (ref >60)
Glucose, Bld: 96 mg/dL (ref 70–99)
Potassium: 3 mmol/L — ABNORMAL LOW (ref 3.5–5.1)
Sodium: 130 mmol/L — ABNORMAL LOW (ref 135–145)
Total Bilirubin: 1.2 mg/dL (ref 0.3–1.2)
Total Protein: 7.3 g/dL (ref 6.5–8.1)

## 2018-12-18 LAB — URINALYSIS, COMPLETE (UACMP) WITH MICROSCOPIC
Bacteria, UA: NONE SEEN
Glucose, UA: NEGATIVE mg/dL
Hgb urine dipstick: NEGATIVE
Ketones, ur: 20 mg/dL — AB
Leukocytes,Ua: NEGATIVE
Nitrite: NEGATIVE
Protein, ur: 30 mg/dL — AB
Specific Gravity, Urine: 1.028 (ref 1.005–1.030)
pH: 5 (ref 5.0–8.0)

## 2018-12-18 MED ORDER — SODIUM CHLORIDE 0.9 % IV SOLN
Freq: Once | INTRAVENOUS | Status: AC
  Administered 2018-12-18: 11:00:00 via INTRAVENOUS
  Filled 2018-12-18: qty 250

## 2018-12-18 MED ORDER — SODIUM CHLORIDE 0.9 % IV SOLN
Freq: Once | INTRAVENOUS | Status: AC
  Administered 2018-12-18: 12:00:00 via INTRAVENOUS
  Filled 2018-12-18: qty 250

## 2018-12-18 MED ORDER — DEXAMETHASONE SODIUM PHOSPHATE 10 MG/ML IJ SOLN
10.0000 mg | Freq: Once | INTRAMUSCULAR | Status: AC
  Administered 2018-12-18: 10 mg via INTRAVENOUS
  Filled 2018-12-18: qty 1

## 2018-12-18 MED ORDER — POTASSIUM CHLORIDE ER 10 MEQ PO TBCR: 10.0000 meq | EXTENDED_RELEASE_TABLET | Freq: Every day | ORAL | 0 refills | Status: AC

## 2018-12-18 MED ORDER — POTASSIUM CHLORIDE 20 MEQ/100ML IV SOLN: 20.0000 meq | Freq: Once | INTRAVENOUS | Status: DC

## 2018-12-18 MED ORDER — TRIAMCINOLONE ACETONIDE 0.5 % EX OINT: 1.0000 "application " | TOPICAL_OINTMENT | Freq: Two times a day (BID) | CUTANEOUS | 0 refills | Status: AC

## 2018-12-18 MED ORDER — ONDANSETRON HCL 4 MG/2ML IJ SOLN
8.0000 mg | Freq: Once | INTRAMUSCULAR | Status: AC
  Administered 2018-12-18: 8 mg via INTRAVENOUS
  Filled 2018-12-18: qty 4

## 2018-12-18 MED ORDER — OLANZAPINE 10 MG PO TABS: 10.0000 mg | ORAL_TABLET | Freq: Every day | ORAL | 0 refills | Status: AC

## 2018-12-18 NOTE — Telephone Encounter (Signed)
Patient coming in at 10 AM

## 2018-12-18 NOTE — Telephone Encounter (Signed)
Daughter called reporting that patient has been vomiting for past 3 days and cannot keep anything down. She would like to be seen today.

## 2018-12-18 NOTE — Progress Notes (Signed)
Patient here today to be evaluated in the symptom management clinic for ongoing nausea and vomiting. Patient states that this has been going on ever since she started the oral trametinib. She is not able to keep any food or liquids down.

## 2018-12-18 NOTE — Patient Instructions (Signed)
I am so sorry you are not feeling well.   Today you were given:  1. 1 liter of IV fluids 2. 10 mg decadron 3.  8 mg Zofran  4. 20 meq of IV potassium  Hold your trametinib until you see Dr. Grayland Ormond next week.    I called a prescription in for Zyprexa 10 mg daily for nausea. You can start this tomorrow. This medication should be taken with your oral chemotherapy when you restart net week.   I called you in a prescription for Kenalog cream for your rash you can use as needed.  I called you in a prescription for potassium. Start taking tomorrow for 14 days.   Return to clinic next week to see Dr. Grayland Ormond.  Please call clinic with any additional questions.   Faythe Casa, NP 12/18/2018 2:38 PM

## 2018-12-18 NOTE — Progress Notes (Signed)
Symptom Management Consult note [facility]  [phone] [phone]  Patient Care Team: Letta Median, MD as PCP - General (Family Medicine) Clent Jacks, RN as Registered Nurse Rico Junker, RN as Registered Nurse Theodore Demark, RN as Registered Nurse   Name of the patient: Elizabeth Davenport  [id number]  1955/09/19   Date of visit: 12/18/2018  Diagnosis: 1. Hypokalemia - sodium chloride 0.9 % 250 mL with potassium chloride 20 mEq infusion  2. Flank pain - Urinalysis, Complete w Microscopic   Chief Complaint: Nausea/vomiting  Current Treatment: Trametinib 2 mg monotherapy.   Oncology History:  Oncology History Overview Note  Elizabeth Davenport, presented as a 63 year old G2, P2 Hispanic female who developed light vaginal bleeding in early 2016.  Reports menopause in 2014.  Dr. Enzo Bi did a Pap that was normal.  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  Focally ER PR negative.  CT scan on 09/22/2014 abdomen/pelvis negative for metastatic disease.  She was referred to gynecologic and exam of cervix was normal.  Slides were reviewed at [facility] and read as probable endometrial primary.  She underwent TLH-BSO on 11/04/2014 at [facility].  Right ovarian excresence noted at surgery.  Survey of the rest of the abdomen, including omentum, and right diaphragm was negative.  Further staging was not performed due to patient's refusal to accept blood products that she is a Jehovah's Witness.  Pathology at [facility] showed a 2.6 cm grade 1 endometrial cancer without LVSI or cervical involvement.  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  No spread noted but washings were positive.  In view of the path report, suggestive of two primaries, pathology was reviewed at [facility].  The review confirmed that the uterine tumor is in early grade 1 endometrial adenocarcinoma.  However the ovarian excresences (were  read as low-grade serous carcinoma) were interpreted as serous borderline tumors by [facility] pathologists (multiple).  The recommendation was for surveillance.  CA 125 05/05/2015 8.1 11/03/2015 12.7 05/10/2016 16.2 11/08/2016 15.4 05/23/2017 30.6 07/09/2017 25.2  On 10/31/2017 she presented to [facility] complaining of left lower quadrant abdominal pain which showed interval development of omental and peritoneal implants/metastatic disease and small amount of ascites with multiple small nodularities in the left upper quadrant including the left hemidiaphragm consistent with peritoneal implants.  Mildly rounded nodular density/lymph nodes at the cardiophrenic angles new and concerning for metastatic disease.  CT-guided biopsy performed. Pathology: A.  Omentum, left anterior abdomen-positive for involvement by epithelial neoplasm of gynecologic origin  11/14/2017 204.4  12/13/2017-underwent diagnostic laparoscopy, omentectomy, optimally debulked, for removal of mass.  Pathology-low-grade serous carcinoma in background of implants of serous borderline tumor.  Positive cytology ' suspicious for malignancy'.  Recommended to start chemotherapy followed by hormonal therapy.    01/22/2018 322.0 02/05/2018 314.0 02/26/2018 75.7  She completed 3 cycles of adjuvant carboplatin-Taxol on 02/26/2018.  Was started on letrozole, but on 04/01/2018 call the clinic and refused to take more due to bone and low back pain.  Plan was to washout and start anastrozole.  Trametinib was discussed if progressive disease.   03/19/2018 22.1  Presented to ER on 04/05/2018 complaining of low back pain and urinary frequency.  She was treated for possible UTI.  CT scan showed diffuse nodular infiltration throughout the mesentery and omentum consistent with peritoneal carcinomatosis. Started anastrozole.   04/10/2018 93.8 06/03/2018 134.0  Seen in symptom management clinic for abdominal swelling and  discomfort.  Underwent paracentesis which  yielded 1.9 L of fluid on 06/04/2018.Marland Kitchen  Fluid positive for malignancy.  CT chest/abdomen/pelvis on 06/06/2018 showed progression.  CT chest/abdomen/pelvis scan 06/06/2018 showed progression.  Patient required paracentesis.  Fluid positive for malignant cells.   Ovarian carcinoma, unspecified laterality ([place])  11/25/2014 Initial Diagnosis   Ovarian cancer, bilateral     Subjective Data: Elizabeth Davenport is a 63 year old female who presents to symptom management for complaints of intractable nausea and vomiting.  Patient states symptoms began approximately 2 weeks ago; about a week after starting her new oral chemotherapy trametinib.  She has attempted to take trametinib with Zofran without relief of symptoms.  She has not been able to keep down her trametinib or any other medication since Sunday.  She admits to sitting or laying in bed for most of the day because she feels too weak. She admits to dizziness when standing.  Her oral intake continues to decline.  She attempts to drink water but vomits.  She also has developed a rash around her mouth.  It is not bothersome.  She admits to using "a cream" that she found at home but cannot remember the name.  Per patient, it appears to be helping.  She was last evaluated by Dr. Grayland Ormond on 12/03/2018 to assess toleration of trametinib.  She complained of persistent nausea worse after eating, occasional diarrhea, poor appetite, persistent weakness and fatigue and bilateral lower extremity edema.  She was given a prescription for Reglan and encouraged to use Imodium as needed.  ECOG: 1 - Symptomatic but completely ambulatory   The following portions of the patient's history were reviewed and updated as appropriate: allergies, current medications, past family history, past medical history, past social history, past surgical history and problem list. Review of Systems A comprehensive review of systems was negative except for: Cardiovascular: positive for fatigue and  lower extremity edema Gastrointestinal: positive for diarrhea, nausea and vomiting Genitourinary: positive for lower back pain Musculoskeletal: positive for muscle weakness       Objective:    BP 102/72 (BP Location: Right Arm, Patient Position: Sitting) Comment: standing bp 108/75  Pulse (!) 112 Comment: standing pulse 128  Temp 99.2 F (37.3 C) (Tympanic)   Resp 18   Wt 145 lb (65.8 kg)   LMP 11/04/2011   BMI 27.40 kg/m  General appearance: alert, fatigued, no distress and pale Lungs: clear to auscultation bilaterally Heart: tachy prior to iv fluids Abdomen: soft, non-tender; bowel sounds normal; no masses,  no organomegaly Skin: Skin color, texture, turgor normal. No rashes or lesions or acneform rash around mouth Neurologic: Grossly normal    Assessment:  Intractable nausea and vomiting likely due to trametinib.(Nausea and vomiting 34 to 45% incidence)  Acneform rash due to trametinib. (Acneform rash incident greater than 87%)    Plan:  63 year old patient presents to [facility] for complaints of nausea and vomiting unrelieved with anti-emetics.   Progressive low-grade ovarian malignancy: Currently on 2 mg trametinib.  Started approximately 3 weeks ago.  Her Ca1 25 continues to increase and is now representing progression of disease.  Last Ca 125 was 385.  Most recent paracentesis is from May 2020 where 300 mL's of fluid was removed.  She is scheduled to return to clinic on 12/24/2018 for MD assessment and labs.  Intractable nausea and vomiting: This is likely due to trametinib.  Spoke with Ebony Hail our oral chemotherapy pharmacist who recommends adding olanzapine 5 to 10 mg daily or lorazepam 0.5 to 2  mg every 6 hours to help control her nausea.   Flank pain/low grade temp (99.2): Complains of intermittent flank pain/lower back pain.  States she thinks is because laying around more due to weakness from nausea, vomiting and diarrhea.  Will check UA sure to rule out UTI.  Hypokalemia:  Likely due to vomiting and diarrhea.  Encouraged potassium rich diet.  If able to tolerate, potassium tablets as prescribed.  Will give IV potassium today in clinic.  Plan: Stat labs Give 1 l IV fluids Give 10 mg Decadron Give 8 mg Zofran Give 20 meq of Potassium IV.  UA/Urine Culture. Collect Stool: Unable to collect stool in clinic.  Patient instructed to collect at home and bring back.  Disposition: RTC as scheduled on 12/24/2018 for labs and MD assessment. Hold trametinib. Consider dose reduction after symptoms resolve. (can dose reduce by 0.5mg  per oral chemo pharmacist) Pick up new prescriptions of:  Zyprexa 10 mg daily  Potassium 10 mEq daily x14 days  Kenalog cream to use as needed for acneform rash. Continue Imodium for diarrhea.  Greater than 50% was spent in counseling and coordination of care with this patient including but not limited to discussion of the relevant topics above (See A&P) including, but not limited to diagnosis and management of acute and chronic medical conditions.   Faythe Casa, NP 12/18/2018 2:57 PM

## 2018-12-19 LAB — URINE CULTURE

## 2018-12-22 NOTE — Progress Notes (Signed)
Elizabeth Davenport  [phone] [phone]  ID: Acsa Estey OB: [birth year]  MR#: [id number]  WVP#:[id number]  Patient Care Team: Letta Median, MD as PCP - General (Family Medicine) Clent Jacks, RN as Registered Nurse Rico Junker, RN as Registered Nurse Theodore Demark, RN as Registered Nurse  CHIEF COMPLAINT: Progressive low-grade ovarian malignancy  INTERVAL HISTORY: Patient returns to clinic today for repeat laboratory work and discussion on whether to continue treatment.  She discontinued trametinib several weeks ago, but still has bloating, persistent nausea and vomiting, and fatigue.  She continues to have a poor appetite.  She does not complain of pain today.  She has no neurologic complaints.  She denies any recent fevers or illnesses.  She denies any chest pain, shortness of breath, cough, or hemoptysis.  She has no urinary complaints.  Patient feels generally terrible, but offers no further specific complaints today.  REVIEW OF SYSTEMS:   Review of Systems  Constitutional: Positive for malaise/fatigue. Negative for fever and weight loss.  Respiratory: Negative.  Negative for cough and shortness of breath.   Cardiovascular: Positive for leg swelling. Negative for chest pain.  Gastrointestinal: Positive for nausea and vomiting. Negative for abdominal pain, constipation, diarrhea and heartburn.  Genitourinary: Negative.  Negative for dysuria.  Musculoskeletal: Negative.  Negative for back pain.  Skin: Negative.  Negative for rash.  Neurological: Positive for weakness. Negative for dizziness, sensory change, focal weakness and headaches.  Psychiatric/Behavioral: Negative.  The patient is not nervous/anxious.     As per HPI. Otherwise, a complete review of systems is negative.  PAST MEDICAL HISTORY: Past Medical History:  Diagnosis Date  . Arthritis    left knee  . Asthma   . Endometrial cancer ([place]) 11/04/14  . PONV  (postoperative nausea and vomiting)     PAST SURGICAL HISTORY: Past Surgical History:  Procedure Laterality Date  . ABDOMINAL HYSTERECTOMY    . LAPAROSCOPIC BILATERAL SALPINGO OOPHERECTOMY Bilateral 11/04/2014   Procedure: LAPAROSCOPIC BILATERAL SALPINGO OOPHORECTOMY;  Surgeon: Mellody Drown, MD;  Location: [facility] ORS;  Service: Gynecology;  Laterality: Bilateral;  . LAPAROSCOPIC HYSTERECTOMY N/A 11/04/2014   Procedure: HYSTERECTOMY TOTAL LAPAROSCOPIC;  Surgeon: Mellody Drown, MD;  Location: [facility] ORS;  Service: Gynecology;  Laterality: N/A;  . removed mass from thyriod      FAMILY HISTORY: Family History  Problem Relation Age of Onset  . Breast cancer Sister 17  . Asthma Mother   . Coronary artery disease Mother   . Diabetes Brother   . Diabetes Sister   . Diabetes Sister   . Diabetes Brother     ADVANCED DIRECTIVES (Y/N):  N  HEALTH MAINTENANCE: Social History   Tobacco Use  . Smoking status: Former Smoker    Packs/day: 0.25    Years: 8.00    Pack years: 2.00    Types: Cigarettes    Quit date: 06/29/1990    Years since quitting: 28.5  . Smokeless tobacco: Never Used  . Tobacco comment: Smoking History Cigarettes 1 cig per day; quit 22 years ago; smoked for 8 years  Substance Use Topics  . Alcohol use: No    Alcohol/week: 0.0 standard drinks    Comment: occasional   . Drug use: No     Colonoscopy:  PAP:  Bone density:  Lipid panel:  No Known Allergies  Current Outpatient Medications  Medication Sig Dispense Refill  . acetaminophen (TYLENOL) 500 MG tablet Take 500 mg by mouth every 6 (six) hours as needed.    Marland Kitchen  albuterol (PROVENTIL HFA;VENTOLIN HFA) 108 (90 BASE) MCG/ACT inhaler Inhale 2 puffs into the lungs every 6 (six) hours as needed for wheezing or shortness of breath.    . ALPRAZolam (XANAX) 0.25 MG tablet Take 1 tablet (0.25 mg total) by mouth 2 (two) times daily as needed for anxiety. 60 tablet 1  . CONSTULOSE 10 GM/15ML solution TAKE 30ML BY MOUTH  TWICE DAILY FOR 5 DAYS    . fluticasone (FLOVENT HFA) 220 MCG/ACT inhaler Take by mouth.    . metoCLOPramide (REGLAN) 10 MG tablet Take 0.5 tablets (5 mg total) by mouth 4 (four) times daily -  before meals and at bedtime. 60 tablet 1  . OLANZapine (ZYPREXA) 10 MG tablet Take 1 tablet (10 mg total) by mouth at bedtime. Take before takingyour trametinib each day. 10 tablet 0  . ondansetron (ZOFRAN) 8 MG tablet Take 1 tablet (8 mg total) by mouth 2 (two) times daily as needed for refractory nausea / vomiting. 30 tablet 2  . oxyCODONE (OXY IR/ROXICODONE) 5 MG immediate release tablet Take 0.5-1 tablets (2.5-5 mg total) by mouth every 4 (four) hours as needed for severe pain. 30 tablet 0  . pantoprazole (PROTONIX) 40 MG tablet Take 1 tablet (40 mg total) by mouth daily. For acid reflux 90 tablet 3  . polyethylene glycol (MIRALAX) packet Take 17 g by mouth daily. 14 each 0  . potassium chloride (K-DUR) 10 MEQ tablet Take 1 tablet (10 mEq total) by mouth daily. 14 tablet 0  . senna-docusate (SENNA S) 8.6-50 MG tablet Take 1 tablet by mouth daily. 30 tablet 0  . trametinib dimethyl sulfoxide (MEKINIST) 2 MG tablet Take 2 mg by mouth daily. Take 1 hour before or 2 hours after a meal. Store refrigerated in original container.   Medication is scheduled to arrive on 11/20/18 per patient.    . triamcinolone ointment (KENALOG) 0.5 % Apply 1 application topically 2 (two) times daily. Apply to face PRN for rash. 30 g 0   No current facility-administered medications for this visit.     OBJECTIVE: Vitals:   12/24/18 1012  BP: 108/81  Pulse: (!) 134  Temp: 97.6 F (36.4 C)     Body mass index is 27.59 kg/m.    ECOG FS:3 - Symptomatic, >50% confined to bed  General: No acute distress. Eyes: Pink conjunctiva, anicteric sclera. HEENT: Normocephalic, moist mucous membranes, clear oropharnyx. Lungs: Clear to auscultation bilaterally. Heart: Regular rate and rhythm. No rubs, murmurs, or gallops. Abdomen:  Mildly distended, nontender.   Musculoskeletal: No edema, cyanosis, or clubbing. Neuro: Alert, answering all questions appropriately. Cranial nerves grossly intact. Skin: No rashes or petechiae noted. Psych: Normal affect.   LAB RESULTS:  Lab Results  Component Value Date   NA 133 (L) 12/24/2018   K 3.0 (L) 12/24/2018   CL 95 (L) 12/24/2018   CO2 26 12/24/2018   GLUCOSE 93 12/24/2018   BUN 7 (L) 12/24/2018   CREATININE 0.62 12/24/2018   CALCIUM 8.0 (L) 12/24/2018   PROT 8.2 (H) 12/24/2018   ALBUMIN 2.0 (L) 12/24/2018   AST 36 12/24/2018   ALT 24 12/24/2018   ALKPHOS 182 (H) 12/24/2018   BILITOT 1.0 12/24/2018   GFRNONAA >60 12/24/2018   GFRAA >60 12/24/2018    Lab Results  Component Value Date   WBC 9.1 12/24/2018   NEUTROABS 7.9 (H) 12/24/2018   HGB 8.3 (L) 12/24/2018   HCT 26.7 (L) 12/24/2018   MCV 87.5 12/24/2018   PLT 290 12/24/2018  STUDIES: No results found.  ASSESSMENT: Progressive low-grade ovarian malignancy. PLAN:   1.  Progressive low-grade ovarian malignancy: CT scan results from September 01, 2018 reviewed independently with mild prominence of mesenteric and retroperitoneal lymph nodes unchanged as well as increased prominence of pelvic lymph nodes possibly representing progression of disease.  Her CA-125 continues to increase and is now 385.  Her most recent paracentesis on Nov 05, 2018 only removed 300 mL fluid.  Given patient's significantly reduced performance status and intoleration of  trimetinib 2 mg, she has elected to discontinue treatment altogether.  After lengthy discussion with the patient and her daughter she has agreed to enroll in hospice.  No further follow-up has been scheduled.  2.  History of endometrial cancer: Patient had total hysterectomy.  Imaging as above. 3.  Anemia: Hemoglobin is decreased at 8.3.  No further blood draws necessary. 4.  Leukopenia: Resolved.   5.  Anxiety: Continue Xanax as needed.  Patient was given a refill  today. 6.  Nausea/vomiting: Continue current medications.  Hospice as above. 7.  Diarrhea: Patient does not complain of this today. 8.  Disposition: Proceed with hospice at home as above.   The entire visit was done in the presence of an interpreter.  Patient expressed understanding and was in agreement with this plan. She also understands that She can call clinic at any time with any questions, concerns, or complaints.   Cancer Staging Borderline epithelial neoplasm of ovary Staging form: Ovary, Fallopian Tube, and Primary Peritoneal Carcinoma, AJCC 8th Edition - Clinical stage from 01/11/2018: FIGO Stage III (cT3, cN0, cM0) - Signed by Lloyd Huger, MD on 01/11/2018  Endometrial cancer [facility]) Staging form: Corpus Uteri - Carcinoma, AJCC 7th Edition - Clinical: Stage I (T1, N0, M0) - Unsigned  Ovarian carcinoma, unspecified laterality ([place]) Staging form: Ovary, AJCC 7th Edition - Clinical: Stage Unknown (T1c, NX, M0) - Signed by Forest Gleason, MD on 11/27/2014   Lloyd Huger, MD   12/25/2018 7:05 AM

## 2018-12-23 ENCOUNTER — Other Ambulatory Visit: Payer: Self-pay

## 2018-12-24 ENCOUNTER — Encounter: Payer: Self-pay | Admitting: Oncology

## 2018-12-24 ENCOUNTER — Other Ambulatory Visit: Payer: Self-pay

## 2018-12-24 ENCOUNTER — Inpatient Hospital Stay: Payer: Self-pay

## 2018-12-24 ENCOUNTER — Inpatient Hospital Stay (HOSPITAL_BASED_OUTPATIENT_CLINIC_OR_DEPARTMENT_OTHER): Payer: Self-pay | Admitting: Oncology

## 2018-12-24 VITALS — BP 108/81 | HR 134 | Temp 97.6°F | Ht 61.0 in | Wt 146.0 lb

## 2018-12-24 DIAGNOSIS — R531 Weakness: Secondary | ICD-10-CM

## 2018-12-24 DIAGNOSIS — R42 Dizziness and giddiness: Secondary | ICD-10-CM

## 2018-12-24 DIAGNOSIS — M129 Arthropathy, unspecified: Secondary | ICD-10-CM

## 2018-12-24 DIAGNOSIS — C562 Malignant neoplasm of left ovary: Secondary | ICD-10-CM

## 2018-12-24 DIAGNOSIS — R609 Edema, unspecified: Secondary | ICD-10-CM

## 2018-12-24 DIAGNOSIS — J45909 Unspecified asthma, uncomplicated: Secondary | ICD-10-CM

## 2018-12-24 DIAGNOSIS — Z8542 Personal history of malignant neoplasm of other parts of uterus: Secondary | ICD-10-CM

## 2018-12-24 DIAGNOSIS — R112 Nausea with vomiting, unspecified: Secondary | ICD-10-CM

## 2018-12-24 DIAGNOSIS — C561 Malignant neoplasm of right ovary: Secondary | ICD-10-CM

## 2018-12-24 DIAGNOSIS — R600 Localized edema: Secondary | ICD-10-CM

## 2018-12-24 DIAGNOSIS — C786 Secondary malignant neoplasm of retroperitoneum and peritoneum: Secondary | ICD-10-CM

## 2018-12-24 DIAGNOSIS — R110 Nausea: Secondary | ICD-10-CM

## 2018-12-24 DIAGNOSIS — F419 Anxiety disorder, unspecified: Secondary | ICD-10-CM

## 2018-12-24 DIAGNOSIS — Z87891 Personal history of nicotine dependence: Secondary | ICD-10-CM

## 2018-12-24 DIAGNOSIS — Z79899 Other long term (current) drug therapy: Secondary | ICD-10-CM

## 2018-12-24 DIAGNOSIS — D3910 Neoplasm of uncertain behavior of unspecified ovary: Secondary | ICD-10-CM

## 2018-12-24 DIAGNOSIS — D649 Anemia, unspecified: Secondary | ICD-10-CM

## 2018-12-24 DIAGNOSIS — R5383 Other fatigue: Secondary | ICD-10-CM

## 2018-12-24 DIAGNOSIS — E876 Hypokalemia: Secondary | ICD-10-CM

## 2018-12-24 DIAGNOSIS — Z803 Family history of malignant neoplasm of breast: Secondary | ICD-10-CM

## 2018-12-24 DIAGNOSIS — Z90722 Acquired absence of ovaries, bilateral: Secondary | ICD-10-CM

## 2018-12-24 DIAGNOSIS — Z90710 Acquired absence of both cervix and uterus: Secondary | ICD-10-CM

## 2018-12-24 DIAGNOSIS — R197 Diarrhea, unspecified: Secondary | ICD-10-CM

## 2018-12-24 DIAGNOSIS — R630 Anorexia: Secondary | ICD-10-CM

## 2018-12-24 DIAGNOSIS — C569 Malignant neoplasm of unspecified ovary: Secondary | ICD-10-CM

## 2018-12-24 DIAGNOSIS — R21 Rash and other nonspecific skin eruption: Secondary | ICD-10-CM

## 2018-12-24 DIAGNOSIS — R109 Unspecified abdominal pain: Secondary | ICD-10-CM

## 2018-12-24 LAB — CBC WITH DIFFERENTIAL/PLATELET
Abs Immature Granulocytes: 0.05 10*3/uL (ref 0.00–0.07)
Basophils Absolute: 0 10*3/uL (ref 0.0–0.1)
Basophils Relative: 0 %
Eosinophils Absolute: 0 10*3/uL (ref 0.0–0.5)
Eosinophils Relative: 0 %
HCT: 26.7 % — ABNORMAL LOW (ref 36.0–46.0)
Hemoglobin: 8.3 g/dL — ABNORMAL LOW (ref 12.0–15.0)
Immature Granulocytes: 1 %
Lymphocytes Relative: 5 %
Lymphs Abs: 0.5 10*3/uL — ABNORMAL LOW (ref 0.7–4.0)
MCH: 27.2 pg (ref 26.0–34.0)
MCHC: 31.1 g/dL (ref 30.0–36.0)
MCV: 87.5 fL (ref 80.0–100.0)
Monocytes Absolute: 0.8 10*3/uL (ref 0.1–1.0)
Monocytes Relative: 8 %
Neutro Abs: 7.9 10*3/uL — ABNORMAL HIGH (ref 1.7–7.7)
Neutrophils Relative %: 86 %
Platelets: 290 10*3/uL (ref 150–400)
RBC: 3.05 MIL/uL — ABNORMAL LOW (ref 3.87–5.11)
RDW: 20.2 % — ABNORMAL HIGH (ref 11.5–15.5)
WBC: 9.1 10*3/uL (ref 4.0–10.5)
nRBC: 0 % (ref 0.0–0.2)

## 2018-12-24 LAB — IRON AND TIBC
Iron: 23 ug/dL — ABNORMAL LOW (ref 28–170)
Saturation Ratios: 23 % (ref 10.4–31.8)
TIBC: 101 ug/dL — ABNORMAL LOW (ref 250–450)
UIBC: 78 ug/dL

## 2018-12-24 LAB — FERRITIN: Ferritin: 4351 ng/mL — ABNORMAL HIGH (ref 11–307)

## 2018-12-24 LAB — COMPREHENSIVE METABOLIC PANEL
ALT: 24 U/L (ref 0–44)
AST: 36 U/L (ref 15–41)
Albumin: 2 g/dL — ABNORMAL LOW (ref 3.5–5.0)
Alkaline Phosphatase: 182 U/L — ABNORMAL HIGH (ref 38–126)
Anion gap: 12 (ref 5–15)
BUN: 7 mg/dL — ABNORMAL LOW (ref 8–23)
CO2: 26 mmol/L (ref 22–32)
Calcium: 8 mg/dL — ABNORMAL LOW (ref 8.9–10.3)
Chloride: 95 mmol/L — ABNORMAL LOW (ref 98–111)
Creatinine, Ser: 0.62 mg/dL (ref 0.44–1.00)
GFR calc Af Amer: >60 mL/min (ref >60)
GFR calc non Af Amer: >60 mL/min (ref >60)
Glucose, Bld: 93 mg/dL (ref 70–99)
Potassium: 3 mmol/L — ABNORMAL LOW (ref 3.5–5.1)
Sodium: 133 mmol/L — ABNORMAL LOW (ref 135–145)
Total Bilirubin: 1 mg/dL (ref 0.3–1.2)
Total Protein: 8.2 g/dL — ABNORMAL HIGH (ref 6.5–8.1)

## 2018-12-24 MED ORDER — OXYCODONE HCL 5 MG PO TABS: 2.5000 mg | ORAL_TABLET | ORAL | 0 refills | Status: AC | PRN

## 2018-12-24 MED ORDER — ALPRAZOLAM 0.25 MG PO TABS: 0.2500 mg | ORAL_TABLET | Freq: Two times a day (BID) | ORAL | 1 refills | Status: DC | PRN

## 2018-12-24 NOTE — Progress Notes (Signed)
Patient stated that she has had abdominal pain and bloating, no appetite, low grade fever, chills, nausea, vomiting, constipation and SOB. Patient stated that she is needing a refill on her Xanax and Oxycodone.

## 2019-01-01 ENCOUNTER — Other Ambulatory Visit: Payer: Self-pay | Admitting: *Deleted

## 2019-01-01 MED ORDER — ALPRAZOLAM 0.25 MG PO TABS: 0.2500 mg | ORAL_TABLET | Freq: Two times a day (BID) | ORAL | 1 refills | Status: AC | PRN

## 2019-01-01 NOTE — Telephone Encounter (Signed)
Hospice called requesting refill of patient Alprazolam. Crystal informed that patient just got a 30 day supply on 12/24/18. She will discuss with patient.

## 2019-01-01 NOTE — Telephone Encounter (Signed)
Patient is planning to go away for a month and needs a refill of her Alprazolam she will also need a note to take with her on the plane.

## 2019-01-08 ENCOUNTER — Telehealth: Payer: Self-pay | Admitting: *Deleted

## 2019-01-08 NOTE — Telephone Encounter (Signed)
Patient is on vacation in [place] and since she is a self pay with hospice, they are discharging her from services which they cannot give her while out of the country. They will be glad to serve again once she returns

## 2019-02-05 ENCOUNTER — Inpatient Hospital Stay: Payer: Self-pay | Attending: Obstetrics and Gynecology

## 2019-02-05 NOTE — Progress Notes (Deleted)
Gynecologic Oncology Interval Note  Referring Provider: Delight Hoh, MD  Chief Complaint: Recurrent low grade serous ovarian cancer  Subjective:  Elizabeth Davenport, 63 year old female, diagnosed with recurrent low grade serous ovarian cancer, who returns to clinic today for follow-up. She completed 3 cycles of adjuvant carboplatin-Taxol on 02/26/18 followed by letrozole, switched to anastrozole but discontinued due to intolerance/side effects.   Imaging on 06/06/2018 showed progression.CA 125 was 134 at that time and she reinitiated carbo-taxol on 06/14/2018 given prior response. She completed 4 cycles and presented to ER for abdominal pain on 09/01/2018 with imaging as below:   CT 09/01/2018- CT Abdomen/Pelvis W Contrast 1. 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 Similar to previous study. 2. Mild prominence of mesenteric and retroperitoneal lymph nodes, similar to prior study. Increased prominence of pelvic lymph nodes, possibly representing progression of metastatic disease. Right cardiophrenic angle nodes seen on previous study are decreased in size. 3. No evidence of bowel obstruction or inflammation.  CA 125 was 82.9 (09/02/2018)  She had paracentesis on 09/03/2018 which yielded 360 cc of fluid. Hospice was recommended but patient wished to continue treatment and she received last cycle of carbo-taxol chemotherapy on 09/04/2018 for total of 5 cycles. She was referred to palliative care for discussion of goals. She reported considering stopping treatment and said primary goal was comfort and quality of life but she opted to continue treatment at that time. However, she cancelled subsequent appointments and did not receive additional chemotherapy or imaging.   She represented to clinic on 11/04/2018 for acute abdominal pain. CA 125 was 138.0 and pain thought to be secondary to malignancy and  recurrent ascites. She underwent paracentesis on 11/05/2018 which yielded 300cc of fluid. She was started re-started on oxycodone which was previously discontinued due to nausea. Hospice and community based palliative care were recommended and she accepted referral to palliative care but asked to see Dr. Theora Gianotti today for discussion of treatment options versus hospice at home.   Today she reports that pain was improved with oxycodone but she did not take medication this morning. She reports feeling inflammed and pain is worse with eating/drinking. She feels fatigued and reports intermittent constipation. She has ongoing numbness in her feet since chemotherapy that doesn't interfere with her ADLs. She is interested in starting treatment.   Oncology History: Elizabeth Davenport is a 63 y.o. G2P2 woman who developed light vaginal bleeding in early 2016 after undergoing LMP/menopause in 2014. Dr DeFrancesco did PAP that was normal.  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  CT scan 09/22/14 abd/pelvis negative for metastatic disease.  She was referred to [place] and on exam the cervix was normal.  Slides were reviewed at [facility] and read as probable endometrial primary.  She underwent TLH/BSO Nov 04, 2014 at [facility].  Right ovarian excresence noted at surgery.  Survey of the rest of the abdomen, including the omentum and right diaphragm was negative.  Further staging not performed due to patient's refusal to accept blood products as she is a Jehovah's witness.  Path at [facility] showed 2.6 cm grade 1 endometrial cancer without LVSI or cervical involvement.  The ovaries both were found to have low grade serous cancers of 7 and 8 mm.  No spread noted, but washings were positive.   In view of the path report suggesting primary endometrial and uterine cancers I has the  pathology reviewed at [facility].  The review confirmed that the uterine tumor is an early grade 1 endometrial  adenocarcinoma.  However, the ovarian excresences that were read as low grade serous carcinoma were interpreted as serous borderline tumors by the [facility] Pathologists (several of them reviewed the case). The recommendation was for surveillance.   CA125 has been followed:  05/05/15 8.1 11/03/15 12.7 05/10/16 16.2 11/08/2016 15.4 05/23/17 30.6 07/09/2017 25.2 11/14/2017 204.4 01/22/2018 322.0 02/05/2018 314.0 02/26/2018 75.7 03/19/2018 22.1 04/10/2018 93.8  On 10/31/17 she presented to [facility] ED complaining of left lower quadrant abdominal pain.  10/31/17- CT Abdomen Pelvis W Contrast- 1. Interval development of omental and peritoneal implants/metastatic disease and small amount of ascites. Multiple small nodularities in the left upper quadrant under the left hemidiaphragm (series 2, image 23 consistent with peritoneal implants.2. Mildly rounded nodular densities/lymph nodes at the cardiophrenic angles, new compared to prior CT and concerning for metastatic disease. 3. Sigmoid diverticulosis. No bowel obstruction or active inflammation. Normal appendix.  Diagnosis: A.  Omentum, left anterior abdomen; CT-guided biopsy: -Positive for involvement by epithelial neoplasm of gynecologic origin  12/13/2017 she underwent diagnostic laparoscopy, omentectomy for removal of mass. Pathology Low grade serous carcinoma in a background of implants of serous borderline tumor. Positive cytology "SUSPICIOUS FOR MALIGNANCY". Recommended to start chemotherapy carbo/taxol followed by hormonal therapy which she did under the care of Dr Grayland Ormond.   Recently she went to the ER on 04/05/2018 for pain lower back, abdominal bloating, and frequent urination. She was diagnosed with possible UTI and treated with cephalexin. CT was also ordered.   CT scan 04/05/2018  IMPRESSION: 1. No renal or ureteral stone or obstruction. 2. No evidence of bowel obstruction or inflammation. 3. Diffuse nodular infiltration throughout the  mesentery and omentum consistent with peritoneal carcinomatosis. Increased density in the pelvis likely represents free fluid related to metastatic disease, possibly with hemorrhage or proteinaceous material. 4. Prominence of retrocrural and right cardiophrenic angle lymph nodes, similar to prior study, likely metastatic. 5. Probable hepatic cysts.  She completed chemotherapy with Dr. Grayland Ormond with 3 cycles of adjuvant carboplatinum and Taxol on February 26, 2018. She was started letrozole but on 04/01/2018 called the clinic and refused to take more due to bone pain and low back pain. The plan was to "wash out" the drug and then start another anastrozole.  CT C/A/P W Contrast- 06/06/18 1. Mildly enlarged lymph nodes in the LOWER mediastinum in the RIGHT paracardiac location, some of which which have slightly increased in size since the CT 04/05/2018. 2. No acute cardiopulmonary disease. 3. Moderate amount of ascites, increased since the CT 04/05/2018. 4. Mildly enlarged LEFT periaortic lymph node at the level of the renal hila, measured above. Numerous small lymph nodes elsewhere in the abdomen including the gastrohepatic ligament and porta hepatis. These nodes have increased in size since the CT 04/05/2018 and indicate metastatic disease. 5. Progression of omental, peritoneal and mesenteric metastatic disease since the CT 04/05/2018.  CA 125 06/03/2018 134.0  She reinitiated adjuvant carboplatin-Taxol on 06/14/2018. Treatment complicated by weakness and anemia. 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 She has been agreeable to iron. She was on gabapentin for peripheral neuropathy.   Problem List: Patient Active Problem List   Diagnosis Date Noted  . Malignant ascites 11/05/2018  . Goals of care, counseling/discussion 11/05/2018  . Refusal of blood transfusions as patient is Jehovah's Witness 11/30/2017  . 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 History of  endometrial cancer 11/08/2016  . Serous cystadenoma with borderline malignant features ([place]) 11/08/2016  . Ovarian carcinoma, unspecified laterality ([place]) 11/25/2014  . Endometrial cancer ([place]) 11/04/2014  . S/P laparoscopic hysterectomy 11/04/2014    Past Medical History: Past Medical History:  Diagnosis Date  . Arthritis    left knee  . Asthma   . Endometrial cancer ([place]) 11/04/14  . PONV (postoperative nausea and vomiting)     Past Surgical History: Past Surgical History:  Procedure Laterality Date  . ABDOMINAL HYSTERECTOMY    . LAPAROSCOPIC BILATERAL SALPINGO OOPHERECTOMY Bilateral 11/04/2014   Procedure: LAPAROSCOPIC BILATERAL SALPINGO OOPHORECTOMY;  Surgeon: Mellody Drown, MD;  Location: [facility] ORS;  Service: Gynecology;  Laterality: Bilateral;  . LAPAROSCOPIC HYSTERECTOMY N/A 11/04/2014   Procedure: HYSTERECTOMY TOTAL LAPAROSCOPIC;  Surgeon: Mellody Drown, MD;  Location: [facility] ORS;  Service: Gynecology;  Laterality: N/A;  . removed mass from thyriod      Family History: Family History  Problem Relation Age of Onset  . Breast cancer Sister 69  . Asthma Mother   . Coronary artery disease Mother   . Diabetes Brother   . Diabetes Sister   . Diabetes Sister   . Diabetes Brother     Social History: Social History   Socioeconomic History  . Marital status: Married    Spouse name: Not on file  . Number of children: Not on file  . Years of education: Not on file  . Highest education level: Not on file  Occupational History  . Not on file  Social Needs  . Financial resource strain: Not on file  . Food insecurity    Worry: Not on file    Inability: Not on file  . Transportation needs    Medical: Not on file    Non-medical: Not on file  Tobacco Use  . Smoking status: Former Smoker    Packs/day: 0.25    Years: 8.00    Pack years: 2.00    Types: Cigarettes    Quit date: 06/29/1990    Years since quitting: 28.6  . Smokeless tobacco: Never Used  . Tobacco  comment: Smoking History Cigarettes 1 cig per day; quit 22 years ago; smoked for 8 years  Substance and Sexual Activity  . Alcohol use: No    Alcohol/week: 0.0 standard drinks    Comment: occasional   . Drug use: No  . Sexual activity: Yes    Partners: Male  Lifestyle  . Physical activity    Days per week: Not on file    Minutes per session: Not on file  . Stress: Not on file  Relationships  . Social Herbalist on phone: Not on file    Gets together: Not on file    Attends religious service: Not on file    Active member of club or organization: Not on file    Attends meetings of clubs or organizations: Not on file    Relationship status: Not on file  . Intimate partner violence    Fear of current or ex partner: Not on file    Emotionally abused: Not on file    Physically abused: Not on file    Forced sexual activity: Not on file  Other Topics Concern  . Not on file  Social History Narrative  . Not on file    Allergies: No Known Allergies  Current Medications: Current Outpatient Medications  Medication Sig Dispense Refill  . acetaminophen (TYLENOL) 500 MG tablet Take 500 mg by mouth  every 6 (six) hours as needed.    Marland Kitchen albuterol (PROVENTIL HFA;VENTOLIN HFA) 108 (90 BASE) MCG/ACT inhaler Inhale 2 puffs into the lungs every 6 (six) hours as needed for wheezing or shortness of breath.    . ALPRAZolam (XANAX) 0.25 MG tablet Take 1 tablet (0.25 mg total) by mouth 2 (two) times daily as needed for anxiety. 60 tablet 1  . CONSTULOSE 10 GM/15ML solution TAKE 30ML BY MOUTH TWICE DAILY FOR 5 DAYS    . fluticasone (FLOVENT HFA) 220 MCG/ACT inhaler Take by mouth.    . metoCLOPramide (REGLAN) 10 MG tablet Take 0.5 tablets (5 mg total) by mouth 4 (four) times daily -  before meals and at bedtime. 60 tablet 1  . OLANZapine (ZYPREXA) 10 MG tablet Take 1 tablet (10 mg total) by mouth at bedtime. Take before takingyour trametinib each day. 10 tablet 0  . ondansetron (ZOFRAN) 8 MG  tablet Take 1 tablet (8 mg total) by mouth 2 (two) times daily as needed for refractory nausea / vomiting. 30 tablet 2  . oxyCODONE (OXY IR/ROXICODONE) 5 MG immediate release tablet Take 0.5-1 tablets (2.5-5 mg total) by mouth every 4 (four) hours as needed for severe pain. 30 tablet 0  . pantoprazole (PROTONIX) 40 MG tablet Take 1 tablet (40 mg total) by mouth daily. For acid reflux 90 tablet 3  . polyethylene glycol (MIRALAX) packet Take 17 g by mouth daily. 14 each 0  . potassium chloride (K-DUR) 10 MEQ tablet Take 1 tablet (10 mEq total) by mouth daily. 14 tablet 0  . senna-docusate (SENNA S) 8.6-50 MG tablet Take 1 tablet by mouth daily. 30 tablet 0  . trametinib dimethyl sulfoxide (MEKINIST) 2 MG tablet Take 2 mg by mouth daily. Take 1 hour before or 2 hours after a meal. Store refrigerated in original container.   Medication is scheduled to arrive on 11/20/18 per patient.    . triamcinolone ointment (KENALOG) 0.5 % Apply 1 application topically 2 (two) times daily. Apply to face PRN for rash. 30 g 0   No current facility-administered medications for this visit.     Review of Systems General:  fatigue Skin: no complaints Eyes: no complaints HEENT: no complaints Breasts: no complaints Pulmonary: no complaints Cardiac: no complaints Gastrointestinal: decreased appetite, bloating, nausea, abdominal pain/bloating/distension, constipation Genitourinary/Sexual: no complaints Ob/Gyn: no complaints Musculoskeletal: upper back pain  Hematology: no complaints Neurologic/Psych: peripheral neuropathy    Objective:  Physical Examination:  There were no vitals filed for this visit. There is no height or weight on file to calculate BMI.  LMP 11/04/2011   ECOG Performance Status: 2 to 3  GENERAL: Chronically ill/fatigued appearing female. Appears stated age. NAD.  HEENT:  Sclera clear. Anicteric NODES:  Negative axillary, supraclavicular, inguinal lymph node survery LUNGS:  Clear to  auscultation bilaterally.   HEART:  Regular rate and rhythm.  ABDOMEN:  Rounded. Tender diffusely. No RGR. No tense ascites s/p paracentesis.  No hernias, incisions well healed. EXTREMITIES:  No peripheral edema. Atraumatic. No cyanosis SKIN:  Clear with no obvious rashes or skin changes.  NEURO:  Nonfocal. Well oriented.  Appropriate affect.  Pelvic: Exam chaperoned by NP. Vulva: normal appearing without masses, tenderness or lesions. Vagina: 2.5-3cm nodule left vaginal fornix and 2 cm nodule, mobile on right. Adnexa: surgically absent; Uterus: surgically absent, vaginal cuff well healed; Cervix: absent; Rectal: not indicated   Assessment:  Elizabeth Davenport is a 63 y.o. 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 She was managed expectantly.  Diagnosed with low-grade serous cancer s/p laparoscopic omentectomy 12/13/2017 with optimal debulking surgery followed by 3 cycles of carbo-taxol and letrozole (switched to anastrozole d/t side effects). Imaging on 06/06/18 consistent with progression. CA 125 was elevated also. She re-initiated carbo-taxol on 06/14/18 since and has evidence of clinical progression and increasing CA125.   Medical cormorbities: Obesity.   Jehovah's witness.  Plan:   Problem List Items Addressed This Visit    None     We discussed the current findings and recommendations with the patient and family today using an interpreter.    She is interested in further treatment but she has a borderline performance status. She is able to dress/bathe her self; her husband makes her meals as she is too weak; she spends over 50% waking hours in bed but that is due to pain and she does not take her pain medication appropriately. I suspect if she was adherent to her pain medication her performance status would be improved.    Trametinib is a therapeutic option that has been shown to have efficacy in low grade serous cancers. The toxicity profile can be challenging and toxicities includes diarrhea, and dermatologic toxicity most commonly. We provided information about the drug and she will follow up with Dr. Grayland Ormond and his team to discuss trametinib next week.   She will return to our clinic in 3-4 months.   With regard to goals of care she was previously DNAR. We reviewed that if she has progressive disease despite the treatment change to trametinib or if the side effects are not tolerable to transition to Hospice. Recommendation to review further goals of care with Dr. Grayland Ormond. Continue Palliative care for symptom support.   A total of 25 minutes were spent with the patient/family today; >50% was spent in education, counseling and coordination of care for low grade serous ovarian cancer. The visit was performed with Lockie Mola, the interpreter.   Beckey Rutter, DNP, AGNP-C [place] at [facility] [phone] (work cell) [phone] (office)  I personally interviewed and examined the patient including performance status assessment, general, HEENT, abdomen, and pelvic exams. I developed the plan of care and provided counseling. Patient questions were answered with the assistance of the interpreter.   A total of at least 40 minutes were spent with the patient/family today; >50% was spent in education, counseling and coordination of care for ovarian cancer.  Verlon Au, NP     CC:  Dr. Rubie Maid

## 2019-02-11 ENCOUNTER — Telehealth: Payer: Self-pay

## 2019-02-11 NOTE — Telephone Encounter (Signed)
Missed 6/12 gyn onc appointment. Noted on 6/30 she had elected to stop all treatment and has enrolled with hospice. Gyn onc appointment not rescheduled at this time.

## 2019-02-25 DEATH — deceased

## 2019-05-12 IMAGING — CT CT ABD-PELV W/ CM
2 of 5 series · 15 of 46 positions shown, 17 images · IV contrast (APPLIED)
Comparison: 06/06/2018

CLINICAL DATA: Bilateral lower abdominal pain, worse on the left.
History of ovarian cancer on chemotherapy, last 2 weeks ago.
Increased pain today.

EXAM:
CT ABDOMEN AND PELVIS WITH CONTRAST
TECHNIQUE: Multidetector CT imaging of the abdomen and pelvis was performed
using the standard protocol following bolus administration of
intravenous contrast.
CONTRAST:  100mL OMNIPAQUE IOHEXOL 300 MG/ML  SOLN

[Series 2: routine abd/pel with · axial · 0.71mm/px · z∈[-1127,-702]mm · 12 of 95 slices shown, 14 images]
[im 5/95  soft-tissue]
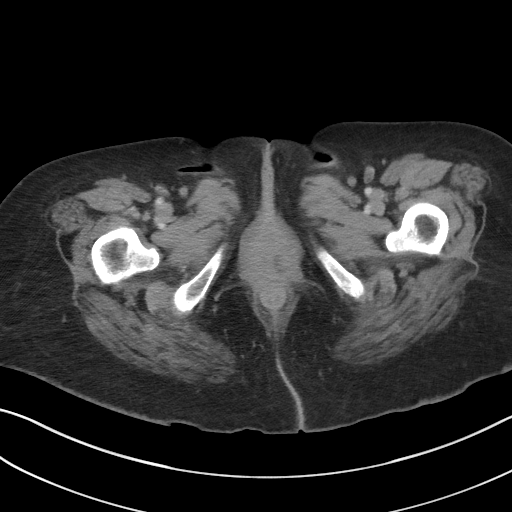
[im 5/95  bone]
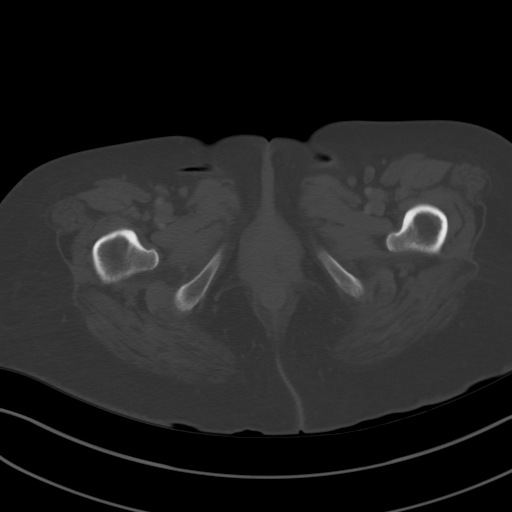
[im 15/95  soft-tissue]
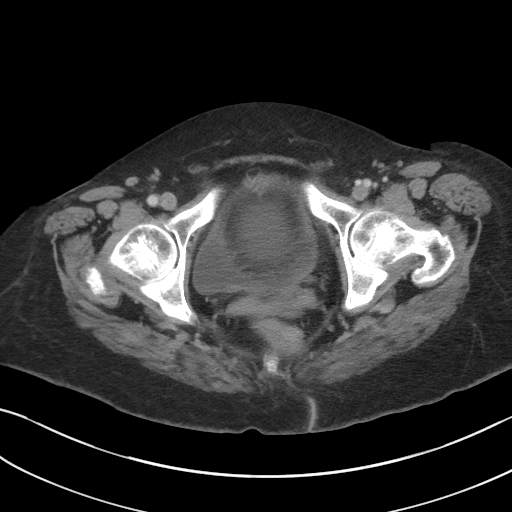
[im 20/95  soft-tissue]
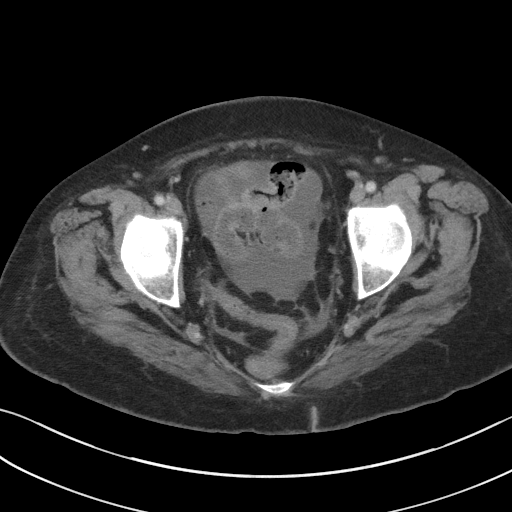
[im 30/95  soft-tissue]
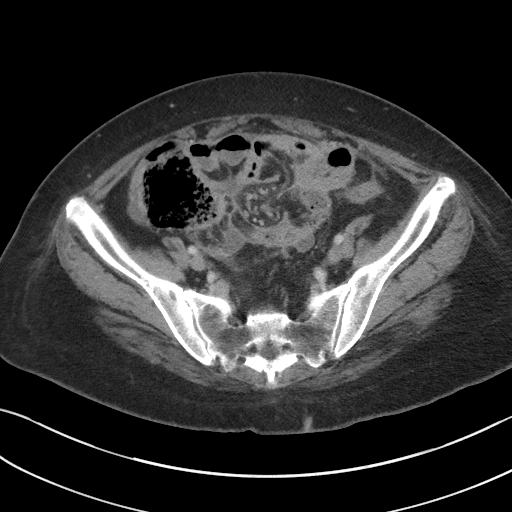
[im 35/95  soft-tissue]
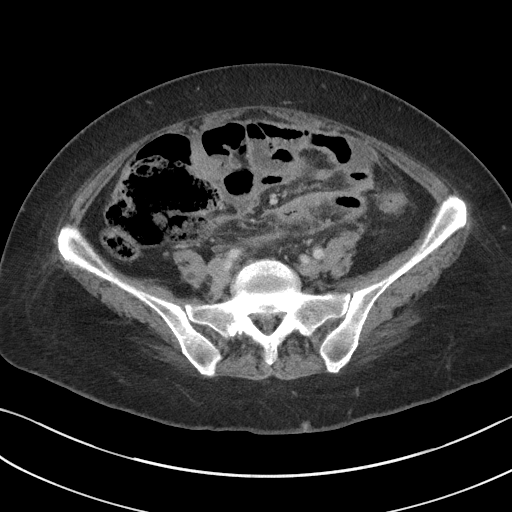
[im 45/95  soft-tissue]
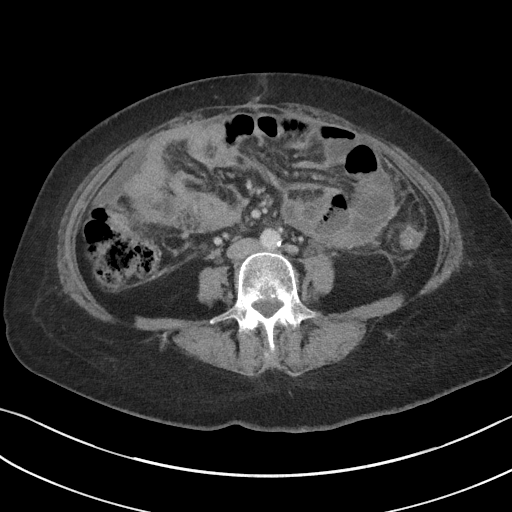
[im 50/95  soft-tissue]
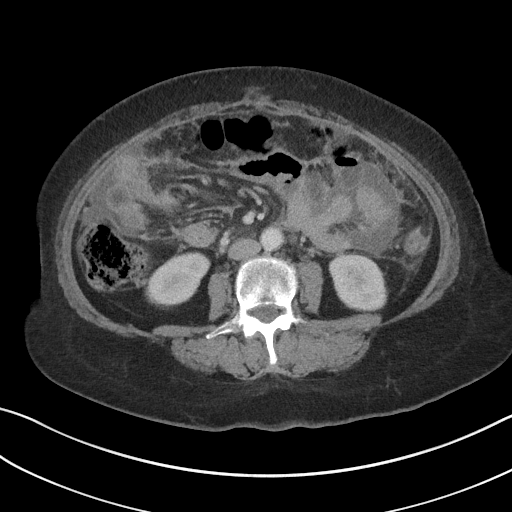
[im 60/95  soft-tissue]
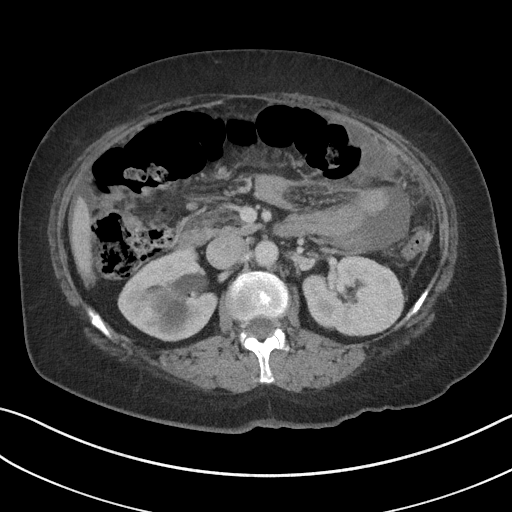
[im 65/95  soft-tissue]
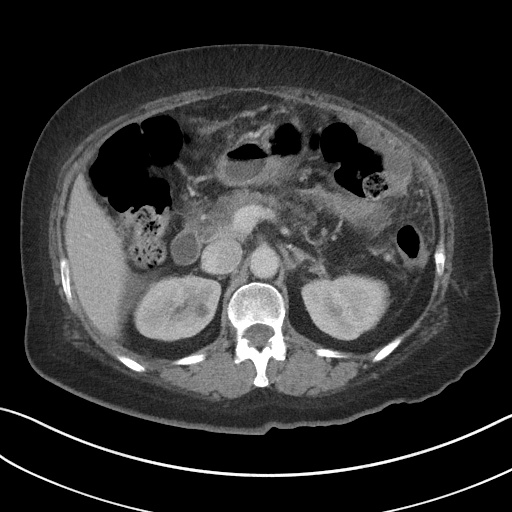
[im 65/95  bone]
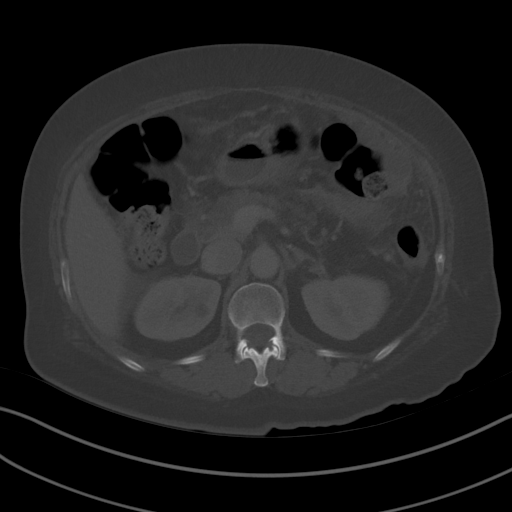
[im 75/95  soft-tissue]
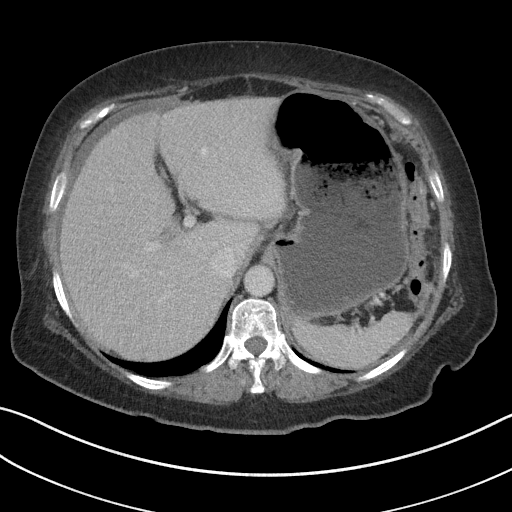
[im 80/95  soft-tissue]
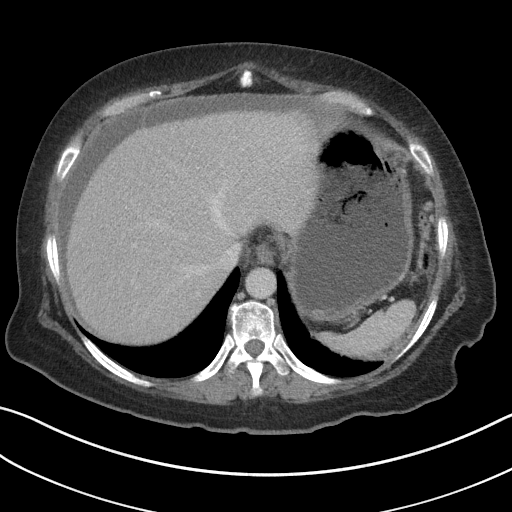
[im 90/95  soft-tissue]
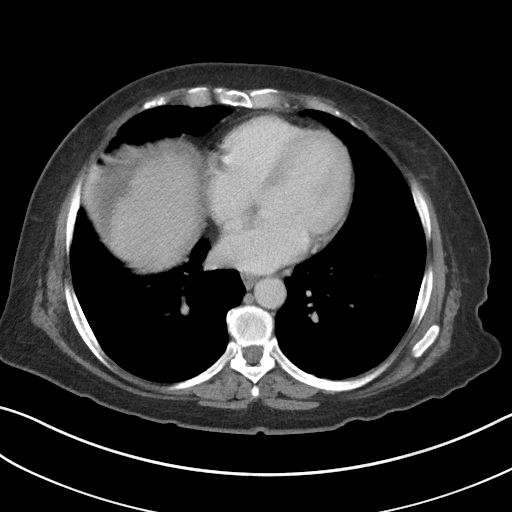

[Series 5: coronal st · coronal · 0.72mm/px · 3 of 82 slices shown]
[im 28/82  soft-tissue]
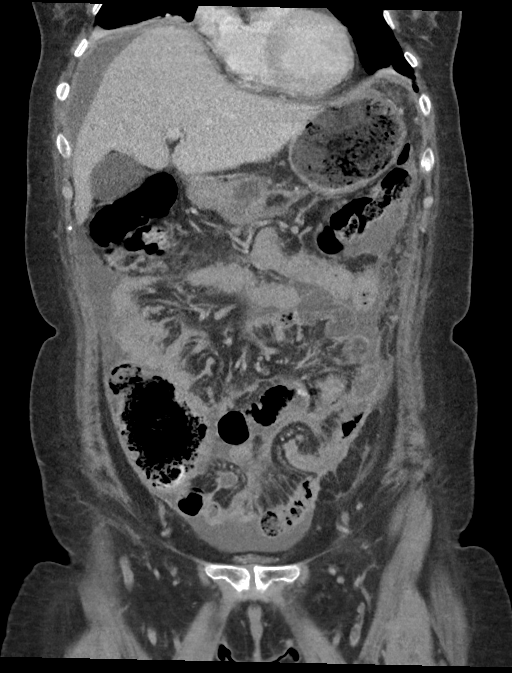
[im 37/82  soft-tissue]
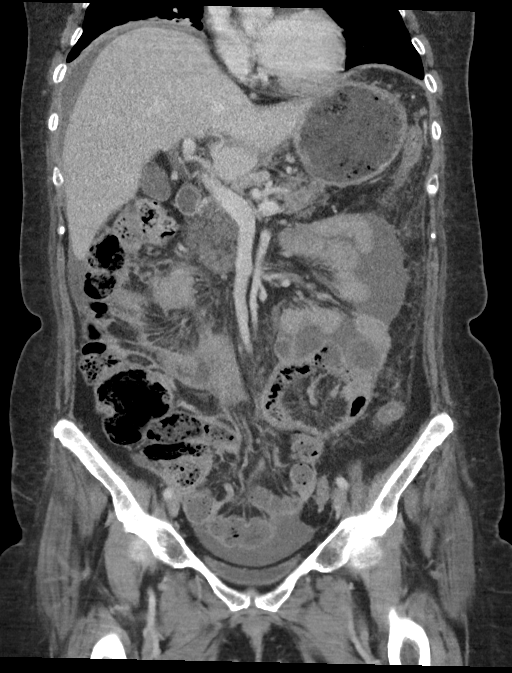
[im 46/82  soft-tissue]
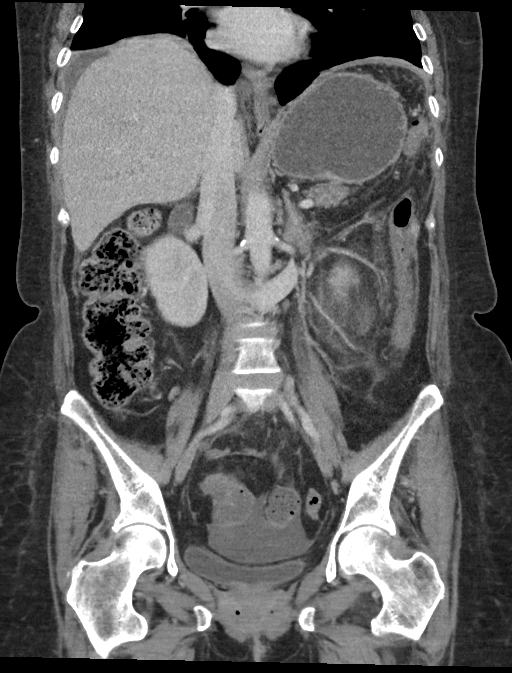

[15 of 46 positions shown; findings below may reference images not displayed]

FINDINGS: Lower chest: Lung bases are clear. Right cardiophrenic angle lymph
nodes seen on previous study are decreased in size.

Hepatobiliary: Several subcentimeter low-attenuation lesions in the
liver without change since previous study, probably cysts.
Gallbladder and bile ducts are unremarkable.

Pancreas: Unremarkable. No pancreatic ductal dilatation or
surrounding inflammatory changes.

Spleen: Normal in size without focal abnormality.

Adrenals/Urinary Tract: No adrenal gland nodules. Right renal cysts
without change. Nephrograms are otherwise homogeneous and
symmetrical. No hydronephrosis or hydroureter. Bladder wall is not
thickened. No bladder filling defects.

Stomach/Bowel: Stomach, small bowel, and colon are mostly
decompressed. Scattered stool in the colon. No wall thickening or
inflammatory changes identified.

Vascular/Lymphatic: Scattered calcification of the aorta. No
aneurysm. Scattered mesenteric and retroperitoneal lymph nodes are
mildly prominent, similar to prior study. Iliac chain pelvic lymph
nodes, most prominent on the left. These measure only about 9 mm in
short axis dimension but appear to have enlarged since previous
study. Possible to represent progression of metastatic disease.

Reproductive: Status post hysterectomy. No adnexal masses.

Other: Moderate free fluid in the abdomen and pelvis, similar to
previous study. No free air. Nodular changes in the mesentery and
omentum probably representing peritoneal implants.

Musculoskeletal: No destructive bone lesions.
IMPRESSION: 1. Moderate free fluid in the abdomen and pelvis with nodular
infiltration in the mesentery and omentum likely representing
peritoneal carcinomatosis. Similar to previous study.
2. Mild prominence of mesenteric and retroperitoneal lymph nodes,
similar to prior study. Increased prominence of pelvic lymph nodes,
possibly representing progression of metastatic disease. Right
cardiophrenic angle nodes seen on previous study are decreased in
size.
3. No evidence of bowel obstruction or inflammation.

## 2019-07-07 ENCOUNTER — Encounter: Payer: Self-pay | Admitting: Pharmacy Technician

## 2019-07-07 NOTE — Progress Notes (Signed)
Patient no longer getting Feraheme from [facility] based on need for re-enrollment. Last DOS covered is 09/04/18.

## 2019-11-17 IMAGING — US PARACENTESIS WITH ULTRASOUND GUIDANCE
1 series · 10 of 10 positions shown · non-contrast
Comparison: none

INDICATION: Patient with history of endometrial and ovarian cancers, recurrent
malignant ascites. Request made for diagnostic and therapeutic
paracentesis.

[Series 1: paracentesis with ultrasound guidance · 0.26mm/px · 10 of 10 slices shown]
[im 1/10]
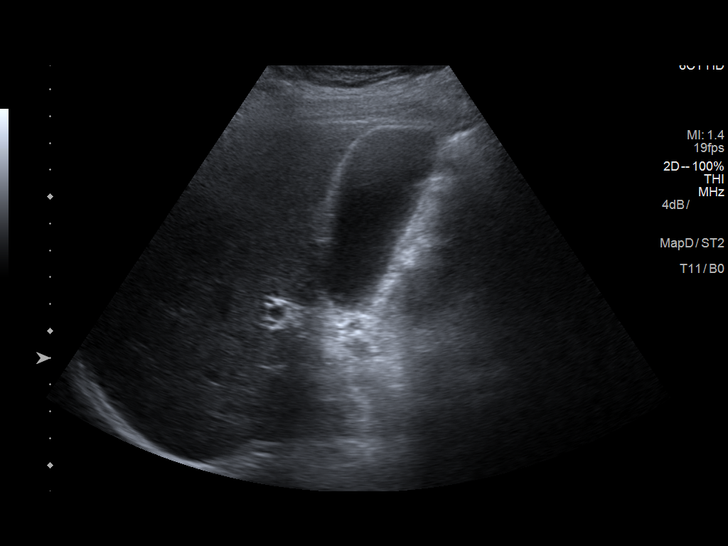
[im 2/10]
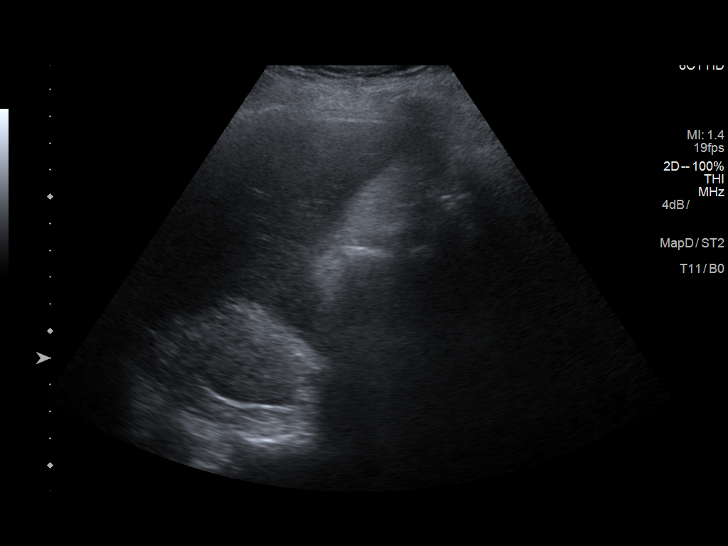
[im 3/10]
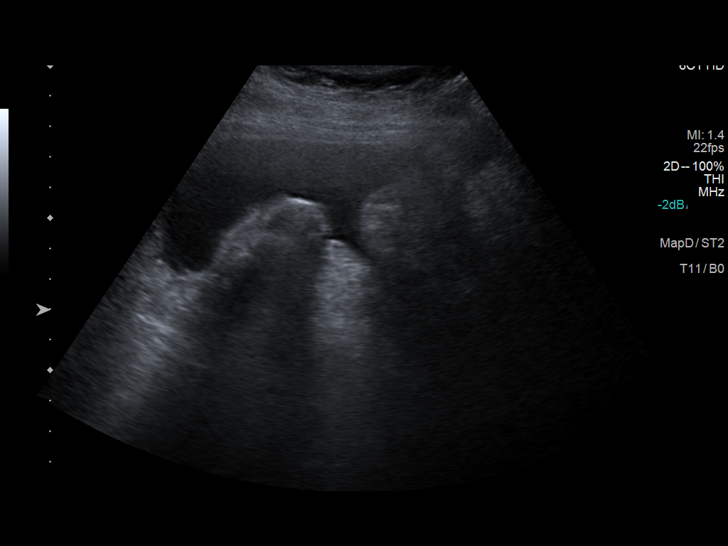
[im 4/10]
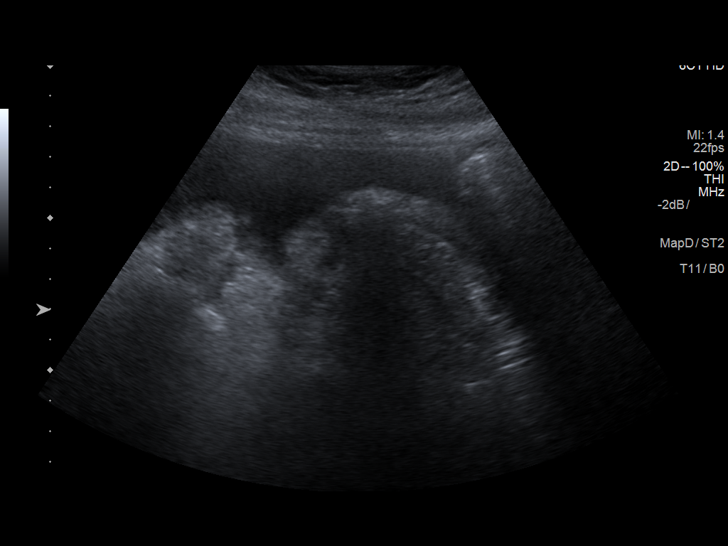
[im 5/10]
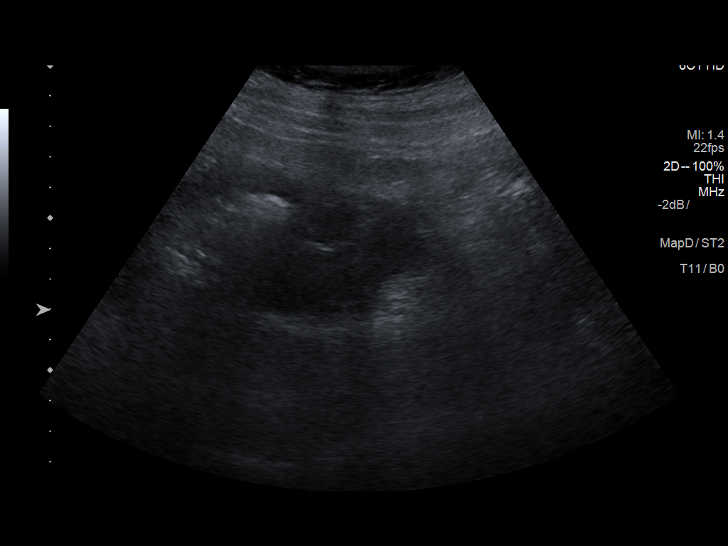
[im 6/10]
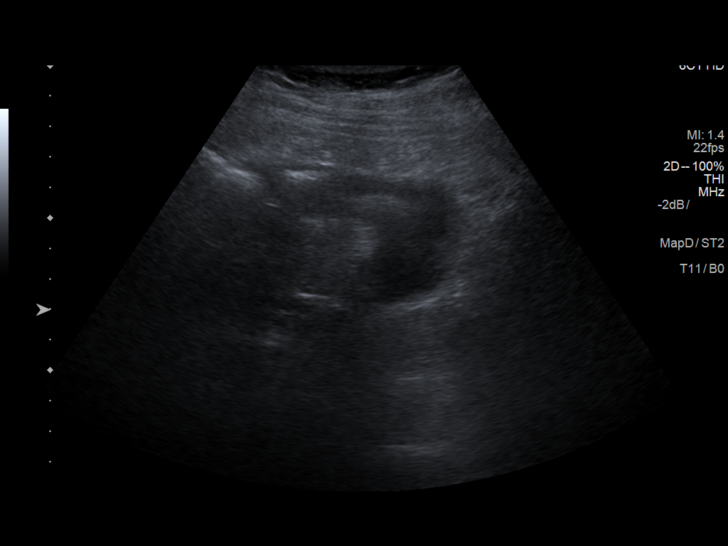
[im 7/10]
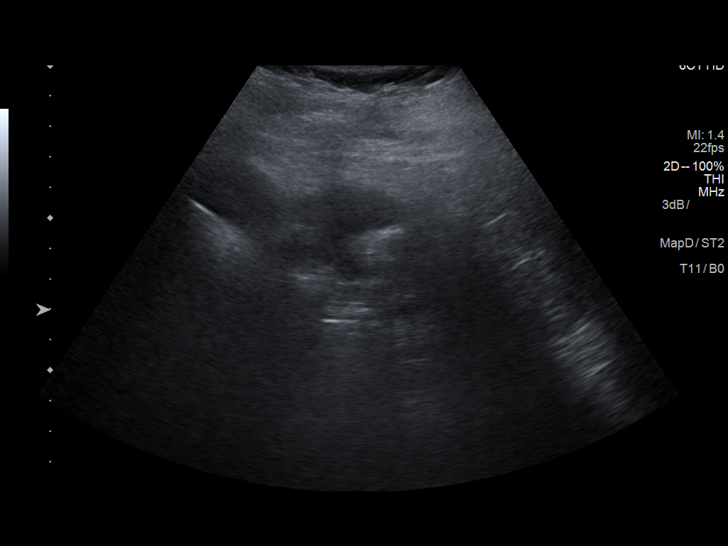
[im 8/10]
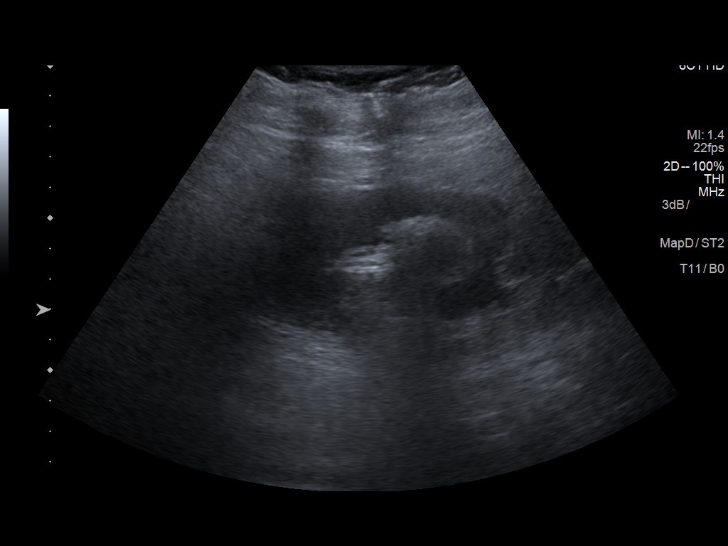
[im 9/10]
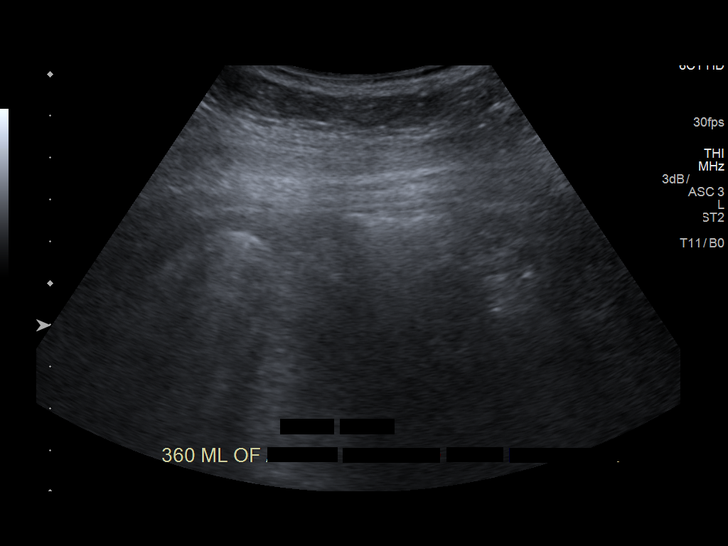
[im 10/10]
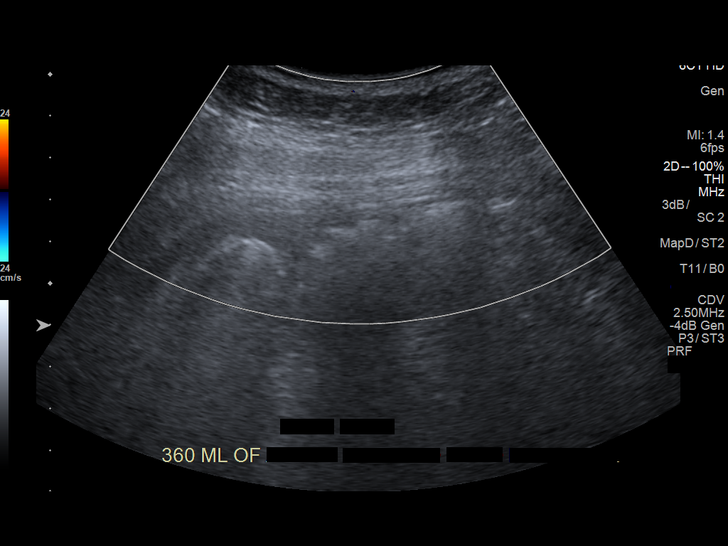

[10 of 10 positions shown; findings below may reference images not displayed]

EXAM:
ULTRASOUND GUIDED DIAGNOSTIC AND THERAPEUTIC PARACENTESIS

MEDICATIONS:
None

COMPLICATIONS:
None immediate.

PROCEDURE:
Informed written consent was obtained from the patient after a
discussion of the risks, benefits and alternatives to treatment. A
timeout was performed prior to the initiation of the procedure.

Initial ultrasound scanning demonstrates a small amount of ascites
within the right mid to lower abdominal quadrant. The right mid to
lower abdomen was prepped and draped in the usual sterile fashion.
1% lidocaine was used for local anesthesia.

Following this, a 19 gauge, 10-cm, Yueh catheter was introduced. An
ultrasound image was saved for documentation purposes. The
paracentesis was performed. The catheter was removed and a dressing
was applied. The patient tolerated the procedure well without
immediate post procedural complication.
FINDINGS: A total of approximately 360 cc of slightly hazy, yellow fluid was
removed. Samples were sent to the laboratory as requested by the
clinical team.
IMPRESSION: Successful ultrasound-guided diagnostic and therapeutic paracentesis
yielding 360 cc of peritoneal fluid.
